# Patient Record
Sex: Male | Born: 1965 | Race: White | Hispanic: No | Marital: Married | Smoking: Former smoker
Health system: Southern US, Community
[De-identification: ages and names within clinical notes are randomized; demographics above are authoritative.]

## PROBLEM LIST (undated history)

## (undated) DIAGNOSIS — G2581 Restless legs syndrome: Secondary | ICD-10-CM

## (undated) DIAGNOSIS — G4730 Sleep apnea, unspecified: Secondary | ICD-10-CM

## (undated) DIAGNOSIS — N400 Enlarged prostate without lower urinary tract symptoms: Secondary | ICD-10-CM

## (undated) DIAGNOSIS — G629 Polyneuropathy, unspecified: Secondary | ICD-10-CM

## (undated) DIAGNOSIS — K219 Gastro-esophageal reflux disease without esophagitis: Secondary | ICD-10-CM

## (undated) DIAGNOSIS — I10 Essential (primary) hypertension: Secondary | ICD-10-CM

## (undated) DIAGNOSIS — R251 Tremor, unspecified: Secondary | ICD-10-CM

## (undated) HISTORY — DX: Sleep apnea, unspecified: G47.30

## (undated) HISTORY — DX: Benign prostatic hyperplasia without lower urinary tract symptoms: N40.0

## (undated) HISTORY — DX: Tremor, unspecified: R25.1

## (undated) HISTORY — DX: Polyneuropathy, unspecified: G62.9

## (undated) HISTORY — DX: Restless legs syndrome: G25.81

## (undated) HISTORY — DX: Essential (primary) hypertension: I10

## (undated) HISTORY — PX: HERNIA REPAIR: SHX51

## (undated) HISTORY — DX: Gastro-esophageal reflux disease without esophagitis: K21.9

---

## 1997-11-12 ENCOUNTER — Emergency Department (HOSPITAL_COMMUNITY): Admission: EM | Admit: 1997-11-12 | Discharge: 1997-11-12 | Payer: Self-pay | Admitting: Emergency Medicine

## 2001-01-26 ENCOUNTER — Ambulatory Visit (HOSPITAL_COMMUNITY): Admission: RE | Admit: 2001-01-26 | Discharge: 2001-01-26 | Payer: Self-pay | Admitting: Internal Medicine

## 2001-01-26 ENCOUNTER — Encounter: Payer: Self-pay | Admitting: Internal Medicine

## 2001-02-07 ENCOUNTER — Ambulatory Visit: Admission: RE | Admit: 2001-02-07 | Discharge: 2001-02-07 | Payer: Self-pay | Admitting: Internal Medicine

## 2001-05-26 ENCOUNTER — Ambulatory Visit (HOSPITAL_COMMUNITY): Admission: RE | Admit: 2001-05-26 | Discharge: 2001-05-26 | Payer: Self-pay | Admitting: Internal Medicine

## 2002-02-12 ENCOUNTER — Encounter: Payer: Self-pay | Admitting: Internal Medicine

## 2002-02-12 ENCOUNTER — Inpatient Hospital Stay (HOSPITAL_COMMUNITY): Admission: AD | Admit: 2002-02-12 | Discharge: 2002-02-13 | Payer: Self-pay | Admitting: Internal Medicine

## 2002-04-24 ENCOUNTER — Encounter: Payer: Self-pay | Admitting: Urology

## 2002-04-24 ENCOUNTER — Ambulatory Visit (HOSPITAL_COMMUNITY): Admission: RE | Admit: 2002-04-24 | Discharge: 2002-04-24 | Payer: Self-pay | Admitting: Urology

## 2003-03-25 ENCOUNTER — Other Ambulatory Visit: Admission: RE | Admit: 2003-03-25 | Discharge: 2003-03-25 | Payer: Self-pay | Admitting: Family Medicine

## 2006-01-04 ENCOUNTER — Emergency Department (HOSPITAL_COMMUNITY): Admission: EM | Admit: 2006-01-04 | Discharge: 2006-01-04 | Payer: Self-pay | Admitting: Emergency Medicine

## 2010-07-23 ENCOUNTER — Encounter: Payer: Self-pay | Admitting: Family Medicine

## 2018-10-03 ENCOUNTER — Ambulatory Visit (INDEPENDENT_AMBULATORY_CARE_PROVIDER_SITE_OTHER): Payer: Worker's Compensation

## 2018-10-03 ENCOUNTER — Ambulatory Visit
Admission: EM | Admit: 2018-10-03 | Discharge: 2018-10-03 | Disposition: A | Discharge status: Home / Self Care | Payer: Worker's Compensation | Attending: Family Medicine | Admitting: Family Medicine

## 2018-10-03 ENCOUNTER — Encounter: Payer: Self-pay | Admitting: Emergency Medicine

## 2018-10-03 ENCOUNTER — Other Ambulatory Visit: Payer: Self-pay

## 2018-10-03 DIAGNOSIS — Z23 Encounter for immunization: Secondary | ICD-10-CM | POA: Diagnosis not present

## 2018-10-03 DIAGNOSIS — M25531 Pain in right wrist: Secondary | ICD-10-CM | POA: Diagnosis not present

## 2018-10-03 DIAGNOSIS — M79641 Pain in right hand: Secondary | ICD-10-CM

## 2018-10-03 DIAGNOSIS — S61411A Laceration without foreign body of right hand, initial encounter: Secondary | ICD-10-CM | POA: Diagnosis not present

## 2018-10-03 DIAGNOSIS — S6991XA Unspecified injury of right wrist, hand and finger(s), initial encounter: Secondary | ICD-10-CM | POA: Diagnosis not present

## 2018-10-03 MED ORDER — MELOXICAM 15 MG PO TABS: 15.0000 mg | ORAL_TABLET | Freq: Every day | ORAL | 0 refills | Status: DC | PRN

## 2018-10-03 MED ORDER — TETANUS-DIPHTH-ACELL PERTUSSIS 5-2.5-18.5 LF-MCG/0.5 IM SUSP
0.5000 mL | Freq: Once | INTRAMUSCULAR | Status: AC
  Administered 2018-10-03: 0.5 mL via INTRAMUSCULAR

## 2018-10-03 NOTE — ED Triage Notes (Signed)
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  Patient has a skin tear to his right wrist.  Patient c/o pain in his right wrist and right hand.

## 2018-10-03 NOTE — ED Provider Notes (Signed)
[facility]    CSN: [id number] Arrival date & time: 10/03/18  0807  History   Chief Complaint Chief Complaint  Patient presents with  . Extremity Laceration  . Worker's Comp Injury   HPI  53 year old male presents with an injury to his right hand.  Patient works on a chicken farm.  Patient states that he much appreciated on today after tripping over a chicken feeder.  Patient fell injuring his right hand.  He has a small skin tear to the hand.  Patient complains of hand pain as well as wrist pain.  Particularly on the radial back.  Worse with range of motion.  No relieving factors.  Patient is unsure of his last tetanus.  Bleeding is well controlled at this time.  No other associated symptoms.  No other complaints.  History reviewed as below. PMH: Obesity  Past Surgical History:  Procedure Laterality Date  . HERNIA REPAIR     Home Medications    Prior to Admission medications   Medication Sig Start Date End Date Taking? Authorizing Provider  meloxicam (MOBIC) 15 MG tablet Take 1 tablet (15 mg total) by mouth daily as needed for pain. 10/03/18   Tommie Sams, DO   Social History Social History   Tobacco Use  . Smoking status: Former Games developer  . Smokeless tobacco: Never Used  Substance Use Topics  . Alcohol use: Never    Frequency: Never  . Drug use: Never    Allergies   Patient has no known allergies.   Review of Systems Review of Systems  Musculoskeletal:       Hand and wrist pain.   Skin:       Skin tear.   Physical Exam Triage Vital Signs ED Triage Vitals  Enc Vitals Group     BP 10/03/18 0825 (!) 127/91     Pulse Rate 10/03/18 0825 62     Resp 10/03/18 0825 16     Temp 10/03/18 0825 98 F (36.7 C)     Temp Source 10/03/18 0825 Oral     SpO2 10/03/18 0825 99 %     Weight 10/03/18 0822 270 lb (122.5 kg)     Height 10/03/18 0822 5' 11.5" (1.816 m)     Head Circumference --      Peak Flow --      Pain Score 10/03/18 0822 8     Pain Loc --       Pain Edu? --      Excl. in GC? --    Updated Vital Signs BP (!) 127/91 (BP Location: Left Arm)   Pulse 62   Temp 98 F (36.7 C) (Oral)   Resp 16   Ht 5' 11.5" (1.816 m)   Wt 122.5 kg   SpO2 99%   BMI 37.13 kg/m   Visual Acuity Right Eye Distance:   Left Eye Distance:   Bilateral Distance:    Right Eye Near:   Left Eye Near:    Bilateral Near:     Physical Exam Vitals signs and nursing note reviewed.  Constitutional:      General: He is not in acute distress.    Appearance: Normal appearance. He is obese.  HENT:     Head: Normocephalic and atraumatic.  Eyes:     General:        Right eye: No discharge.        Left eye: No discharge.     Conjunctiva/sclera: Conjunctivae normal.  Cardiovascular:  Rate and Rhythm: Normal rate and regular rhythm.  Pulmonary:     Effort: Pulmonary effort is normal.     Breath sounds: Normal breath sounds.  Musculoskeletal:     Comments: Right hand -mild swelling and tenderness over the second and third MCP.  Right wrist -tenderness and mild swelling on the radial aspect.  Skin:    Comments: Small skin tear noted on the dorsum of the right hand.  Neurological:     Mental Status: He is alert.  Psychiatric:        Mood and Affect: Mood normal.        Behavior: Behavior normal.    UC Treatments / Results  Labs (all labs ordered are listed, but only abnormal results are displayed) Labs Reviewed - No data to display  EKG None  Radiology Dg Wrist Complete Right  Result Date: 10/03/2018 CLINICAL DATA:  53 year old male with a history fall EXAM: RIGHT WRIST - COMPLETE 3+ VIEW COMPARISON:  No prior FINDINGS: Vague calcific density adjacent to the distal radius in a region of soft tissue swelling. No ulnar fracture identified. No radiopaque foreign body. Unremarkable scaphoid. IMPRESSION: Questionable avulsion fracture/chip fracture at the distal radius with associated soft tissue swelling. Electronically Signed   By: Gilmer Mor D.O.   On: 10/03/2018 08:57   Dg Hand Complete Right  Result Date: 10/03/2018 CLINICAL DATA:  Fall today with right hand injury EXAM: RIGHT HAND - COMPLETE 3+ VIEW COMPARISON:  None. FINDINGS: Soft tissue swelling in the radial side right wrist. No fracture or dislocation in the right hand. No suspicious focal osseous lesions. No significant arthropathy. No radiopaque foreign body. IMPRESSION: Right wrist radial side soft tissue swelling. No fracture or dislocation in the right hand. Electronically Signed   By: Delbert Phenix M.D.   On: 10/03/2018 09:00    Procedures Procedures (including critical care time)  Medications Ordered in UC Medications  Tdap (BOOSTRIX) injection 0.5 mL (0.5 mLs Intramuscular Given 10/03/18 9244)    Initial Impression / Assessment and Plan / UC Course  I have reviewed the triage vital signs and the nursing notes.  Pertinent labs & imaging results that were available during my care of the patient were reviewed by me and considered in my medical decision making (see chart for details).    53 year old male presents following an injury at work today.  Regarding his skin tear, the wound was dressed.  No need for further intervention.  X-rays of the hand and wrist revealed a possible avulsion fracture of the distal radius.  Placed in a brace.  Treating with meloxicam.  Advised rest, ice, elevation.  Discussed seeing orthopedist.  Workmen's Comp. form filled out.  Out of work today.  Final Clinical Impressions(s) / UC Diagnoses   Final diagnoses:  Injury of right wrist, initial encounter  Right wrist pain  Right hand pain  Skin tear of right hand without complication, initial encounter     Discharge Instructions     Rest, ice, elevation.  Wrist brace.  Medication as prescribed.  See Orthopedics on Monday ([facility] or [place]).  Take care  Dr. Adriana Simas     ED Prescriptions    Medication Sig Dispense Auth. Provider   meloxicam (MOBIC) 15 MG  tablet Take 1 tablet (15 mg total) by mouth daily as needed for pain. 30 tablet Tommie Sams, DO     Controlled Substance Prescriptions [place] Controlled Substance Registry consulted? Not Applicable   Tommie Sams, DO 10/03/18  0928  

## 2018-10-03 NOTE — Discharge Instructions (Signed)
Rest, ice, elevation.  Wrist brace.  Medication as prescribed.  See Orthopedics on Monday ([facility] or [place]).  Take care  Dr. Adriana Simas

## 2019-07-29 ENCOUNTER — Other Ambulatory Visit: Payer: Self-pay

## 2019-07-30 ENCOUNTER — Ambulatory Visit (INDEPENDENT_AMBULATORY_CARE_PROVIDER_SITE_OTHER): Payer: 59 | Admitting: Family Medicine

## 2019-07-30 ENCOUNTER — Encounter: Payer: Self-pay | Admitting: Family Medicine

## 2019-07-30 VITALS — BP 140/92 | HR 71 | Temp 98.6°F | Wt 315.4 lb

## 2019-07-30 DIAGNOSIS — R6889 Other general symptoms and signs: Secondary | ICD-10-CM

## 2019-07-30 DIAGNOSIS — Z23 Encounter for immunization: Secondary | ICD-10-CM

## 2019-07-30 DIAGNOSIS — G629 Polyneuropathy, unspecified: Secondary | ICD-10-CM

## 2019-07-30 DIAGNOSIS — Z1211 Encounter for screening for malignant neoplasm of colon: Secondary | ICD-10-CM

## 2019-07-30 DIAGNOSIS — G2581 Restless legs syndrome: Secondary | ICD-10-CM

## 2019-07-30 DIAGNOSIS — I10 Essential (primary) hypertension: Secondary | ICD-10-CM | POA: Diagnosis not present

## 2019-07-30 DIAGNOSIS — Z9989 Dependence on other enabling machines and devices: Secondary | ICD-10-CM | POA: Insufficient documentation

## 2019-07-30 DIAGNOSIS — R519 Headache, unspecified: Secondary | ICD-10-CM

## 2019-07-30 DIAGNOSIS — Z130 Encounter for screening for diseases of the blood and blood-forming organs and certain disorders involving the immune mechanism: Secondary | ICD-10-CM

## 2019-07-30 DIAGNOSIS — G4733 Obstructive sleep apnea (adult) (pediatric): Secondary | ICD-10-CM

## 2019-07-30 DIAGNOSIS — N400 Enlarged prostate without lower urinary tract symptoms: Secondary | ICD-10-CM | POA: Diagnosis not present

## 2019-07-30 DIAGNOSIS — G4730 Sleep apnea, unspecified: Secondary | ICD-10-CM | POA: Insufficient documentation

## 2019-07-30 DIAGNOSIS — Z13228 Encounter for screening for other metabolic disorders: Secondary | ICD-10-CM

## 2019-07-30 DIAGNOSIS — R32 Unspecified urinary incontinence: Secondary | ICD-10-CM

## 2019-07-30 DIAGNOSIS — K5900 Constipation, unspecified: Secondary | ICD-10-CM

## 2019-07-30 DIAGNOSIS — K219 Gastro-esophageal reflux disease without esophagitis: Secondary | ICD-10-CM

## 2019-07-30 MED ORDER — PRAMIPEXOLE DIHYDROCHLORIDE 0.25 MG PO TABS: 0.2500 mg | ORAL_TABLET | Freq: Every evening | ORAL | 2 refills | Status: DC

## 2019-07-30 MED ORDER — HYDROCHLOROTHIAZIDE 25 MG PO TABS: 25.0000 mg | ORAL_TABLET | Freq: Every day | ORAL | 2 refills | Status: DC

## 2019-07-30 MED ORDER — SHINGRIX 50 MCG/0.5ML IM SUSR: 0.5000 mL | Freq: Once | INTRAMUSCULAR | 0 refills | Status: AC

## 2019-07-30 NOTE — Patient Instructions (Addendum)
Mirapex 0.125 mg (half tablet) x1 week, then if needed increase to 0.25 mg (1 tablet) x1 week, then if needed, increase to 0.5 mg (2 tablets)   GoodRx for prescription assistance.    DASH Eating Plan DASH stands for "Dietary Approaches to Stop Hypertension." The DASH eating plan is a healthy eating plan that has been shown to reduce high blood pressure (hypertension). It may also reduce your risk for type 2 diabetes, heart disease, and stroke. The DASH eating plan may also help with weight loss. What are tips for following this plan?  General guidelines  Avoid eating more than 2,300 mg (milligrams) of salt (sodium) a day. If you have hypertension, you may need to reduce your sodium intake to 1,500 mg a day.  Limit alcohol intake to no more than 1 drink a day for nonpregnant women and 2 drinks a day for men. One drink equals 12 oz of beer, 5 oz of wine, or 1 oz of hard liquor.  Work with your health care provider to maintain a healthy body weight or to lose weight. Ask what an ideal weight is for you.  Get at least 30 minutes of exercise that causes your heart to beat faster (aerobic exercise) most days of the week. Activities may include walking, swimming, or biking.  Work with your health care provider or diet and nutrition specialist (dietitian) to adjust your eating plan to your individual calorie needs. Reading food labels   Check food labels for the amount of sodium per serving. Choose foods with less than 5 percent of the Daily Value of sodium. Generally, foods with less than 300 mg of sodium per serving fit into this eating plan.  To find whole grains, look for the word "whole" as the first word in the ingredient list. Shopping  Buy products labeled as "low-sodium" or "no salt added."  Buy fresh foods. Avoid canned foods and premade or frozen meals. Cooking  Avoid adding salt when cooking. Use salt-free seasonings or herbs instead of table salt or sea salt. Check with your  health care provider or pharmacist before using salt substitutes.  Do not fry foods. Cook foods using healthy methods such as baking, boiling, grilling, and broiling instead.  Cook with heart-healthy oils, such as olive, canola, soybean, or sunflower oil. Meal planning  Eat a balanced diet that includes: ? 5 or more servings of fruits and vegetables each day. At each meal, try to fill half of your plate with fruits and vegetables. ? Up to 6-8 servings of whole grains each day. ? Less than 6 oz of lean meat, poultry, or fish each day. A 3-oz serving of meat is about the same size as a deck of cards. One egg equals 1 oz. ? 2 servings of low-fat dairy each day. ? A serving of nuts, seeds, or beans 5 times each week. ? Heart-healthy fats. Healthy fats called Omega-3 fatty acids are found in foods such as flaxseeds and coldwater fish, like sardines, salmon, and mackerel.  Limit how much you eat of the following: ? Canned or prepackaged foods. ? Food that is high in trans fat, such as fried foods. ? Food that is high in saturated fat, such as fatty meat. ? Sweets, desserts, sugary drinks, and other foods with added sugar. ? Full-fat dairy products.  Do not salt foods before eating.  Try to eat at least 2 vegetarian meals each week.  Eat more home-cooked food and less restaurant, buffet, and fast food.  When  eating at a restaurant, ask that your food be prepared with less salt or no salt, if possible. What foods are recommended? The items listed may not be a complete list. Talk with your dietitian about what dietary choices are best for you. Grains Whole-grain or whole-wheat bread. Whole-grain or whole-wheat pasta. Brown rice. [place]. Bulgur. Whole-grain and low-sodium cereals. Pita bread. Low-fat, low-sodium crackers. Whole-wheat flour tortillas. Vegetables Fresh or frozen vegetables (raw, steamed, roasted, or grilled). Low-sodium or reduced-sodium tomato and vegetable juice.  Low-sodium or reduced-sodium tomato sauce and tomato paste. Low-sodium or reduced-sodium canned vegetables. Fruits All fresh, dried, or frozen fruit. Canned fruit in natural juice (without added sugar). Meat and other protein foods Skinless chicken or [place]. Ground chicken or [place]. Pork with fat trimmed off. Fish and seafood. Egg whites. Dried beans, peas, or lentils. Unsalted nuts, nut butters, and seeds. Unsalted canned beans. Lean cuts of beef with fat trimmed off. Low-sodium, lean deli meat. Dairy Low-fat (1%) or fat-free (skim) milk. Fat-free, low-fat, or reduced-fat cheeses. Nonfat, low-sodium ricotta or cottage cheese. Low-fat or nonfat yogurt. Low-fat, low-sodium cheese. Fats and oils Soft margarine without trans fats. Vegetable oil. Low-fat, reduced-fat, or light mayonnaise and salad dressings (reduced-sodium). Canola, safflower, olive, soybean, and sunflower oils. Avocado. Seasoning and other foods Herbs. Spices. Seasoning mixes without salt. Unsalted popcorn and pretzels. Fat-free sweets. What foods are not recommended? The items listed may not be a complete list. Talk with your dietitian about what dietary choices are best for you. Grains Baked goods made with fat, such as croissants, muffins, or some breads. Dry pasta or rice meal packs. Vegetables Creamed or fried vegetables. Vegetables in a cheese sauce. Regular canned vegetables (not low-sodium or reduced-sodium). Regular canned tomato sauce and paste (not low-sodium or reduced-sodium). Regular tomato and vegetable juice (not low-sodium or reduced-sodium). Angie Fava. Olives. Fruits Canned fruit in a light or heavy syrup. Fried fruit. Fruit in cream or butter sauce. Meat and other protein foods Fatty cuts of meat. Ribs. Fried meat. Berniece Salines. Sausage. Bologna and other processed lunch meats. Salami. Fatback. Hotdogs. Bratwurst. Salted nuts and seeds. Canned beans with added salt. Canned or smoked fish. Whole eggs or egg yolks. Chicken  or [place] with skin. Dairy Whole or 2% milk, cream, and half-and-half. Whole or full-fat cream cheese. Whole-fat or sweetened yogurt. Full-fat cheese. Nondairy creamers. Whipped toppings. Processed cheese and cheese spreads. Fats and oils Butter. Stick margarine. Lard. Shortening. Ghee. Bacon fat. Tropical oils, such as coconut, palm kernel, or palm oil. Seasoning and other foods Salted popcorn and pretzels. Onion salt, garlic salt, seasoned salt, table salt, and sea salt. Worcestershire sauce. Tartar sauce. Barbecue sauce. Teriyaki sauce. Soy sauce, including reduced-sodium. Steak sauce. Canned and packaged gravies. Fish sauce. Oyster sauce. Cocktail sauce. Horseradish that you find on the shelf. Ketchup. Mustard. Meat flavorings and tenderizers. Bouillon cubes. Hot sauce and Tabasco sauce. Premade or packaged marinades. Premade or packaged taco seasonings. Relishes. Regular salad dressings. Where to find more information:  National Heart, Lung, and [place]: https://wilson-eaton.com/  American Heart Association: www.heart.org Summary  The DASH eating plan is a healthy eating plan that has been shown to reduce high blood pressure (hypertension). It may also reduce your risk for type 2 diabetes, heart disease, and stroke.  With the DASH eating plan, you should limit salt (sodium) intake to 2,300 mg a day. If you have hypertension, you may need to reduce your sodium intake to 1,500 mg a day.  When on the DASH eating plan, aim  to eat more fresh fruits and vegetables, whole grains, lean proteins, low-fat dairy, and heart-healthy fats.  Work with your health care provider or diet and nutrition specialist (dietitian) to adjust your eating plan to your individual calorie needs. This information is not intended to replace advice given to you by your health care provider. Make sure you discuss any questions you have with your health care provider. Document Revised: 05/31/2017 Document Reviewed:  06/11/2016 Elsevier Patient Education  2020 Elsevier Inc.   Restless Legs Syndrome Restless legs syndrome is a condition that causes uncomfortable feelings or sensations in the legs, especially while sitting or lying down. The sensations usually cause an overwhelming urge to move the legs. The arms can also sometimes be affected. The condition can range from mild to severe. The symptoms often interfere with a person's ability to sleep. What are the causes? The cause of this condition is not known. What increases the risk? The following factors may make you more likely to develop this condition:  Being older than 50.  Pregnancy.  Being a woman. In general, the condition is more common in women than in men.  A family history of the condition.  Having iron deficiency.  Overuse of caffeine, nicotine, or alcohol.  Certain medical conditions, such as kidney disease, Parkinson's disease, or nerve damage.  Certain medicines, such as those for high blood pressure, nausea, colds, allergies, depression, and some heart conditions. What are the signs or symptoms? The main symptom of this condition is uncomfortable sensations in the legs, such as:  Pulling.  Tingling.  Prickling.  Throbbing.  Crawling.  Burning. Usually, the sensations:  Affect both sides of the body.  Are worse when you sit or lie down.  Are worse at night. These may wake you up or make it difficult to fall asleep.  Make you have a strong urge to move your legs.  Are temporarily relieved by moving your legs. The arms can also be affected, but this is rare. People who have this condition often have tiredness during the day because of their lack of sleep at night. How is this diagnosed? This condition may be diagnosed based on:  Your symptoms.  Blood tests. In some cases, you may be monitored in a sleep lab by a specialist (a sleep study). This can detect any disruptions in your sleep. How is this  treated? This condition is treated by managing the symptoms. This may include:  Lifestyle changes, such as exercising, using relaxation techniques, and avoiding caffeine, alcohol, or tobacco.  Medicines. Anti-seizure medicines may be tried first. Follow these instructions at home:     General instructions  Take over-the-counter and prescription medicines only as told by your health care provider.  Use methods to help relieve the uncomfortable sensations, such as: ? Massaging your legs. ? Walking or stretching. ? Taking a cold or hot bath.  Keep all follow-up visits as told by your health care provider. This is important. Lifestyle  Practice good sleep habits. For example, go to bed and get up at the same time every day. Most adults should get 7-9 hours of sleep each night.  Exercise regularly. Try to get at least 30 minutes of exercise most days of the week.  Practice ways of relaxing, such as yoga or meditation.  Avoid caffeine and alcohol.  Do not use any products that contain nicotine or tobacco, such as cigarettes and e-cigarettes. If you need help quitting, ask your health care provider. Contact a health care provider  if:  Your symptoms get worse or they do not improve with treatment. Summary  Restless legs syndrome is a condition that causes uncomfortable feelings or sensations in the legs, especially while sitting or lying down.  The symptoms often interfere with a person's ability to sleep.  This condition is treated by managing the symptoms. You may need to make lifestyle changes or take medicines. This information is not intended to replace advice given to you by your health care provider. Make sure you discuss any questions you have with your health care provider. Document Revised: 07/08/2017 Document Reviewed: 07/08/2017 Elsevier Patient Education  [place].

## 2019-07-30 NOTE — Progress Notes (Signed)
New Patient Office Visit  Assessment & Plan:  1. Restless leg - Uncontrolled. Starting patient on Mirapex 0.125 mg at bedtime. He may increase to 0.25 mg after one week if needed, and then up to 0.5 mg after another week if needed. This was printed on his AVS for him.  Education provided on restless leg syndrome. - pramipexole (MIRAPEX) 0.25 MG tablet; Take 1 tablet (0.25 mg total) by mouth every evening.  Dispense: 30 tablet; Refill: 2  2. Essential hypertension - Mildly elevated but patient has a lot of swelling. Starting him on HCTZ to decrease fluid and BP. Encouraged low salt. Education provided on the DASH diet. - hydrochlorothiazide (HYDRODIURIL) 25 MG tablet; Take 1 tablet (25 mg total) by mouth daily.  Dispense: 30 tablet; Refill: 2  3. Neuropathy - Will address at another visit as patient has multiple complaints today.  - CMP14+EGFR  4. Enlarged prostate - PSA, total and free  5. Obstructive sleep apnea syndrome - Controlled with use of CPAP.   6. Morbid obesity ([place]) - Encouraged diet and exercise.  - CBC with Differential/Platelet - CMP14+EGFR - Lipid panel  7. Colon cancer screening - Cologuard  8. Screening for deficiency anemia - CBC with Differential/Platelet  9. Screening for metabolic disorder - WOE32+ZYYQ  10. Immunization due - SHINGRIX injection; Inject 0.5 mLs into the muscle once for 1 dose.  Dispense: 0.5 mL; Refill: 0 - sent to pharmacy for administration.   11. Need for immunization against influenza - Flu Vaccine QUAD 36+ mos IM - given in office.   12. Multiple complaints - Advised we cannot address all complaints on the first visit.   13. Morning headache - Will address at another visit as patient has multiple complaints today.   14. Urinary incontinence, unspecified type - Will address at another visit as patient has multiple complaints today.   15. Constipation, unspecified constipation type - Patient to continue current probiotic  since it is working for him. Encouraged him to increase water intake.   16. Gastroesophageal reflux disease, unspecified whether esophagitis present - Continue famotidine 40 mg BID. Encouraged dietary adjustments and weight loss.  - famotidine (PEPCID) 20 MG tablet; Take 40 mg by mouth 2 (two) times daily.   Follow-up: Return in about 6 weeks (around 09/10/2019) for RLS.   Hendricks Limes, MSN, APRN, FNP-C [facility]  Subjective:  Patient ID: Jesse Castillo, Jesse Castillo    DOB: 08-19-1965  Age: 54 y.o. MRN: [id number]  Patient Care Team: Loman Brooklyn, FNP as PCP - General (Family Medicine)  CC:  Chief Complaint  Patient presents with  . New Patient (Initial Visit)    last 2 years was in prison. Restless legs he use to be on meds. right foot goes numb was diagnosed with neuropothy in prison, strength in hands  . Gastroesophageal Reflux  . Insomnia    HPI Jesse Castillo presents to establish care. Patient reports he used to see Dr. Laverta Baltimore here many years ago but he has been in prison for the past 12-1/2 years.    Patient's biggest concern is his restless leg syndrome.  He reports he used to be on medication but does not recall what it was called.  Reports his legs just will not allow him to sit still when he gets home from work in the evening.  Due to his legs he also does not sleep well.  He does have sleep apnea and wears his CPAP nightly.  Patient  reports he was diagnosed with neuropathy by a neurologist while he was in prison.  Reports numbness to the outside of his right foot, right great toe, and the bottom of his right foot.  Also reports a stabbing pain in the arch of his right foot at times.  Reports waking up with a dull headache every morning.  Concerned about swelling in his hands and feet.  Reports swelling get so bad in his hands that he cannot even ball a fist.  Hands are also very stiff in the morning.  Patient reports he eats very little salt but then  endorses using half salt substitute, and eating things with high salt content such as chips and peanuts.  He does not buy low-sodium foods.  Concerned that he leaks urine during the day.  He feels he is unable to completely empty his bladder.  Also struggles with constipation.  In the past he has failed therapy with MiraLAX, fiber tablets, and Metamucil.  He is currently taking a "colon cleanser" which he reports is working well for him.  Patient also has GERD which he feels is somewhat controlled with famotidine 40 mg twice daily.  Patient indulges in food on a regular basis as he reports it is a huge change from being only able to eat small portions of nasty food in prison.  Patient does check his blood pressure at home with a wrist cuff and reports he gets around 138/80s.   Review of Systems  Constitutional: Negative for chills, fever, malaise/fatigue and weight loss.  HENT: Negative for congestion, ear discharge, ear pain, nosebleeds, sinus pain, sore throat and tinnitus.   Eyes: Negative for blurred vision, double vision, pain, discharge and redness.  Respiratory: Positive for shortness of breath (with climbing stairs). Negative for cough and wheezing.   Cardiovascular: Positive for leg swelling. Negative for chest pain and palpitations.  Gastrointestinal: Positive for constipation and heartburn. Negative for abdominal pain, diarrhea, nausea and vomiting.  Genitourinary: Negative for dysuria, frequency and urgency.  Musculoskeletal: Negative for myalgias.  Skin: Negative for rash.  Neurological: Positive for headaches. Negative for dizziness, seizures and weakness.  Psychiatric/Behavioral: Negative for depression, substance abuse and suicidal ideas. The patient is not nervous/anxious.     Current Outpatient Medications:  .  aspirin EC 81 MG tablet, Take 81 mg by mouth daily., Disp: , Rfl:  .  famotidine (PEPCID) 20 MG tablet, Take 40 mg by mouth 2 (two) times daily., Disp: , Rfl:  .   Misc Natural Products (URINOZINC PLUS PO), Take by mouth., Disp: , Rfl:  .  Multiple Vitamins-Minerals (MULTIVITAMIN MEN PO), Take by mouth., Disp: , Rfl:  .  Probiotic Product (HEALTHY COLON PO), Take by mouth., Disp: , Rfl:  .  Specialty Vitamins Products (ECHINACEA C COMPLETE PO), Take by mouth., Disp: , Rfl:  .  hydrochlorothiazide (HYDRODIURIL) 25 MG tablet, Take 1 tablet (25 mg total) by mouth daily., Disp: 30 tablet, Rfl: 2 .  pramipexole (MIRAPEX) 0.25 MG tablet, Take 1 tablet (0.25 mg total) by mouth every evening., Disp: 30 tablet, Rfl: 2  No Known Allergies  Past Medical History:  Diagnosis Date  . Enlarged prostate   . Neuropathy    RLE  . Restless leg   . Sleep apnea     Past Surgical History:  Procedure Laterality Date  . HERNIA REPAIR      Family History  Problem Relation Age of Onset  . Heart disease Mother   . Tremor Father   .  Hypertension Daughter   . Heart disease Maternal Grandmother   . Parkinson's disease Paternal Grandmother     Social History   Socioeconomic History  . Marital status: Married    Spouse name: Not on file  . Number of children: Not on file  . Years of education: Not on file  . Highest education level: Not on file  Occupational History  . Not on file  Tobacco Use  . Smoking status: Former Smoker    Quit date: 07/03/2007    Years since quitting: 12.0  . Smokeless tobacco: Never Used  Substance and Sexual Activity  . Alcohol use: Never  . Drug use: Never  . Sexual activity: Not on file  Other Topics Concern  . Not on file  Social History Narrative  . Not on file   Social Determinants of Health   Financial Resource Strain:   . Difficulty of Paying Living Expenses: Not on file  Food Insecurity:   . Worried About Charity fundraiser in the Last Year: Not on file  . Ran Out of Food in the Last Year: Not on file  Transportation Needs:   . Lack of Transportation (Medical): Not on file  . Lack of Transportation  (Non-Medical): Not on file  Physical Activity:   . Days of Exercise per Week: Not on file  . Minutes of Exercise per Session: Not on file  Stress:   . Feeling of Stress : Not on file  Social Connections:   . Frequency of Communication with Friends and Family: Not on file  . Frequency of Social Gatherings with Friends and Family: Not on file  . Attends Religious Services: Not on file  . Active Member of Clubs or Organizations: Not on file  . Attends Archivist Meetings: Not on file  . Marital Status: Not on file  Intimate Partner Violence:   . Fear of Current or Ex-Partner: Not on file  . Emotionally Abused: Not on file  . Physically Abused: Not on file  . Sexually Abused: Not on file    Objective:   Today's Vitals: BP (!) 140/92   Pulse 71   Temp 98.6 F (37 C)   Wt (!) 315 lb 6.4 oz (143.1 kg)   SpO2 96%   BMI 43.38 kg/m   Physical Exam Vitals reviewed.  Constitutional:      General: He is not in acute distress.    Appearance: Normal appearance. He is morbidly obese. He is not ill-appearing, toxic-appearing or diaphoretic.  HENT:     Head: Normocephalic and atraumatic.  Eyes:     General: No scleral icterus.       Right eye: No discharge.        Left eye: No discharge.     Conjunctiva/sclera: Conjunctivae normal.  Cardiovascular:     Rate and Rhythm: Normal rate and regular rhythm.     Heart sounds: Normal heart sounds. No murmur. No friction rub. No gallop.   Pulmonary:     Effort: Pulmonary effort is normal. No respiratory distress.     Breath sounds: Normal breath sounds. No stridor. No wheezing, rhonchi or rales.  Musculoskeletal:        General: Normal range of motion.     Cervical back: Normal range of motion.  Skin:    General: Skin is warm and dry.  Neurological:     Mental Status: He is alert and oriented to person, place, and time. Mental status is at baseline.  Psychiatric:  Mood and Affect: Mood normal.        Behavior: Behavior  normal.        Thought Content: Thought content normal.        Judgment: Judgment normal.

## 2019-07-31 ENCOUNTER — Encounter: Payer: Self-pay | Admitting: Family Medicine

## 2019-07-31 LAB — CMP14+EGFR
ALT: 20 IU/L (ref 0–44)
AST: 27 IU/L (ref 0–40)
Albumin/Globulin Ratio: 1.7 (ref 1.2–2.2)
Albumin: 4.5 g/dL (ref 3.8–4.9)
Alkaline Phosphatase: 91 IU/L (ref 39–117)
BUN/Creatinine Ratio: 18 (ref 9–20)
BUN: 16 mg/dL (ref 6–24)
Bilirubin Total: 0.6 mg/dL (ref 0.0–1.2)
CO2: 25 mmol/L (ref 20–29)
Calcium: 9.7 mg/dL (ref 8.7–10.2)
Chloride: 104 mmol/L (ref 96–106)
Creatinine, Ser: 0.87 mg/dL (ref 0.76–1.27)
GFR calc Af Amer: 116 mL/min/{1.73_m2} (ref >59)
GFR calc non Af Amer: 100 mL/min/{1.73_m2} (ref >59)
Globulin, Total: 2.7 g/dL (ref 1.5–4.5)
Glucose: 97 mg/dL (ref 65–99)
Potassium: 4.5 mmol/L (ref 3.5–5.2)
Sodium: 140 mmol/L (ref 134–144)
Total Protein: 7.2 g/dL (ref 6.0–8.5)

## 2019-07-31 LAB — CBC WITH DIFFERENTIAL/PLATELET
Basophils Absolute: 0 x10E3/uL (ref 0.0–0.2)
Basos: 1 %
EOS (ABSOLUTE): 0.1 x10E3/uL (ref 0.0–0.4)
Eos: 1 %
Hematocrit: 44 % (ref 37.5–51.0)
Hemoglobin: 15.4 g/dL (ref 13.0–17.7)
Immature Grans (Abs): 0 x10E3/uL (ref 0.0–0.1)
Immature Granulocytes: 0 %
Lymphocytes Absolute: 1.6 x10E3/uL (ref 0.7–3.1)
Lymphs: 25 %
MCH: 31.7 pg (ref 26.6–33.0)
MCHC: 35 g/dL (ref 31.5–35.7)
MCV: 91 fL (ref 79–97)
Monocytes Absolute: 1 x10E3/uL — ABNORMAL HIGH (ref 0.1–0.9)
Monocytes: 16 %
Neutrophils Absolute: 3.6 x10E3/uL (ref 1.4–7.0)
Neutrophils: 57 %
Platelets: 254 x10E3/uL (ref 150–450)
RBC: 4.86 x10E6/uL (ref 4.14–5.80)
RDW: 12.6 % (ref 11.6–15.4)
WBC: 6.3 x10E3/uL (ref 3.4–10.8)

## 2019-07-31 LAB — LIPID PANEL
Chol/HDL Ratio: 3.3 ratio (ref 0.0–5.0)
Cholesterol, Total: 160 mg/dL (ref 100–199)
HDL: 49 mg/dL
LDL Chol Calc (NIH): 96 mg/dL (ref 0–99)
Triglycerides: 79 mg/dL (ref 0–149)
VLDL Cholesterol Cal: 15 mg/dL (ref 5–40)

## 2019-07-31 LAB — PSA, TOTAL AND FREE
PSA, Free Pct: 22.5 %
PSA, Free: 0.18 ng/mL
Prostate Specific Ag, Serum: 0.8 ng/mL (ref 0.0–4.0)

## 2019-08-11 ENCOUNTER — Encounter: Payer: Self-pay | Admitting: Family Medicine

## 2019-08-11 LAB — COLOGUARD: COLOGUARD: NEGATIVE

## 2019-08-17 ENCOUNTER — Encounter: Payer: Self-pay | Admitting: Family Medicine

## 2019-08-20 ENCOUNTER — Telehealth: Payer: Self-pay | Admitting: Family Medicine

## 2019-08-20 NOTE — Telephone Encounter (Signed)
Cologuard ordered on 07/30/2019.  I do not see where this has been entered into [employer]olog[facility]'s website.  Can we please follow-up on this?

## 2019-08-20 NOTE — Telephone Encounter (Signed)
Nevermind found it

## 2019-08-24 ENCOUNTER — Encounter: Payer: Self-pay | Admitting: Family Medicine

## 2019-08-24 DIAGNOSIS — G2581 Restless legs syndrome: Secondary | ICD-10-CM

## 2019-08-24 LAB — COLOGUARD: Cologuard: NEGATIVE

## 2019-08-25 ENCOUNTER — Encounter: Payer: Self-pay | Admitting: Family Medicine

## 2019-08-25 MED ORDER — ROPINIROLE HCL 0.5 MG PO TABS: ORAL_TABLET | ORAL | 0 refills | Status: DC

## 2019-08-26 ENCOUNTER — Encounter: Payer: Self-pay | Admitting: Family Medicine

## 2019-08-26 DIAGNOSIS — I10 Essential (primary) hypertension: Secondary | ICD-10-CM

## 2019-08-26 MED ORDER — HYDROCHLOROTHIAZIDE 25 MG PO TABS: 25.0000 mg | ORAL_TABLET | Freq: Every day | ORAL | 2 refills | Status: DC

## 2019-09-14 NOTE — Progress Notes (Signed)
Assessment & Plan:  1. Restless leg - Improving.  Increasing Requip from 0.5 mg to 1 mg at at bedtime. - rOPINIRole (REQUIP) 1 MG tablet; Take 1 tablet (1 mg total) by mouth at bedtime.  Dispense: 30 tablet; Refill: 2  2. Essential hypertension - Well controlled on current regimen.   3. Morbid obesity ([place]) - Discussed weight gain.  But we also discussed healthier food and snack options for him to try.  4. Erectile dysfunction, unspecified erectile dysfunction type - Testosterone,Free and Total; Future - TSH; Future  5. Arthritis - diclofenac (VOLTAREN) 75 MG EC tablet; Take 1 tablet (75 mg total) by mouth 2 (two) times daily.  Dispense: 60 tablet; Refill: 2  6. Shortness of breath - albuterol (VENTOLIN HFA) 108 (90 Base) MCG/ACT inhaler; Inhale 2 puffs into the lungs every 6 (six) hours as needed for wheezing or shortness of breath.  Dispense: 18 g; Refill: 2   Return in about 3 months (around 12/16/2019) for follow-up of chronic medication conditions.  Hendricks Limes, MSN, APRN, FNP-C [facility]  Subjective:    Patient ID: Jesse Castillo, male    DOB: [birth year], 54 y.o.   MRN: [id number]  Patient Care Team: Loman Brooklyn, FNP as PCP - General (Family Medicine)   Chief Complaint:  Chief Complaint  Patient presents with  . RLS    x 6 weeks. Patient states that he is sleeping a little better at night now.    HPI: Jesse Castillo is a 54 y.o. male presenting on 09/15/2019 for RLS (x 6 weeks. Patient states that he is sleeping a little better at night now.)  Patient did try Mirapex and felt it worked for some but was maxed out on it.  We switched him over to Requip a few weeks ago. He is currently on 0.5 mg and has seen an improvement but feels it could be just a little better.  Patient did bring a list of his blood pressure readings with him today.  He checks it daily.  His systolic ranges 875 -643 with 4 out of 30 > 140.  His diastolic ranges 79 -  93 with 6 out of 30 > 90.  His heart rate ranges 69 - 87.  New complaints: Patient reports he has a hard time keeping an erection.  He would like to know if there is anything that can be done about this.  Patient reports he is in need of a sleep study as he has not had one in 10+ years but does not wish to proceed with this at this time as his insurance does not cover specialist.  He also states he feels the pain in his feet are due to calluses.  He has been soaking his feet in trying to file them down.  Patient would like to know if there is something he can take for arthritis.  Also states he has exertional shortness of breath.  He has never had an inhaler before.  He is a former smoker.   Social history:  Relevant past medical, surgical, family and social history reviewed and updated as indicated. Interim medical history since our last visit reviewed.  Allergies and medications reviewed and updated.  DATA REVIEWED: CHART IN EPIC  ROS: Negative unless specifically indicated above in HPI.    Current Outpatient Medications:  .  aspirin EC 81 MG tablet, Take 81 mg by mouth daily., Disp: , Rfl:  .  famotidine (PEPCID) 20 MG tablet,  Take 40 mg by mouth 2 (two) times daily., Disp: , Rfl:  .  hydrochlorothiazide (HYDRODIURIL) 25 MG tablet, Take 1 tablet (25 mg total) by mouth daily., Disp: 30 tablet, Rfl: 2 .  Misc Natural Products (URINOZINC PLUS PO), Take by mouth., Disp: , Rfl:  .  Multiple Vitamins-Minerals (MULTIVITAMIN MEN PO), Take by mouth., Disp: , Rfl:  .  Probiotic Product (HEALTHY COLON PO), Take by mouth., Disp: , Rfl:  .  rOPINIRole (REQUIP) 1 MG tablet, Take 1 tablet (1 mg total) by mouth at bedtime., Disp: 30 tablet, Rfl: 2 .  Specialty Vitamins Products (ECHINACEA C COMPLETE PO), Take by mouth., Disp: , Rfl:  .  albuterol (VENTOLIN HFA) 108 (90 Base) MCG/ACT inhaler, Inhale 2 puffs into the lungs every 6 (six) hours as needed for wheezing or shortness of breath., Disp:  18 g, Rfl: 2 .  diclofenac (VOLTAREN) 75 MG EC tablet, Take 1 tablet (75 mg total) by mouth 2 (two) times daily., Disp: 60 tablet, Rfl: 2 .  Testosterone (ANDROGEL) 25 MG/2.5GM (1%) GEL, Place 4 Pump onto the skin every morning. Apply to clean, dry, intact skin of the shoulders and upper arms or abdomen., Disp: 5 g, Rfl: 2   Allergies  Allergen Reactions  . Mobic [Meloxicam] Other (See Comments)    Mouth sores   Past Medical History:  Diagnosis Date  . Enlarged prostate   . Neuropathy    RLE  . Restless leg   . Sleep apnea     Past Surgical History:  Procedure Laterality Date  . HERNIA REPAIR      Social History   Socioeconomic History  . Marital status: Married    Spouse name: Not on file  . Number of children: Not on file  . Years of education: Not on file  . Highest education level: Not on file  Occupational History  . Not on file  Tobacco Use  . Smoking status: Former Smoker    Quit date: 07/03/2007    Years since quitting: 12.2  . Smokeless tobacco: Never Used  Substance and Sexual Activity  . Alcohol use: Never  . Drug use: Never  . Sexual activity: Not on file  Other Topics Concern  . Not on file  Social History Narrative  . Not on file   Social Determinants of Health   Financial Resource Strain:   . Difficulty of Paying Living Expenses:   Food Insecurity:   . Worried About Programme researcher, broadcasting/film/video in the Last Year:   . Barista in the Last Year:   Transportation Needs:   . Freight forwarder (Medical):   Marland Kitchen Lack of Transportation (Non-Medical):   Physical Activity:   . Days of Exercise per Week:   . Minutes of Exercise per Session:   Stress:   . Feeling of Stress :   Social Connections:   . Frequency of Communication with Friends and Family:   . Frequency of Social Gatherings with Friends and Family:   . Attends Religious Services:   . Active Member of Clubs or Organizations:   . Attends Banker Meetings:   Marland Kitchen Marital Status:     Intimate Partner Violence:   . Fear of Current or Ex-Partner:   . Emotionally Abused:   Marland Kitchen Physically Abused:   . Sexually Abused:         Objective:    BP (!) 137/91   Pulse 64   Temp 98.2 F (36.8 C) (Temporal)  Ht 5' 11.5" (1.816 m)   Wt (!) 326 lb 12.8 oz (148.2 kg)   SpO2 97%   BMI 44.94 kg/m   Wt Readings from Last 3 Encounters:  09/15/19 (!) 326 lb 12.8 oz (148.2 kg)  07/30/19 (!) 315 lb 6.4 oz (143.1 kg)  10/03/18 270 lb (122.5 kg)    Physical Exam Vitals reviewed.  Constitutional:      General: He is not in acute distress.    Appearance: Normal appearance. He is morbidly obese. He is not ill-appearing, toxic-appearing or diaphoretic.  HENT:     Head: Normocephalic and atraumatic.  Eyes:     General: No scleral icterus.       Right eye: No discharge.        Left eye: No discharge.     Conjunctiva/sclera: Conjunctivae normal.  Cardiovascular:     Rate and Rhythm: Normal rate and regular rhythm.     Heart sounds: Normal heart sounds. No murmur. No friction rub. No gallop.   Pulmonary:     Effort: Pulmonary effort is normal. No respiratory distress.     Breath sounds: Normal breath sounds. No stridor. No wheezing, rhonchi or rales.  Musculoskeletal:        General: Normal range of motion.     Cervical back: Normal range of motion.  Skin:    General: Skin is warm and dry.  Neurological:     Mental Status: He is alert and oriented to person, place, and time. Mental status is at baseline.  Psychiatric:        Mood and Affect: Mood normal.        Behavior: Behavior normal.        Thought Content: Thought content normal.        Judgment: Judgment normal.     Lab Results  Component Value Date   TSH 3.370 09/16/2019   Lab Results  Component Value Date   WBC 6.3 07/30/2019   HGB 15.4 07/30/2019   HCT 44.0 07/30/2019   MCV 91 07/30/2019   PLT 254 07/30/2019   Lab Results  Component Value Date   NA 140 07/30/2019   K 4.5 07/30/2019   CO2 25  07/30/2019   GLUCOSE 97 07/30/2019   BUN 16 07/30/2019   CREATININE 0.87 07/30/2019   BILITOT 0.6 07/30/2019   ALKPHOS 91 07/30/2019   AST 27 07/30/2019   ALT 20 07/30/2019   PROT 7.2 07/30/2019   ALBUMIN 4.5 07/30/2019   CALCIUM 9.7 07/30/2019   Lab Results  Component Value Date   CHOL 160 07/30/2019   Lab Results  Component Value Date   HDL 49 07/30/2019   Lab Results  Component Value Date   LDLCALC 96 07/30/2019   Lab Results  Component Value Date   TRIG 79 07/30/2019   Lab Results  Component Value Date   CHOLHDL 3.3 07/30/2019   No results found for: HGBA1C

## 2019-09-15 ENCOUNTER — Other Ambulatory Visit: Payer: Self-pay

## 2019-09-15 ENCOUNTER — Encounter: Payer: Self-pay | Admitting: Family Medicine

## 2019-09-15 ENCOUNTER — Ambulatory Visit (INDEPENDENT_AMBULATORY_CARE_PROVIDER_SITE_OTHER): Payer: 59 | Admitting: Family Medicine

## 2019-09-15 VITALS — BP 137/91 | HR 64 | Temp 98.2°F | Ht 71.5 in | Wt 326.8 lb

## 2019-09-15 DIAGNOSIS — I10 Essential (primary) hypertension: Secondary | ICD-10-CM

## 2019-09-15 DIAGNOSIS — M1990 Unspecified osteoarthritis, unspecified site: Secondary | ICD-10-CM

## 2019-09-15 DIAGNOSIS — N529 Male erectile dysfunction, unspecified: Secondary | ICD-10-CM | POA: Diagnosis not present

## 2019-09-15 DIAGNOSIS — G2581 Restless legs syndrome: Secondary | ICD-10-CM

## 2019-09-15 DIAGNOSIS — R0602 Shortness of breath: Secondary | ICD-10-CM

## 2019-09-15 MED ORDER — ROPINIROLE HCL 1 MG PO TABS: 1.0000 mg | ORAL_TABLET | Freq: Every day | ORAL | 2 refills | Status: DC

## 2019-09-15 MED ORDER — ALBUTEROL SULFATE HFA 108 (90 BASE) MCG/ACT IN AERS: 2.0000 | INHALATION_SPRAY | Freq: Four times a day (QID) | RESPIRATORY_TRACT | 2 refills | Status: DC | PRN

## 2019-09-15 MED ORDER — DICLOFENAC SODIUM 75 MG PO TBEC: 75.0000 mg | DELAYED_RELEASE_TABLET | Freq: Two times a day (BID) | ORAL | 2 refills | Status: DC

## 2019-09-15 NOTE — Patient Instructions (Signed)
Miracle foot

## 2019-09-16 ENCOUNTER — Other Ambulatory Visit: Payer: Self-pay

## 2019-09-16 ENCOUNTER — Other Ambulatory Visit: Payer: 59

## 2019-09-16 DIAGNOSIS — N529 Male erectile dysfunction, unspecified: Secondary | ICD-10-CM

## 2019-09-18 ENCOUNTER — Other Ambulatory Visit: Payer: Self-pay | Admitting: Family Medicine

## 2019-09-18 DIAGNOSIS — R7989 Other specified abnormal findings of blood chemistry: Secondary | ICD-10-CM

## 2019-09-18 MED ORDER — TESTOSTERONE 25 MG/2.5GM (1%) TD GEL: 4.0000 | TRANSDERMAL | 2 refills | Status: DC

## 2019-09-19 ENCOUNTER — Encounter: Payer: Self-pay | Admitting: Family Medicine

## 2019-09-19 LAB — TSH: TSH: 3.37 u[IU]/mL (ref 0.450–4.500)

## 2019-09-19 LAB — TESTOSTERONE,FREE AND TOTAL
Testosterone, Free: 5.5 pg/mL — ABNORMAL LOW (ref 7.2–24.0)
Testosterone: 259 ng/dL — ABNORMAL LOW (ref 264–916)

## 2019-09-20 ENCOUNTER — Encounter: Payer: Self-pay | Admitting: Family Medicine

## 2019-09-20 DIAGNOSIS — M1990 Unspecified osteoarthritis, unspecified site: Secondary | ICD-10-CM | POA: Insufficient documentation

## 2019-09-20 DIAGNOSIS — I10 Essential (primary) hypertension: Secondary | ICD-10-CM | POA: Insufficient documentation

## 2019-09-21 ENCOUNTER — Encounter: Payer: Self-pay | Admitting: Family Medicine

## 2019-09-21 ENCOUNTER — Telehealth: Payer: Self-pay | Admitting: *Deleted

## 2019-09-21 DIAGNOSIS — R7989 Other specified abnormal findings of blood chemistry: Secondary | ICD-10-CM

## 2019-09-21 NOTE — Telephone Encounter (Signed)
Prior Auth for Testosterone 25 MG/2.5GM(1%) gel-In Process   Key: [id number]   PA Case ID: [id number]   This request is still being processed. You may close this dialog, return to your dashboard, and perform other tasks. To check for an update later, open this request again from your dashboard. If you have any questions, please contact [employer] at [phone].

## 2019-09-24 MED ORDER — TESTOSTERONE CYPIONATE 100 MG/ML IJ SOLN: 100.0000 mg | INTRAMUSCULAR | 2 refills | Status: DC

## 2019-09-24 NOTE — Telephone Encounter (Signed)
Prior Auth for Testosterone 25mg /2.5gm-DENIED  No reason was given

## 2019-09-24 NOTE — Telephone Encounter (Signed)
lmtcb

## 2019-09-24 NOTE — Telephone Encounter (Signed)
I changed it to an injection as it does not look like based on insurance formulary list they are going to cover a gel. He will need to schedule a nurse visit to have someone teach him how to administer.

## 2019-09-28 ENCOUNTER — Encounter: Payer: Self-pay | Admitting: Family Medicine

## 2019-09-30 ENCOUNTER — Encounter: Payer: Self-pay | Admitting: Family Medicine

## 2019-10-01 ENCOUNTER — Ambulatory Visit (INDEPENDENT_AMBULATORY_CARE_PROVIDER_SITE_OTHER): Payer: 59

## 2019-10-01 ENCOUNTER — Other Ambulatory Visit: Payer: Self-pay

## 2019-10-01 DIAGNOSIS — R7989 Other specified abnormal findings of blood chemistry: Secondary | ICD-10-CM

## 2019-10-01 MED ORDER — TESTOSTERONE CYPIONATE 100 MG/ML IM SOLN
100.0000 mg | INTRAMUSCULAR | Status: DC
  Administered 2019-10-01 – 2020-01-19 (×8): 100 mg via INTRAMUSCULAR

## 2019-10-01 NOTE — Telephone Encounter (Signed)
Refer to [employer] on 3/29- this encounter will be closed.

## 2019-10-01 NOTE — Progress Notes (Signed)
Testosterone injection given to left upper outer quadrant.  Patient tolerated well. 

## 2019-10-15 ENCOUNTER — Ambulatory Visit (INDEPENDENT_AMBULATORY_CARE_PROVIDER_SITE_OTHER): Payer: 59 | Admitting: Family Medicine

## 2019-10-15 ENCOUNTER — Other Ambulatory Visit: Payer: Self-pay

## 2019-10-15 DIAGNOSIS — R7989 Other specified abnormal findings of blood chemistry: Secondary | ICD-10-CM | POA: Diagnosis not present

## 2019-10-15 NOTE — Progress Notes (Signed)
Testosterone injection given. 

## 2019-10-29 ENCOUNTER — Other Ambulatory Visit: Payer: Self-pay

## 2019-10-29 ENCOUNTER — Ambulatory Visit (INDEPENDENT_AMBULATORY_CARE_PROVIDER_SITE_OTHER): Payer: 59 | Admitting: *Deleted

## 2019-10-29 DIAGNOSIS — R7989 Other specified abnormal findings of blood chemistry: Secondary | ICD-10-CM | POA: Diagnosis not present

## 2019-11-12 ENCOUNTER — Ambulatory Visit (INDEPENDENT_AMBULATORY_CARE_PROVIDER_SITE_OTHER): Payer: 59 | Admitting: *Deleted

## 2019-11-12 ENCOUNTER — Other Ambulatory Visit: Payer: Self-pay

## 2019-11-12 DIAGNOSIS — R7989 Other specified abnormal findings of blood chemistry: Secondary | ICD-10-CM | POA: Diagnosis not present

## 2019-11-23 ENCOUNTER — Other Ambulatory Visit: Payer: Self-pay | Admitting: Family Medicine

## 2019-11-23 DIAGNOSIS — I10 Essential (primary) hypertension: Secondary | ICD-10-CM

## 2019-11-26 ENCOUNTER — Other Ambulatory Visit: Payer: Self-pay

## 2019-11-26 ENCOUNTER — Ambulatory Visit (INDEPENDENT_AMBULATORY_CARE_PROVIDER_SITE_OTHER): Payer: 59 | Admitting: *Deleted

## 2019-11-26 DIAGNOSIS — R7989 Other specified abnormal findings of blood chemistry: Secondary | ICD-10-CM | POA: Diagnosis not present

## 2019-11-27 ENCOUNTER — Encounter: Payer: Self-pay | Admitting: Family Medicine

## 2019-11-27 DIAGNOSIS — G2581 Restless legs syndrome: Secondary | ICD-10-CM

## 2019-11-27 MED ORDER — ROPINIROLE HCL 1 MG PO TABS: 1.5000 mg | ORAL_TABLET | Freq: Every day | ORAL | 1 refills | Status: DC

## 2019-12-01 ENCOUNTER — Encounter: Payer: Self-pay | Admitting: Family Medicine

## 2019-12-02 ENCOUNTER — Encounter: Payer: Self-pay | Admitting: Family Medicine

## 2019-12-02 ENCOUNTER — Ambulatory Visit (INDEPENDENT_AMBULATORY_CARE_PROVIDER_SITE_OTHER): Payer: 59 | Admitting: Family Medicine

## 2019-12-02 ENCOUNTER — Other Ambulatory Visit: Payer: Self-pay

## 2019-12-02 VITALS — BP 132/79 | HR 70 | Temp 98.4°F | Ht 71.5 in | Wt 315.0 lb

## 2019-12-02 DIAGNOSIS — R079 Chest pain, unspecified: Secondary | ICD-10-CM | POA: Diagnosis not present

## 2019-12-02 DIAGNOSIS — K219 Gastro-esophageal reflux disease without esophagitis: Secondary | ICD-10-CM

## 2019-12-02 MED ORDER — ESOMEPRAZOLE MAGNESIUM 40 MG PO CPDR: 40.0000 mg | DELAYED_RELEASE_CAPSULE | Freq: Two times a day (BID) | ORAL | 3 refills | Status: DC

## 2019-12-02 NOTE — Progress Notes (Signed)
BP 132/79   Pulse 70   Temp 98.4 F (36.9 C)   Ht 5' 11.5" (1.816 m)   Wt (!) 315 lb (142.9 kg)   SpO2 98%   BMI 43.32 kg/m    Subjective:   Patient ID: Jesse Castillo, male    DOB: 1966/04/08, 54 y.o.   MRN: [id number]  HPI: Jesse Castillo is a 54 y.o. male presenting on 12/02/2019 for Chest Pain   HPI Patient comes in complaining of chest pain that he describes as a burning sharp chest pain on the left side of his chest in the center of the left side that sometimes goes up to his left shoulder.  He says he has been experiencing this especially over the past 3 weeks and it feels a lot similar to when he had GERD previously.  He says he tried to start taking some over-the-counter omeprazole and has been eating Tums and the Tums do help with a note last in the omeprazole he has been taking for 3 days twice a day at 20 mg and it has not been helping yet.  Patient denies any blood in his stool.  He denies any shortness of breath or chest pain on exertion.  He says is mostly at night and he will get a lot of belching and burping and is especially worse when he lays flat.  Relevant past medical, surgical, family and social history reviewed and updated as indicated. Interim medical history since our last visit reviewed. Allergies and medications reviewed and updated.  Review of Systems  Constitutional: Negative for chills and fever.  Respiratory: Negative for chest tightness, shortness of breath and wheezing.   Cardiovascular: Positive for chest pain. Negative for leg swelling.  Gastrointestinal: Positive for nausea. Negative for abdominal pain, blood in stool, constipation and vomiting.  Musculoskeletal: Negative for back pain and gait problem.  Skin: Negative for rash.  All other systems reviewed and are negative.   Per HPI unless specifically indicated above   Allergies as of 12/02/2019      Reactions   Mobic [meloxicam] Other (See Comments)   Mouth sores      Medication List        Accurate as of December 02, 2019  4:40 PM. If you have any questions, ask your nurse or doctor.        albuterol 108 (90 Base) MCG/ACT inhaler Commonly known as: VENTOLIN HFA Inhale 2 puffs into the lungs every 6 (six) hours as needed for wheezing or shortness of breath.   aspirin EC 81 MG tablet Take 81 mg by mouth daily.   diclofenac 75 MG EC tablet Commonly known as: VOLTAREN Take 1 tablet (75 mg total) by mouth 2 (two) times daily.   ECHINACEA C COMPLETE PO Take by mouth.   esomeprazole 40 MG capsule Commonly known as: NexIUM Take 1 capsule (40 mg total) by mouth 2 (two) times daily before a meal. Started by: Fransisca Kaufmann Jonell Brumbaugh, MD   famotidine 20 MG tablet Commonly known as: PEPCID Take 40 mg by mouth 2 (two) times daily.   HEALTHY COLON PO Take by mouth.   hydrochlorothiazide 25 MG tablet Commonly known as: HYDRODIURIL TAKE ONE TABLET (25MG  TOTAL) BY MOUTH DAILY   MULTIVITAMIN MEN PO Take by mouth.   omeprazole 20 MG capsule Commonly known as: PRILOSEC Take 20 mg by mouth in the morning and at bedtime.   rOPINIRole 1 MG tablet Commonly known as: Requip Take 1.5 tablets (1.5 mg  total) by mouth at bedtime.   Testosterone Cypionate 100 MG/ML Soln Inject 100 mg as directed every 14 (fourteen) days.   URINOZINC PLUS PO Take by mouth.        Objective:   BP 132/79   Pulse 70   Temp 98.4 F (36.9 C)   Ht 5' 11.5" (1.816 m)   Wt (!) 315 lb (142.9 kg)   SpO2 98%   BMI 43.32 kg/m   Wt Readings from Last 3 Encounters:  12/02/19 (!) 315 lb (142.9 kg)  09/15/19 (!) 326 lb 12.8 oz (148.2 kg)  07/30/19 (!) 315 lb 6.4 oz (143.1 kg)    Physical Exam Vitals and nursing note reviewed.  Constitutional:      General: He is not in acute distress.    Appearance: He is well-developed. He is not diaphoretic.  Eyes:     General: No scleral icterus.    Conjunctiva/sclera: Conjunctivae normal.  Neck:     Thyroid: No thyromegaly.  Cardiovascular:     Rate  and Rhythm: Normal rate and regular rhythm.     Heart sounds: Normal heart sounds. No murmur.  Pulmonary:     Effort: Pulmonary effort is normal. No respiratory distress.     Breath sounds: Normal breath sounds. No wheezing.  Abdominal:     General: Abdomen is flat. Bowel sounds are normal. There is no distension.     Tenderness: There is no abdominal tenderness. There is no right CVA tenderness, left CVA tenderness, guarding or rebound.  Musculoskeletal:        General: Normal range of motion.     Cervical back: Neck supple.  Lymphadenopathy:     Cervical: No cervical adenopathy.  Skin:    General: Skin is warm and dry.     Findings: No rash.  Neurological:     Mental Status: He is alert and oriented to person, place, and time.     Coordination: Coordination normal.  Psychiatric:        Behavior: Behavior normal.     EKG: Normal sinus rhythm  Assessment & Plan:   Problem List Items Addressed This Visit    None    Visit Diagnoses    Chest pain, unspecified type    -  Primary   Relevant Medications   esomeprazole (NEXIUM) 40 MG capsule   Other Relevant Orders   EKG 12-Lead (Completed)   Gastroesophageal reflux disease without esophagitis       Relevant Medications   omeprazole (PRILOSEC) 20 MG capsule   esomeprazole (NEXIUM) 40 MG capsule      Atypical chest pain, based on history sounds more like GERD, will try Nexium which he says he is tried before it has helped, he has gained weight recently and is trying to lose it which should help with the GERD. Follow up plan: Return if symptoms worsen or fail to improve, for Has follow-up with PCP in 2 weeks, keep that.  Counseling provided for all of the vaccine components Orders Placed This Encounter  Procedures  . EKG 12-Lead    Arville Care, MD [facility] 12/02/2019, 4:40 PM

## 2019-12-10 ENCOUNTER — Ambulatory Visit (INDEPENDENT_AMBULATORY_CARE_PROVIDER_SITE_OTHER): Payer: 59

## 2019-12-10 ENCOUNTER — Other Ambulatory Visit: Payer: Self-pay

## 2019-12-10 DIAGNOSIS — R7989 Other specified abnormal findings of blood chemistry: Secondary | ICD-10-CM | POA: Diagnosis not present

## 2019-12-10 NOTE — Progress Notes (Signed)
Patient supplied injection today. 64ml of testosterone given in right upper outer quadrant. Pt tolerated well. Follow appt made to return in 2 weeks.

## 2019-12-14 ENCOUNTER — Other Ambulatory Visit: Payer: Self-pay | Admitting: Family Medicine

## 2019-12-14 DIAGNOSIS — M1990 Unspecified osteoarthritis, unspecified site: Secondary | ICD-10-CM

## 2019-12-16 ENCOUNTER — Ambulatory Visit (INDEPENDENT_AMBULATORY_CARE_PROVIDER_SITE_OTHER): Payer: 59 | Admitting: Family Medicine

## 2019-12-16 ENCOUNTER — Other Ambulatory Visit: Payer: Self-pay

## 2019-12-16 ENCOUNTER — Encounter: Payer: Self-pay | Admitting: Family Medicine

## 2019-12-16 VITALS — BP 123/73 | HR 72 | Temp 97.7°F | Ht 71.5 in | Wt 318.6 lb

## 2019-12-16 DIAGNOSIS — G2581 Restless legs syndrome: Secondary | ICD-10-CM

## 2019-12-16 DIAGNOSIS — G250 Essential tremor: Secondary | ICD-10-CM

## 2019-12-16 DIAGNOSIS — K219 Gastro-esophageal reflux disease without esophagitis: Secondary | ICD-10-CM

## 2019-12-16 DIAGNOSIS — M1990 Unspecified osteoarthritis, unspecified site: Secondary | ICD-10-CM

## 2019-12-16 DIAGNOSIS — N529 Male erectile dysfunction, unspecified: Secondary | ICD-10-CM

## 2019-12-16 DIAGNOSIS — I10 Essential (primary) hypertension: Secondary | ICD-10-CM

## 2019-12-16 MED ORDER — FAMOTIDINE 20 MG PO TABS: 40.0000 mg | ORAL_TABLET | Freq: Two times a day (BID) | ORAL | 2 refills | Status: DC

## 2019-12-16 MED ORDER — DICLOFENAC SODIUM 75 MG PO TBEC: 75.0000 mg | DELAYED_RELEASE_TABLET | Freq: Two times a day (BID) | ORAL | 5 refills | Status: DC

## 2019-12-16 MED ORDER — ROPINIROLE HCL 2 MG PO TABS: 2.0000 mg | ORAL_TABLET | Freq: Every day | ORAL | 2 refills | Status: DC

## 2019-12-16 MED ORDER — PROPRANOLOL HCL 40 MG PO TABS: 40.0000 mg | ORAL_TABLET | Freq: Every day | ORAL | 2 refills | Status: DC

## 2019-12-16 MED ORDER — PANTOPRAZOLE SODIUM 40 MG PO TBEC: 40.0000 mg | DELAYED_RELEASE_TABLET | Freq: Every day | ORAL | 2 refills | Status: DC

## 2019-12-16 MED ORDER — SILDENAFIL CITRATE 20 MG PO TABS: 20.0000 mg | ORAL_TABLET | Freq: Every day | ORAL | 2 refills | Status: DC | PRN

## 2019-12-16 NOTE — Progress Notes (Signed)
Assessment & Plan:  1. Essential hypertension - Well controlled on current regimen.   2. Gastroesophageal reflux disease, unspecified whether esophagitis present - Uncontrolled. Nexium D/C'd. Rx'd Protonix. Encouraged patient to take Pepcid in addition to Protonix if needed.  - pantoprazole (PROTONIX) 40 MG tablet; Take 1 tablet (40 mg total) by mouth daily.  Dispense: 30 tablet; Refill: 2 - famotidine (PEPCID) 20 MG tablet; Take 2 tablets (40 mg total) by mouth 2 (two) times daily.  Dispense: 120 tablet; Refill: 2  3. Restless leg - Well controlled on current regimen.  - rOPINIRole (REQUIP) 2 MG tablet; Take 1 tablet (2 mg total) by mouth at bedtime.  Dispense: 30 tablet; Refill: 2  4. Arthritis - Well controlled on current regimen.  - diclofenac (VOLTAREN) 75 MG EC tablet; Take 1 tablet (75 mg total) by mouth 2 (two) times daily.  Dispense: 60 tablet; Refill: 5  5. Morbid obesity (HCC) - Diet and exercise encouraged. Patient is working on this.   6. Essential tremor - Started propranolol 40 mg once daily since he was previously on 20 mg and it wasn't quite enough.  - propranolol (INDERAL) 40 MG tablet; Take 1 tablet (40 mg total) by mouth daily.  Dispense: 30 tablet; Refill: 2  7. Erectile dysfunction, unspecified erectile dysfunction type - Well controlled on current regimen.  - sildenafil (REVATIO) 20 MG tablet; Take 1-5 tablets (20-100 mg total) by mouth daily as needed.  Dispense: 30 tablet; Refill: 2   Return in about 4 weeks (around 01/13/2020) for follow-up of chronic medication conditions.  Deliah Boston, MSN, APRN, FNP-C [facility]  Subjective:    Patient ID: Jesse Castillo, Jesse Castillo    DOB: 09/10/1965, 54 y.o.   MRN: [id number]  Patient Care Team: Gwenlyn Fudge, FNP as PCP - General (Family Medicine)   Chief Complaint:  Chief Complaint  Patient presents with  . Medical Management of Chronic Issues    check up of chronic medical  conditions  . Heartburn    Patient was seen and put on nexium and patient states it is not helping.    HPI: Jesse Castillo is a 54 y.o. Jesse Castillo presenting presenting on 12/16/2019 for Medical Management of Chronic Issues (check up of chronic medical conditions) and Heartburn (Patient was seen and put on nexium and patient states it is not helping.)  GERD: patient was started on Nexium a couple of weeks ago and reports it has helped some, but not enough. He is no longer taking famotidine. He has previously failed treatment with omeprazole.   RLS: patient has increased Requip to 2 mg at bedtime which has been helpful.   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 He reports he use to take Propranolol 20 mg daily which was somewhat effective. He is hoping to start this back as he has been without it since he was locked up.    Social history:  Relevant past medical, surgical, family and social history reviewed and updated as indicated. Interim medical history since our last visit reviewed.  Allergies and medications reviewed and updated.  DATA REVIEWED: CHART IN EPIC  ROS: Negative unless specifically indicated above in HPI.    Current Outpatient Medications:  .  albuterol (VENTOLIN HFA) 108 (90 Base) MCG/ACT inhaler, Inhale 2 puffs into the lungs every 6 (six) hours as needed for wheezing or shortness of breath., Disp: 18 g, Rfl: 2 .  aspirin EC  81 MG tablet, Take 81 mg by mouth daily., Disp: , Rfl:  .  diclofenac (VOLTAREN) 75 MG EC tablet, TAKE ONE TABLET BY MOUTH TWICE A DAY, Disp: 60 tablet, Rfl: 0 .  esomeprazole (NEXIUM) 40 MG capsule, Take 1 capsule (40 mg total) by mouth 2 (two) times daily before a meal., Disp: 60 capsule, Rfl: 3 .  hydrochlorothiazide (HYDRODIURIL) 25 MG tablet, TAKE ONE TABLET (25MG  TOTAL) BY MOUTH DAILY, Disp: 30 tablet, Rfl: 4 .  Misc Natural Products (URINOZINC PLUS PO), Take by mouth., Disp: , Rfl:  .  Multiple  Vitamins-Minerals (MULTIVITAMIN MEN PO), Take by mouth., Disp: , Rfl:  .  omeprazole (PRILOSEC) 20 MG capsule, Take 20 mg by mouth in the morning and at bedtime., Disp: , Rfl:  .  Probiotic Product (HEALTHY COLON PO), Take by mouth., Disp: , Rfl:  .  rOPINIRole (REQUIP) 1 MG tablet, Take 1.5 tablets (1.5 mg total) by mouth at bedtime., Disp: 45 tablet, Rfl: 1 .  Specialty Vitamins Products (ECHINACEA C COMPLETE PO), Take by mouth., Disp: , Rfl:  .  Testosterone Cypionate 100 MG/ML SOLN, Inject 100 mg as directed every 14 (fourteen) days., Disp: 5 mL, Rfl: 2  Current Facility-Administered Medications:  .  testosterone cypionate (DEPOTESTOTERONE CYPIONATE) injection 100 mg, 100 mg, Intramuscular, Q14 Days, Hendricks Limes F, FNP, 100 mg at 12/10/19 0840   Allergies  Allergen Reactions  . Mobic [Meloxicam] Other (See Comments)    Mouth sores   Past Medical History:  Diagnosis Date  . Enlarged prostate   . Neuropathy    RLE  . Restless leg   . Sleep apnea     Past Surgical History:  Procedure Laterality Date  . HERNIA REPAIR      Social History   Socioeconomic History  . Marital status: Married    Spouse name: Not on file  . Number of children: Not on file  . Years of education: Not on file  . Highest education level: Not on file  Occupational History  . Not on file  Tobacco Use  . Smoking status: Former Smoker    Quit date: 07/03/2007    Years since quitting: 12.4  . Smokeless tobacco: Never Used  Vaping Use  . Vaping Use: Never used  Substance and Sexual Activity  . Alcohol use: Never  . Drug use: Never  . Sexual activity: Not on file  Other Topics Concern  . Not on file  Social History Narrative  . Not on file   Social Determinants of Health   Financial Resource Strain:   . Difficulty of Paying Living Expenses:   Food Insecurity:   . Worried About Charity fundraiser in the Last Year:   . Arboriculturist in the Last Year:   Transportation Needs:   . Lexicographer (Medical):   Marland Kitchen Lack of Transportation (Non-Medical):   Physical Activity:   . Days of Exercise per Week:   . Minutes of Exercise per Session:   Stress:   . Feeling of Stress :   Social Connections:   . Frequency of Communication with Friends and Family:   . Frequency of Social Gatherings with Friends and Family:   . Attends Religious Services:   . Active Member of Clubs or Organizations:   . Attends Archivist Meetings:   Marland Kitchen Marital Status:   Intimate Partner Violence:   . Fear of Current or Ex-Partner:   . Emotionally Abused:   Marland Kitchen Physically  Abused:   . Sexually Abused:         Objective:    BP 123/73   Pulse 72   Temp 97.7 F (36.5 C) (Temporal)   Ht 5' 11.5" (1.816 m)   Wt (!) 318 lb 9.6 oz (144.5 kg)   SpO2 97%   BMI 43.82 kg/m   Wt Readings from Last 3 Encounters:  12/16/19 (!) 318 lb 9.6 oz (144.5 kg)  12/02/19 (!) 315 lb (142.9 kg)  09/15/19 (!) 326 lb 12.8 oz (148.2 kg)    Physical Exam Vitals reviewed.  Constitutional:      General: He is not in acute distress.    Appearance: Normal appearance. He is morbidly obese. He is not ill-appearing, toxic-appearing or diaphoretic.  HENT:     Head: Normocephalic and atraumatic.  Eyes:     General: No scleral icterus.       Right eye: No discharge.        Left eye: No discharge.     Conjunctiva/sclera: Conjunctivae normal.  Cardiovascular:     Rate and Rhythm: Normal rate and regular rhythm.     Heart sounds: Normal heart sounds. No murmur heard.  No friction rub. No gallop.   Pulmonary:     Effort: Pulmonary effort is normal. No respiratory distress.     Breath sounds: Normal breath sounds. No stridor. No wheezing, rhonchi or rales.  Musculoskeletal:        General: Normal range of motion.     Cervical back: Normal range of motion.  Skin:    General: Skin is warm and dry.  Neurological:     Mental Status: He is alert and oriented to person, place, and time. Mental status is at  baseline.  Psychiatric:        Mood and Affect: Mood normal.        Behavior: Behavior normal.        Thought Content: Thought content normal.        Judgment: Judgment normal.     Lab Results  Component Value Date   TSH 3.370 09/16/2019   Lab Results  Component Value Date   WBC 6.3 07/30/2019   HGB 15.4 07/30/2019   HCT 44.0 07/30/2019   MCV 91 07/30/2019   PLT 254 07/30/2019   Lab Results  Component Value Date   NA 140 07/30/2019   K 4.5 07/30/2019   CO2 25 07/30/2019   GLUCOSE 97 07/30/2019   BUN 16 07/30/2019   CREATININE 0.87 07/30/2019   BILITOT 0.6 07/30/2019   ALKPHOS 91 07/30/2019   AST 27 07/30/2019   ALT 20 07/30/2019   PROT 7.2 07/30/2019   ALBUMIN 4.5 07/30/2019   CALCIUM 9.7 07/30/2019   Lab Results  Component Value Date   CHOL 160 07/30/2019   Lab Results  Component Value Date   HDL 49 07/30/2019   Lab Results  Component Value Date   LDLCALC 96 07/30/2019   Lab Results  Component Value Date   TRIG 79 07/30/2019   Lab Results  Component Value Date   CHOLHDL 3.3 07/30/2019   No results found for: HGBA1C

## 2019-12-21 ENCOUNTER — Encounter: Payer: Self-pay | Admitting: Family Medicine

## 2019-12-21 DIAGNOSIS — K219 Gastro-esophageal reflux disease without esophagitis: Secondary | ICD-10-CM | POA: Insufficient documentation

## 2019-12-24 ENCOUNTER — Other Ambulatory Visit: Payer: Self-pay

## 2019-12-24 ENCOUNTER — Ambulatory Visit (INDEPENDENT_AMBULATORY_CARE_PROVIDER_SITE_OTHER): Payer: 59 | Admitting: *Deleted

## 2019-12-24 DIAGNOSIS — R7989 Other specified abnormal findings of blood chemistry: Secondary | ICD-10-CM | POA: Diagnosis not present

## 2020-01-05 ENCOUNTER — Encounter: Payer: Self-pay | Admitting: Family Medicine

## 2020-01-05 DIAGNOSIS — N529 Male erectile dysfunction, unspecified: Secondary | ICD-10-CM

## 2020-01-06 MED ORDER — SILDENAFIL CITRATE 100 MG PO TABS: 50.0000 mg | ORAL_TABLET | Freq: Every day | ORAL | 2 refills | Status: DC | PRN

## 2020-01-07 ENCOUNTER — Ambulatory Visit: Payer: 59

## 2020-01-07 ENCOUNTER — Other Ambulatory Visit: Payer: Self-pay

## 2020-01-15 ENCOUNTER — Other Ambulatory Visit: Payer: Self-pay

## 2020-01-15 ENCOUNTER — Encounter: Payer: Self-pay | Admitting: Family Medicine

## 2020-01-15 ENCOUNTER — Ambulatory Visit (INDEPENDENT_AMBULATORY_CARE_PROVIDER_SITE_OTHER): Payer: 59 | Admitting: Family Medicine

## 2020-01-15 VITALS — BP 120/76 | HR 62 | Temp 97.9°F | Ht 71.5 in | Wt 309.0 lb

## 2020-01-15 DIAGNOSIS — G250 Essential tremor: Secondary | ICD-10-CM

## 2020-01-15 DIAGNOSIS — G4733 Obstructive sleep apnea (adult) (pediatric): Secondary | ICD-10-CM

## 2020-01-15 DIAGNOSIS — N529 Male erectile dysfunction, unspecified: Secondary | ICD-10-CM | POA: Diagnosis not present

## 2020-01-15 DIAGNOSIS — R7989 Other specified abnormal findings of blood chemistry: Secondary | ICD-10-CM

## 2020-01-15 DIAGNOSIS — M1990 Unspecified osteoarthritis, unspecified site: Secondary | ICD-10-CM

## 2020-01-15 DIAGNOSIS — I10 Essential (primary) hypertension: Secondary | ICD-10-CM

## 2020-01-15 DIAGNOSIS — G2581 Restless legs syndrome: Secondary | ICD-10-CM

## 2020-01-15 DIAGNOSIS — K219 Gastro-esophageal reflux disease without esophagitis: Secondary | ICD-10-CM | POA: Diagnosis not present

## 2020-01-15 MED ORDER — PROPRANOLOL HCL 40 MG PO TABS: 40.0000 mg | ORAL_TABLET | Freq: Two times a day (BID) | ORAL | 2 refills | Status: DC

## 2020-01-15 NOTE — Progress Notes (Signed)
Assessment & Plan:  1. Essential tremor - Improving. Inderal increased from 40 mg once daily to twice daily. - propranolol (INDERAL) 40 MG tablet; Take 1 tablet (40 mg total) by mouth 2 (two) times daily.  Dispense: 60 tablet; Refill: 2  2. Gastroesophageal reflux disease, unspecified whether esophagitis present - Well controlled on current regimen.   3. Erectile dysfunction, unspecified erectile dysfunction type - Well controlled on current regimen.   4. Low testosterone in male - Discussed with patient the option of teaching his wife how to give him the injections so that he can do it on his own time and he does not have to pay for visits.  He will discuss this with her and see if she is agreeable.  If so he will schedule a nurse visit for this.  5. Morbid obesity (HCC) - Patient has lost 9 pounds in the past month.  Encourage diet and exercise.  6. Essential hypertension - Well controlled on current regimen.   7. Restless leg - Well controlled on current regimen.   8. Arthritis - Well controlled on current regimen.   9. Obstructive sleep apnea syndrome - Well controlled on current CPAP, which he wears nightly.    Return in about 3 months (around 04/16/2020) for follow-up of chronic medication conditions.  Deliah Boston, MSN, APRN, FNP-C [facility]  Subjective:    Patient ID: Jesse Castillo, male    DOB: [birth year], 54 y.o.   MRN: [id number]  Patient Care Team: Gwenlyn Fudge, FNP as PCP - General (Family Medicine)   Chief Complaint:  Chief Complaint  Patient presents with  . Hypertension    4 week follow up of chronic medical conditions    HPI: Jesse Castillo is a 54 y.o. male presenting on 01/15/2020 for Hypertension (4 week follow up of chronic medical conditions)  Patient reports GERD is well controlled with Protonix 40 mg in the a.m. and famotidine 40 mg in the p.m.  He was started on Inderal for an essential tremor at 40 mg once  daily which he reports has helped some.  He would like to increase to twice daily.  He has transitioned over to the sildenafil 100 mg tablets.  He only takes half tablet at a time which works well for him.  He reports he had to stop the testosterone injections as it was causing him to miss too much time from work to come down here every 2 weeks for an injection.  In addition he has pain for a visit every 2 weeks.  New complaints: None  Social history:  Relevant past medical, surgical, family and social history reviewed and updated as indicated. Interim medical history since our last visit reviewed.  Allergies and medications reviewed and updated.  DATA REVIEWED: CHART IN EPIC  ROS: Negative unless specifically indicated above in HPI.    Current Outpatient Medications:  .  albuterol (VENTOLIN HFA) 108 (90 Base) MCG/ACT inhaler, Inhale 2 puffs into the lungs every 6 (six) hours as needed for wheezing or shortness of breath., Disp: 18 g, Rfl: 2 .  aspirin EC 81 MG tablet, Take 81 mg by mouth daily., Disp: , Rfl:  .  diclofenac (VOLTAREN) 75 MG EC tablet, Take 1 tablet (75 mg total) by mouth 2 (two) times daily., Disp: 60 tablet, Rfl: 5 .  famotidine (PEPCID) 20 MG tablet, Take 2 tablets (40 mg total) by mouth 2 (two) times daily., Disp: 120 tablet, Rfl: 2 .  hydrochlorothiazide (HYDRODIURIL) 25 MG tablet, TAKE ONE TABLET (25MG  TOTAL) BY MOUTH DAILY, Disp: 30 tablet, Rfl: 4 .  Misc Natural Products (URINOZINC PLUS PO), Take by mouth., Disp: , Rfl:  .  Multiple Vitamins-Minerals (MULTIVITAMIN MEN PO), Take by mouth., Disp: , Rfl:  .  pantoprazole (PROTONIX) 40 MG tablet, Take 1 tablet (40 mg total) by mouth daily., Disp: 30 tablet, Rfl: 2 .  Probiotic Product (HEALTHY COLON PO), Take by mouth., Disp: , Rfl:  .  propranolol (INDERAL) 40 MG tablet, Take 1 tablet (40 mg total) by mouth daily., Disp: 30 tablet, Rfl: 2 .  rOPINIRole (REQUIP) 2 MG tablet, Take 1 tablet (2 mg total) by mouth at  bedtime., Disp: 30 tablet, Rfl: 2 .  sildenafil (REVATIO) 20 MG tablet, Take 1-5 tablets (20-100 mg total) by mouth daily as needed., Disp: 30 tablet, Rfl: 2 .  sildenafil (VIAGRA) 100 MG tablet, Take 0.5-1 tablets (50-100 mg total) by mouth daily as needed for erectile dysfunction., Disp: 30 tablet, Rfl: 2 .  Specialty Vitamins Products (ECHINACEA C COMPLETE PO), Take by mouth., Disp: , Rfl:  .  Testosterone Cypionate 100 MG/ML SOLN, Inject 100 mg as directed every 14 (fourteen) days., Disp: 5 mL, Rfl: 2  Current Facility-Administered Medications:  .  testosterone cypionate (DEPOTESTOTERONE CYPIONATE) injection 100 mg, 100 mg, Intramuscular, Q14 Days, F, FNP, 100 mg at 12/24/19 12/26/19   Allergies  Allergen Reactions  . Mobic [Meloxicam] Other (See Comments)    Mouth sores   Past Medical History:  Diagnosis Date  . Enlarged prostate   . Neuropathy    RLE  . Restless leg   . Sleep apnea     Past Surgical History:  Procedure Laterality Date  . HERNIA REPAIR      Social History   Socioeconomic History  . Marital status: Married    Spouse name: Not on file  . Number of children: Not on file  . Years of education: Not on file  . Highest education level: Not on file  Occupational History  . Not on file  Tobacco Use  . Smoking status: Former Smoker    Quit date: 07/03/2007    Years since quitting: 12.5  . Smokeless tobacco: Never Used  Vaping Use  . Vaping Use: Never used  Substance and Sexual Activity  . Alcohol use: Never  . Drug use: Never  . Sexual activity: Not on file  Other Topics Concern  . Not on file  Social History Narrative  . Not on file   Social Determinants of Health   Financial Resource Strain:   . Difficulty of Paying Living Expenses:   Food Insecurity:   . Worried About 08/31/2007 in the Last Year:   . Programme researcher, broadcasting/film/video in the Last Year:   Transportation Needs:   . Barista (Medical):   Freight forwarder Lack of Transportation  (Non-Medical):   Physical Activity:   . Days of Exercise per Week:   . Minutes of Exercise per Session:   Stress:   . Feeling of Stress :   Social Connections:   . Frequency of Communication with Friends and Family:   . Frequency of Social Gatherings with Friends and Family:   . Attends Religious Services:   . Active Member of Clubs or Organizations:   . Attends Marland Kitchen Meetings:   Banker Marital Status:   Intimate Partner Violence:   . Fear of Current or Ex-Partner:   .  Emotionally Abused:   Marland Kitchen Physically Abused:   . Sexually Abused:         Objective:    BP 120/76   Pulse 62   Temp 97.9 F (36.6 C) (Temporal)   Ht 5' 11.5" (1.816 m)   Wt (!) 309 lb (140.2 kg)   SpO2 96%   BMI 42.50 kg/m   Wt Readings from Last 3 Encounters:  01/15/20 (!) 309 lb (140.2 kg)  12/16/19 (!) 318 lb 9.6 oz (144.5 kg)  12/02/19 (!) 315 lb (142.9 kg)    Physical Exam Vitals reviewed.  Constitutional:      General: He is not in acute distress.    Appearance: Normal appearance. He is morbidly obese. He is not ill-appearing, toxic-appearing or diaphoretic.  HENT:     Head: Normocephalic and atraumatic.  Eyes:     General: No scleral icterus.       Right eye: No discharge.        Left eye: No discharge.     Conjunctiva/sclera: Conjunctivae normal.  Cardiovascular:     Rate and Rhythm: Normal rate and regular rhythm.     Heart sounds: Normal heart sounds. No murmur heard.  No friction rub. No gallop.   Pulmonary:     Effort: Pulmonary effort is normal. No respiratory distress.     Breath sounds: Normal breath sounds. No stridor. No wheezing, rhonchi or rales.  Musculoskeletal:        General: Normal range of motion.     Cervical back: Normal range of motion.  Skin:    General: Skin is warm and dry.  Neurological:     Mental Status: He is alert and oriented to person, place, and time. Mental status is at baseline.  Psychiatric:        Mood and Affect: Mood normal.         Behavior: Behavior normal.        Thought Content: Thought content normal.        Judgment: Judgment normal.     Lab Results  Component Value Date   TSH 3.370 09/16/2019   Lab Results  Component Value Date   WBC 6.3 07/30/2019   HGB 15.4 07/30/2019   HCT 44.0 07/30/2019   MCV 91 07/30/2019   PLT 254 07/30/2019   Lab Results  Component Value Date   NA 140 07/30/2019   K 4.5 07/30/2019   CO2 25 07/30/2019   GLUCOSE 97 07/30/2019   BUN 16 07/30/2019   CREATININE 0.87 07/30/2019   BILITOT 0.6 07/30/2019   ALKPHOS 91 07/30/2019   AST 27 07/30/2019   ALT 20 07/30/2019   PROT 7.2 07/30/2019   ALBUMIN 4.5 07/30/2019   CALCIUM 9.7 07/30/2019   Lab Results  Component Value Date   CHOL 160 07/30/2019   Lab Results  Component Value Date   HDL 49 07/30/2019   Lab Results  Component Value Date   LDLCALC 96 07/30/2019   Lab Results  Component Value Date   TRIG 79 07/30/2019   Lab Results  Component Value Date   CHOLHDL 3.3 07/30/2019   No results found for: HGBA1C

## 2020-01-19 ENCOUNTER — Ambulatory Visit (INDEPENDENT_AMBULATORY_CARE_PROVIDER_SITE_OTHER): Payer: 59 | Admitting: *Deleted

## 2020-01-19 ENCOUNTER — Other Ambulatory Visit: Payer: Self-pay

## 2020-01-19 DIAGNOSIS — R7989 Other specified abnormal findings of blood chemistry: Secondary | ICD-10-CM | POA: Diagnosis not present

## 2020-01-19 MED ORDER — "BD DISP NEEDLE 23G X 1"" MISC": 1.0000 | 1 refills | Status: DC

## 2020-01-19 MED ORDER — "SYRINGE/NEEDLE (DISP) 18G X 1"" 3 ML MISC": 1.0000 | 1 refills | Status: DC

## 2020-01-19 NOTE — Addendum Note (Signed)
Addended by: Gwenlyn Fudge on: 01/19/2020 04:41 PM   Modules accepted: Orders

## 2020-01-21 ENCOUNTER — Ambulatory Visit: Payer: 59

## 2020-01-23 ENCOUNTER — Encounter: Payer: Self-pay | Admitting: Family Medicine

## 2020-01-23 DIAGNOSIS — R7989 Other specified abnormal findings of blood chemistry: Secondary | ICD-10-CM

## 2020-01-25 MED ORDER — TESTOSTERONE CYPIONATE 200 MG/ML IM SOLN
100.0000 mg | INTRAMUSCULAR | 2 refills | Status: DC
Start: 2020-01-25 — End: 2020-05-13

## 2020-02-06 ENCOUNTER — Encounter: Payer: Self-pay | Admitting: Family Medicine

## 2020-02-10 ENCOUNTER — Encounter: Payer: Self-pay | Admitting: Family Medicine

## 2020-03-11 ENCOUNTER — Encounter: Payer: Self-pay | Admitting: Family Medicine

## 2020-03-11 DIAGNOSIS — K219 Gastro-esophageal reflux disease without esophagitis: Secondary | ICD-10-CM

## 2020-03-11 MED ORDER — PANTOPRAZOLE SODIUM 40 MG PO TBEC: 40.0000 mg | DELAYED_RELEASE_TABLET | Freq: Every day | ORAL | 2 refills | Status: DC

## 2020-03-21 ENCOUNTER — Other Ambulatory Visit: Payer: Self-pay | Admitting: Family Medicine

## 2020-03-21 DIAGNOSIS — G2581 Restless legs syndrome: Secondary | ICD-10-CM

## 2020-04-14 ENCOUNTER — Encounter: Payer: 59 | Admitting: Family Medicine

## 2020-04-19 ENCOUNTER — Ambulatory Visit: Payer: 59 | Admitting: Family Medicine

## 2020-04-28 ENCOUNTER — Ambulatory Visit: Payer: 59 | Admitting: Family Medicine

## 2020-04-29 ENCOUNTER — Ambulatory Visit (INDEPENDENT_AMBULATORY_CARE_PROVIDER_SITE_OTHER): Payer: 59 | Admitting: Family Medicine

## 2020-04-29 ENCOUNTER — Encounter: Payer: Self-pay | Admitting: Family Medicine

## 2020-04-29 ENCOUNTER — Other Ambulatory Visit: Payer: Self-pay

## 2020-04-29 VITALS — BP 118/78 | HR 64 | Temp 98.6°F | Ht 71.5 in | Wt 310.4 lb

## 2020-04-29 DIAGNOSIS — K219 Gastro-esophageal reflux disease without esophagitis: Secondary | ICD-10-CM | POA: Diagnosis not present

## 2020-04-29 DIAGNOSIS — I10 Essential (primary) hypertension: Secondary | ICD-10-CM | POA: Diagnosis not present

## 2020-04-29 DIAGNOSIS — G2581 Restless legs syndrome: Secondary | ICD-10-CM

## 2020-04-29 DIAGNOSIS — R7989 Other specified abnormal findings of blood chemistry: Secondary | ICD-10-CM

## 2020-04-29 DIAGNOSIS — N529 Male erectile dysfunction, unspecified: Secondary | ICD-10-CM

## 2020-04-29 DIAGNOSIS — B379 Candidiasis, unspecified: Secondary | ICD-10-CM

## 2020-04-29 MED ORDER — CALAZIME SKIN PROTECTANT EX PSTE: 1.0000 "application " | PASTE | CUTANEOUS | 1 refills | Status: DC | PRN

## 2020-04-29 MED ORDER — SILDENAFIL CITRATE 100 MG PO TABS: 50.0000 mg | ORAL_TABLET | Freq: Every day | ORAL | 2 refills | Status: DC | PRN

## 2020-04-29 MED ORDER — NYSTATIN 100000 UNIT/GM EX CREA: 1.0000 "application " | TOPICAL_CREAM | Freq: Two times a day (BID) | CUTANEOUS | 1 refills | Status: DC

## 2020-04-29 MED ORDER — HYDROCHLOROTHIAZIDE 25 MG PO TABS: 25.0000 mg | ORAL_TABLET | Freq: Every day | ORAL | 3 refills | Status: DC

## 2020-04-29 NOTE — Patient Instructions (Signed)
DASH Eating Plan DASH stands for "Dietary Approaches to Stop Hypertension." The DASH eating plan is a healthy eating plan that has been shown to reduce high blood pressure (hypertension). It may also reduce your risk for type 2 diabetes, heart disease, and stroke. The DASH eating plan may also help with weight loss. What are tips for following this plan?  General guidelines  Avoid eating more than 2,300 mg (milligrams) of salt (sodium) a day. If you have hypertension, you may need to reduce your sodium intake to 1,500 mg a day.  Limit alcohol intake to no more than 1 drink a day for nonpregnant women and 2 drinks a day for men. One drink equals 12 oz of beer, 5 oz of wine, or 1 oz of hard liquor.  Work with your health care provider to maintain a healthy body weight or to lose weight. Ask what an ideal weight is for you.  Get at least 30 minutes of exercise that causes your heart to beat faster (aerobic exercise) most days of the week. Activities may include walking, swimming, or biking.  Work with your health care provider or diet and nutrition specialist (dietitian) to adjust your eating plan to your individual calorie needs. Reading food labels   Check food labels for the amount of sodium per serving. Choose foods with less than 5 percent of the Daily Value of sodium. Generally, foods with less than 300 mg of sodium per serving fit into this eating plan.  To find whole grains, look for the word "whole" as the first word in the ingredient list. Shopping  Buy products labeled as "low-sodium" or "no salt added."  Buy fresh foods. Avoid canned foods and premade or frozen meals. Cooking  Avoid adding salt when cooking. Use salt-free seasonings or herbs instead of table salt or sea salt. Check with your health care provider or pharmacist before using salt substitutes.  Do not fry foods. Cook foods using healthy methods such as baking, boiling, grilling, and broiling instead.  Cook with  heart-healthy oils, such as olive, canola, soybean, or sunflower oil. Meal planning  Eat a balanced diet that includes: ? 5 or more servings of fruits and vegetables each day. At each meal, try to fill half of your plate with fruits and vegetables. ? Up to 6-8 servings of whole grains each day. ? Less than 6 oz of lean meat, poultry, or fish each day. A 3-oz serving of meat is about the same size as a deck of cards. One egg equals 1 oz. ? 2 servings of low-fat dairy each day. ? A serving of nuts, seeds, or beans 5 times each week. ? Heart-healthy fats. Healthy fats called Omega-3 fatty acids are found in foods such as flaxseeds and coldwater fish, like sardines, salmon, and mackerel.  Limit how much you eat of the following: ? Canned or prepackaged foods. ? Food that is high in trans fat, such as fried foods. ? Food that is high in saturated fat, such as fatty meat. ? Sweets, desserts, sugary drinks, and other foods with added sugar. ? Full-fat dairy products.  Do not salt foods before eating.  Try to eat at least 2 vegetarian meals each week.  Eat more home-cooked food and less restaurant, buffet, and fast food.  When eating at a restaurant, ask that your food be prepared with less salt or no salt, if possible. What foods are recommended? The items listed may not be a complete list. Talk with your dietitian about   what dietary choices are best for you. Grains Whole-grain or whole-wheat bread. Whole-grain or whole-wheat pasta. Brown rice. Oatmeal. Quinoa. Bulgur. Whole-grain and low-sodium cereals. Pita bread. Low-fat, low-sodium crackers. Whole-wheat flour tortillas. Vegetables Fresh or frozen vegetables (raw, steamed, roasted, or grilled). Low-sodium or reduced-sodium tomato and vegetable juice. Low-sodium or reduced-sodium tomato sauce and tomato paste. Low-sodium or reduced-sodium canned vegetables. Fruits All fresh, dried, or frozen fruit. Canned fruit in natural juice (without  added sugar). Meat and other protein foods Skinless chicken or turkey. Ground chicken or turkey. Pork with fat trimmed off. Fish and seafood. Egg whites. Dried beans, peas, or lentils. Unsalted nuts, nut butters, and seeds. Unsalted canned beans. Lean cuts of beef with fat trimmed off. Low-sodium, lean deli meat. Dairy Low-fat (1%) or fat-free (skim) milk. Fat-free, low-fat, or reduced-fat cheeses. Nonfat, low-sodium ricotta or cottage cheese. Low-fat or nonfat yogurt. Low-fat, low-sodium cheese. Fats and oils Soft margarine without trans fats. Vegetable oil. Low-fat, reduced-fat, or light mayonnaise and salad dressings (reduced-sodium). Canola, safflower, olive, soybean, and sunflower oils. Avocado. Seasoning and other foods Herbs. Spices. Seasoning mixes without salt. Unsalted popcorn and pretzels. Fat-free sweets. What foods are not recommended? The items listed may not be a complete list. Talk with your dietitian about what dietary choices are best for you. Grains Baked goods made with fat, such as croissants, muffins, or some breads. Dry pasta or rice meal packs. Vegetables Creamed or fried vegetables. Vegetables in a cheese sauce. Regular canned vegetables (not low-sodium or reduced-sodium). Regular canned tomato sauce and paste (not low-sodium or reduced-sodium). Regular tomato and vegetable juice (not low-sodium or reduced-sodium). Pickles. Olives. Fruits Canned fruit in a light or heavy syrup. Fried fruit. Fruit in cream or butter sauce. Meat and other protein foods Fatty cuts of meat. Ribs. Fried meat. Bacon. Sausage. Bologna and other processed lunch meats. Salami. Fatback. Hotdogs. Bratwurst. Salted nuts and seeds. Canned beans with added salt. Canned or smoked fish. Whole eggs or egg yolks. Chicken or turkey with skin. Dairy Whole or 2% milk, cream, and half-and-half. Whole or full-fat cream cheese. Whole-fat or sweetened yogurt. Full-fat cheese. Nondairy creamers. Whipped toppings.  Processed cheese and cheese spreads. Fats and oils Butter. Stick margarine. Lard. Shortening. Ghee. Bacon fat. Tropical oils, such as coconut, palm kernel, or palm oil. Seasoning and other foods Salted popcorn and pretzels. Onion salt, garlic salt, seasoned salt, table salt, and sea salt. Worcestershire sauce. Tartar sauce. Barbecue sauce. Teriyaki sauce. Soy sauce, including reduced-sodium. Steak sauce. Canned and packaged gravies. Fish sauce. Oyster sauce. Cocktail sauce. Horseradish that you find on the shelf. Ketchup. Mustard. Meat flavorings and tenderizers. Bouillon cubes. Hot sauce and Tabasco sauce. Premade or packaged marinades. Premade or packaged taco seasonings. Relishes. Regular salad dressings. Where to find more information:  National Heart, Lung, and Blood Institute: www.nhlbi.nih.gov  American Heart Association: www.heart.org Summary  The DASH eating plan is a healthy eating plan that has been shown to reduce high blood pressure (hypertension). It may also reduce your risk for type 2 diabetes, heart disease, and stroke.  With the DASH eating plan, you should limit salt (sodium) intake to 2,300 mg a day. If you have hypertension, you may need to reduce your sodium intake to 1,500 mg a day.  When on the DASH eating plan, aim to eat more fresh fruits and vegetables, whole grains, lean proteins, low-fat dairy, and heart-healthy fats.  Work with your health care provider or diet and nutrition specialist (dietitian) to adjust your eating plan to your   individual calorie needs. This information is not intended to replace advice given to you by your health care provider. Make sure you discuss any questions you have with your health care provider. Document Revised: 05/31/2017 Document Reviewed: 06/11/2016 Elsevier Patient Education  2020 [employer] Managing Your Hypertension Hypertension is commonly called high blood pressure. This is when the force of your blood pressing against  the walls of your arteries is too strong. Arteries are blood vessels that carry blood from your heart throughout your body. Hypertension forces the heart to work harder to pump blood, and may cause the arteries to become narrow or stiff. Having untreated or uncontrolled hypertension can cause heart attack, stroke, kidney disease, and other problems. What are blood pressure readings? A blood pressure reading consists of a higher number over a lower number. Ideally, your blood pressure should be below 120/80. The first ("top") number is called the systolic pressure. It is a measure of the pressure in your arteries as your heart beats. The second ("bottom") number is called the diastolic pressure. It is a measure of the pressure in your arteries as the heart relaxes. What does my blood pressure reading mean? Blood pressure is classified into four stages. Based on your blood pressure reading, your health care provider may use the following stages to determine what type of treatment you need, if any. Systolic pressure and diastolic pressure are measured in a unit called mm Hg. Normal  Systolic pressure: below 120.  Diastolic pressure: below 80. Elevated  Systolic pressure: 120-129.  Diastolic pressure: below 80. Hypertension stage 1  Systolic pressure: 130-139.  Diastolic pressure: 80-89. Hypertension stage 2  Systolic pressure: 140 or above.  Diastolic pressure: 90 or above. What health risks are associated with hypertension? Managing your hypertension is an important responsibility. Uncontrolled hypertension can lead to:  A heart attack.  A stroke.  A weakened blood vessel (aneurysm).  Heart failure.  Kidney damage.  Eye damage.  Metabolic syndrome.  Memory and concentration problems. What changes can I make to manage my hypertension? Hypertension can be managed by making lifestyle changes and possibly by taking medicines. Your health care provider will help you make a plan  to bring your blood pressure within a normal range. Eating and drinking   Eat a diet that is high in fiber and potassium, and low in salt (sodium), added sugar, and fat. An example eating plan is called the DASH (Dietary Approaches to Stop Hypertension) diet. To eat this way: ? Eat plenty of fresh fruits and vegetables. Try to fill half of your plate at each meal with fruits and vegetables. ? Eat whole grains, such as whole wheat pasta, brown rice, or whole grain bread. Fill about one quarter of your plate with whole grains. ? Eat low-fat diary products. ? Avoid fatty cuts of meat, processed or cured meats, and poultry with skin. Fill about one quarter of your plate with lean proteins such as fish, chicken without skin, beans, eggs, and tofu. ? Avoid premade and processed foods. These tend to be higher in sodium, added sugar, and fat.  Reduce your daily sodium intake. Most people with hypertension should eat less than 1,500 mg of sodium a day.  Limit alcohol intake to no more than 1 drink a day for nonpregnant women and 2 drinks a day for men. One drink equals 12 oz of beer, 5 oz of wine, or 1 oz of hard liquor. Lifestyle  Work with your health care provider to maintain a healthy   body weight, or to lose weight. Ask what an ideal weight is for you.  Get at least 30 minutes of exercise that causes your heart to beat faster (aerobic exercise) most days of the week. Activities may include walking, swimming, or biking.  Include exercise to strengthen your muscles (resistance exercise), such as weight lifting, as part of your weekly exercise routine. Try to do these types of exercises for 30 minutes at least 3 days a week.  Do not use any products that contain nicotine or tobacco, such as cigarettes and e-cigarettes. If you need help quitting, ask your health care provider.  Control any long-term (chronic) conditions you have, such as high cholesterol or diabetes. Monitoring  Monitor your blood  pressure at home as told by your health care provider. Your personal target blood pressure may vary depending on your medical conditions, your age, and other factors.  Have your blood pressure checked regularly, as often as told by your health care provider. Working with your health care provider  Review all the medicines you take with your health care provider because there may be side effects or interactions.  Talk with your health care provider about your diet, exercise habits, and other lifestyle factors that may be contributing to hypertension.  Visit your health care provider regularly. Your health care provider can help you create and adjust your plan for managing hypertension. Will I need medicine to control my blood pressure? Your health care provider may prescribe medicine if lifestyle changes are not enough to get your blood pressure under control, and if:  Your systolic blood pressure is 130 or higher.  Your diastolic blood pressure is 80 or higher. Take medicines only as told by your health care provider. Follow the directions carefully. Blood pressure medicines must be taken as prescribed. The medicine does not work as well when you skip doses. Skipping doses also puts you at risk for problems. Contact a health care provider if:  You think you are having a reaction to medicines you have taken.  You have repeated (recurrent) headaches.  You feel dizzy.  You have swelling in your ankles.  You have trouble with your vision. Get help right away if:  You develop a severe headache or confusion.  You have unusual weakness or numbness, or you feel faint.  You have severe pain in your chest or abdomen.  You vomit repeatedly.  You have trouble breathing. Summary  Hypertension is when the force of blood pumping through your arteries is too strong. If this condition is not controlled, it may put you at risk for serious complications.  Your personal target blood pressure  may vary depending on your medical conditions, your age, and other factors. For most people, a normal blood pressure is less than 120/80.  Hypertension is managed by lifestyle changes, medicines, or both. Lifestyle changes include weight loss, eating a healthy, low-sodium diet, exercising more, and limiting alcohol. This information is not intended to replace advice given to you by your health care provider. Make sure you discuss any questions you have with your health care provider. Document Revised: 10/10/2018 Document Reviewed: 05/16/2016 Elsevier Patient Education  2020 Elsevier Inc.  

## 2020-04-29 NOTE — Progress Notes (Signed)
Patient ID: COLT MARTELLE, male    DOB: 10-01-1965, 54 y.o.   MRN: [id number]  Chief Complaint:  Medical Management of Chronic Issues   HPI: WITT PLITT is a 54 y.o. male presenting on 04/29/2020 for Medical Management of Chronic Issues   1. Essential hypertension   2. Restless leg   3. Gastroesophageal reflux disease without esophagitis    1. HTN Complaint with meds - HCTZ 25 mg Exercising Regularly - No Watching Salt intake - Yes Pertinent ROS:  Headache - No Chest pain - No Dyspnea - No Palpitations - No LE edema - sometimes, improves overnight  They report good compliance with medications. No medication side effects  2. Restless leg Taking Requip 69m. This was working initially but not any more. He feels like his whole body is restless at night when he tries to lay down. He would like to increase his medication if possible. If an increase does not work, he would like to try another medication.  3. Yeast infection GJoffreyoften gets a rash in his groin area from sweating. His mother in law is in a nursing home and uses Calazime cream. He has used it and report good relief of symptoms with this. He does not currently have a rash.   4. GERD Compliant with medications - Yes Current medications - pepcid and protonix Adverse side effects - No Cough - occasionally Sore throat - No Voice change - No Hemoptysis - No Dysphagia or dyspepsia - No Water brash - No Red Flags (weight loss, hematochezia, melena, weight loss, early satiety, fevers, odynophagia, or persistent vomiting) - No  5. Low testosterone He has been taking testosterone injection for 6 months now. He has difficulty getting of work in the mornings to have his testosterone checked and would like this done today. His wife has been giving him his injections at home and he reports that this has been going well.   6. ED GKhamneeds a refill on his medication today. He is doing well on Viagra and denies side  effects.   PMH: Smoking status noted  Review of Systems Per HPI.      BP 118/78    Pulse 64    Temp 98.6 F (37 C) (Temporal)    Ht 5' 11.5" (1.816 m)    Wt (!) 310 lb 6 oz (140.8 kg)    BMI 42.69 kg/m   Gen: NAD, alert, cooperative with exam HEENT: NCAT, EOMI, PERRL CV: RRR, good S1/S2, no murmur Resp: CTABL, no wheezes, non-labored Abd: SNTND, BS present, no guarding or organomegaly Ext: No edema, warm Neuro: Alert and oriented, No gross deficits    Assessment and Plan: GJuaquinwas seen today for medical management of chronic issues.  Diagnoses and all orders for this visit:  Essential hypertension Well controlled on current regimen.  -     hydrochlorothiazide (HYDRODIURIL) 25 MG tablet; Take 1 tablet (25 mg total) by mouth daily. -     CMP14+EGFR -     CBC with Differential/Platelet -     Lipid panel  Gastroesophageal reflux disease without esophagitis Well controlled on current regimen.   Restless leg GItalowill double up on his Requip at home for a total of 4 mg. He will let me know if this increase does not help. Discussed trying mirapex if needed.  Low testosterone in male -     Testosterone,Free and Total  Erectile dysfunction, unspecified erectile dysfunction type -  sildenafil (VIAGRA) 100 MG tablet; Take 0.5-1 tablets (50-100 mg total) by mouth daily as needed for erectile dysfunction.  Yeast infection Calazime added as requested. Rx for Nystatin to cover for fungal. No rash today, but description is consistent with fungal.  -     nystatin cream (MYCOSTATIN); Apply 1 application topically 2 (two) times daily. -     Skin Protectants, Misc. (CALAZIME SKIN PROTECTANT) PSTE; Apply 1 application topically as needed.  Educational handout given for Hypertension, DASH diet.  Follow up in 3 months for chronic illnesses.   The above assessment and management plan was discussed with the patient. The patient verbalized understanding of and has agreed to the  management plan. Patient is aware to call the clinic if symptoms persist or worsen. Patient is aware when to return to the clinic for a follow-up visit. Patient educated on when it is appropriate to go to the emergency department.   Marjorie Smolder, FNP-C [facility] [phone]

## 2020-05-02 ENCOUNTER — Encounter: Payer: Self-pay | Admitting: Family Medicine

## 2020-05-10 ENCOUNTER — Encounter: Payer: Self-pay | Admitting: Family Medicine

## 2020-05-11 MED ORDER — ROPINIROLE HCL 4 MG PO TABS: 4.0000 mg | ORAL_TABLET | Freq: Every day | ORAL | 1 refills | Status: DC

## 2020-05-12 ENCOUNTER — Other Ambulatory Visit: Payer: Self-pay

## 2020-05-12 ENCOUNTER — Encounter: Payer: Self-pay | Admitting: Family Medicine

## 2020-05-12 ENCOUNTER — Other Ambulatory Visit: Payer: 59

## 2020-05-12 ENCOUNTER — Telehealth: Payer: Self-pay

## 2020-05-13 ENCOUNTER — Other Ambulatory Visit: Payer: Self-pay | Admitting: Family Medicine

## 2020-05-13 ENCOUNTER — Encounter: Payer: Self-pay | Admitting: Family Medicine

## 2020-05-13 DIAGNOSIS — R7989 Other specified abnormal findings of blood chemistry: Secondary | ICD-10-CM

## 2020-05-13 LAB — CBC WITH DIFFERENTIAL/PLATELET
Basophils Absolute: 0 10*3/uL (ref 0.0–0.2)
Basos: 1 %
EOS (ABSOLUTE): 0.1 10*3/uL (ref 0.0–0.4)
Eos: 2 %
Hematocrit: 44.5 % (ref 37.5–51.0)
Hemoglobin: 15.4 g/dL (ref 13.0–17.7)
Immature Grans (Abs): 0 10*3/uL (ref 0.0–0.1)
Immature Granulocytes: 0 %
Lymphocytes Absolute: 1.8 10*3/uL (ref 0.7–3.1)
Lymphs: 27 %
MCH: 32 pg (ref 26.6–33.0)
MCHC: 34.6 g/dL (ref 31.5–35.7)
MCV: 93 fL (ref 79–97)
Monocytes Absolute: 1.1 10*3/uL — ABNORMAL HIGH (ref 0.1–0.9)
Monocytes: 17 %
Neutrophils Absolute: 3.6 10*3/uL (ref 1.4–7.0)
Neutrophils: 53 %
Platelets: 222 10*3/uL (ref 150–450)
RBC: 4.81 x10E6/uL (ref 4.14–5.80)
RDW: 12.2 % (ref 11.6–15.4)
WBC: 6.6 10*3/uL (ref 3.4–10.8)

## 2020-05-13 LAB — LIPID PANEL
Chol/HDL Ratio: 4.4 ratio (ref 0.0–5.0)
Cholesterol, Total: 164 mg/dL (ref 100–199)
HDL: 37 mg/dL — ABNORMAL LOW (ref >39)
LDL Chol Calc (NIH): 107 mg/dL — ABNORMAL HIGH (ref 0–99)
Triglycerides: 108 mg/dL (ref 0–149)
VLDL Cholesterol Cal: 20 mg/dL (ref 5–40)

## 2020-05-13 LAB — CMP14+EGFR
ALT: 25 IU/L (ref 0–44)
AST: 24 IU/L (ref 0–40)
Albumin/Globulin Ratio: 1.7 (ref 1.2–2.2)
Albumin: 4.3 g/dL (ref 3.8–4.9)
Alkaline Phosphatase: 86 IU/L (ref 44–121)
BUN/Creatinine Ratio: 22 — ABNORMAL HIGH (ref 9–20)
BUN: 22 mg/dL (ref 6–24)
Bilirubin Total: 0.5 mg/dL (ref 0.0–1.2)
CO2: 25 mmol/L (ref 20–29)
Calcium: 9.6 mg/dL (ref 8.7–10.2)
Chloride: 107 mmol/L — ABNORMAL HIGH (ref 96–106)
Creatinine, Ser: 1.01 mg/dL (ref 0.76–1.27)
GFR calc Af Amer: 99 mL/min/{1.73_m2} (ref >59)
GFR calc non Af Amer: 86 mL/min/{1.73_m2} (ref >59)
Globulin, Total: 2.6 g/dL (ref 1.5–4.5)
Glucose: 81 mg/dL (ref 65–99)
Potassium: 4.6 mmol/L (ref 3.5–5.2)
Sodium: 145 mmol/L — ABNORMAL HIGH (ref 134–144)
Total Protein: 6.9 g/dL (ref 6.0–8.5)

## 2020-05-13 LAB — TESTOSTERONE,FREE AND TOTAL
Testosterone, Free: 7.9 pg/mL (ref 7.2–24.0)
Testosterone: 263 ng/dL — ABNORMAL LOW (ref 264–916)

## 2020-05-13 MED ORDER — TESTOSTERONE CYPIONATE 200 MG/ML IM SOLN: 200.0000 mg | INTRAMUSCULAR | 2 refills | Status: DC

## 2020-05-13 NOTE — Telephone Encounter (Signed)
Patient states he no longer needs [place].

## 2020-06-06 ENCOUNTER — Other Ambulatory Visit: Payer: Self-pay | Admitting: Family Medicine

## 2020-06-06 DIAGNOSIS — K219 Gastro-esophageal reflux disease without esophagitis: Secondary | ICD-10-CM

## 2020-06-16 ENCOUNTER — Other Ambulatory Visit: Payer: Self-pay | Admitting: Family Medicine

## 2020-06-16 DIAGNOSIS — K219 Gastro-esophageal reflux disease without esophagitis: Secondary | ICD-10-CM

## 2020-07-02 DIAGNOSIS — J1282 Pneumonia due to coronavirus disease 2019: Secondary | ICD-10-CM

## 2020-07-02 DIAGNOSIS — U071 COVID-19: Secondary | ICD-10-CM

## 2020-07-02 HISTORY — DX: Pneumonia due to coronavirus disease 2019: J12.82

## 2020-07-02 HISTORY — DX: COVID-19: U07.1

## 2020-07-07 ENCOUNTER — Ambulatory Visit (INDEPENDENT_AMBULATORY_CARE_PROVIDER_SITE_OTHER): Payer: 59 | Admitting: Pulmonary Disease

## 2020-07-07 ENCOUNTER — Other Ambulatory Visit: Payer: Self-pay

## 2020-07-07 ENCOUNTER — Encounter: Payer: Self-pay | Admitting: Pulmonary Disease

## 2020-07-07 VITALS — BP 152/98 | HR 71 | Temp 97.1°F | Ht 71.0 in | Wt 315.0 lb

## 2020-07-07 DIAGNOSIS — E669 Obesity, unspecified: Secondary | ICD-10-CM

## 2020-07-07 DIAGNOSIS — G4730 Sleep apnea, unspecified: Secondary | ICD-10-CM

## 2020-07-07 DIAGNOSIS — G4733 Obstructive sleep apnea (adult) (pediatric): Secondary | ICD-10-CM

## 2020-07-07 DIAGNOSIS — G2581 Restless legs syndrome: Secondary | ICD-10-CM | POA: Diagnosis not present

## 2020-07-07 NOTE — Patient Instructions (Signed)
Will arrange for home sleep study and call with results  Follow up in 4 months 

## 2020-07-07 NOTE — Progress Notes (Signed)
[facility]  Chief Complaint  Patient presents with  . Consult    Sleep Consult- Has CPAP machine (55 yrs old), still uses    Constitutional:  BP (!) 152/98 (BP Location: Left Arm, Cuff Size: Normal)   Pulse 71   Temp (!) 97.1 F (36.2 C) (Other (Comment)) Comment (Src): wrist  Ht 5\' 11"  (1.803 m)   Wt (!) 315 lb (142.9 kg)   SpO2 97% Comment: Room air  BMI 43.93 kg/m   Past Medical History:  BPH, GERD, HTN, ED, Low T  Past Surgical History:  He  has a past surgical history that includes Hernia repair.  Brief Summary:  Jesse Castillo is a 55 y.o. male former smoker with obstructive sleep apnea and restless leg syndrome.  He has DOT license.      Subjective:   He was diagnosed with sleep apnea about 15 years ago.  He has an 46.  He reports having a home sleep study again a few years ago while in prison, but results aren't available.  He has restless sleep and can't sleep at all w/o CPAP.  He goes to sleep between 7 and 8 pm.  He falls asleep in an hour.  He wakes up several times times but not sure what wakes him up.  He gets out of bed at 4 am.  He feels tired in the morning.  He denies morning headache.  He does not use anything to help him stay awake.  He takes requip around 5 pm.  He can fall asleep easily if he is sitting quiet.  He denies sleep walking, sleep talking, bruxism, or nightmares.  He denies sleep hallucinations, sleep paralysis, or cataplexy.  The Epworth score is 16 out of 24.    Physical Exam:   Appearance - well kempt   ENMT - no sinus tenderness, no oral exudate, no LAN, Mallampati 3 airway, no stridor  Respiratory - equal breath sounds bilaterally, no wheezing or rales  CV - s1s2 regular rate and rhythm, no murmurs  Ext - no clubbing, no edema  Skin - no rashes  Psych - normal mood and affect   Sleep Tests:    Social History:  He  reports that he quit smoking about 13 years  ago. His smoking use included cigarettes. He has a 54.00 pack-year smoking history. He has never used smokeless tobacco. He reports that he does not drink alcohol and does not use drugs.  Family History:  His family history includes Heart disease in his maternal grandmother and mother; Hypertension in his daughter; Parkinson's disease in his paternal grandmother; Tremor in his father.    Discussion:  He has history of obstructive sleep apnea and hypertension.  His BMI is > 35.  He reports snoring, sleep disruption, apnea and daytime sleepiness w/o use of CPAP.  His current device is ancient.  He also has restless leg syndrome, but some of this could be related to sleep disruption from under-treated sleep apnea.  Assessment/Plan:   Snoring with excessive daytime sleepiness from obstructive sleep apnea. - will arrange for home sleep study pending insurance approval - assuming this shows he still has sleep apnea will then arrange for auto CPAP set up and then follow up several months after he gets new machine - his old device is a VPAP, but uncertain whether he will eventually need a Bipap device - advised that he could look online to purchase a CPAP  machine on his own since his out of pocket expense is quite high with his insurance plan; could still set up supplies through a DME  Restless leg syndrome. - continue requip for now - reassess after he is established on therapy for sleep apnea - if his leg symptoms persist, could then check ferritin level; if ferritin less than 50, then he might benefit from trial of iron supplementation with vitamin C to get ferritin level above 100 - if he needs additional medication for RLS, then gabapentin would be reasonable option to try next  Obesity. - discussed how weight can impact sleep and risk for sleep disordered breathing - discussed options to assist with weight loss: combination of diet modification, cardiovascular and strength training  exercises  Cardiovascular risk. - had an extensive discussion regarding the adverse health consequences related to untreated sleep disordered breathing - specifically discussed the risks for hypertension, coronary artery disease, cardiac dysrhythmias, cerebrovascular disease, and diabetes - lifestyle modification discussed  Safe driving practices. - discussed how sleep disruption can increase risk of accidents, particularly when driving - safe driving practices were discussed  Therapies for obstructive sleep apnea. - if the sleep study shows significant sleep apnea, then various therapies for treatment were reviewed: CPAP, oral appliance, and surgical interventions  Time Spent Involved in Patient Care on Day of Examination:  51 minutes  Follow up:  Patient Instructions  Will arrange for home sleep study and call with results  Follow up in 4 months    Medication List:   Allergies as of 07/07/2020      Reactions   Mobic [meloxicam] Other (See Comments)   Mouth sores      Medication List       Accurate as of July 07, 2020  9:42 AM. If you have any questions, ask your nurse or doctor.        albuterol 108 (90 Base) MCG/ACT inhaler Commonly known as: VENTOLIN HFA Inhale 2 puffs into the lungs every 6 (six) hours as needed for wheezing or shortness of breath.   aspirin EC 81 MG tablet Take 81 mg by mouth daily.   BD Disp Needle 23G X 1" Misc Generic drug: NEEDLE (DISP) 23 G 1 Device by Does not apply route every 14 (fourteen) days.   Calazime Skin Protectant Pste Apply 1 application topically as needed.   diclofenac 75 MG EC tablet Commonly known as: VOLTAREN Take 1 tablet (75 mg total) by mouth 2 (two) times daily.   ECHINACEA C COMPLETE PO Take by mouth.   famotidine 20 MG tablet Commonly known as: PEPCID TAKE 2 TABLETS (40 MG TOTAL) BY MOUTH TWO TIMES DAILY.   fluticasone 50 MCG/ACT nasal spray Commonly known as: FLONASE Place into both nostrils in the  morning and at bedtime.   HEALTHY COLON PO Take by mouth.   hydrochlorothiazide 25 MG tablet Commonly known as: HYDRODIURIL Take 1 tablet (25 mg total) by mouth daily.   loratadine 10 MG tablet Commonly known as: CLARITIN Take 10 mg by mouth in the morning and at bedtime.   MULTIVITAMIN MEN PO Take by mouth.   nystatin cream Commonly known as: MYCOSTATIN Apply 1 application topically 2 (two) times daily. What changed:   when to take this  reasons to take this   pantoprazole 40 MG tablet Commonly known as: PROTONIX TAKE ONE TABLET (40MG  TOTAL) BY MOUTH DAILY   rOPINIRole 4 MG tablet Commonly known as: REQUIP Take 1 tablet (4 mg total) by mouth at  bedtime.   sildenafil 100 MG tablet Commonly known as: VIAGRA Take 0.5-1 tablets (50-100 mg total) by mouth daily as needed for erectile dysfunction.   Syringe/Needle (Disp) 18G X 1" 3 ML Misc 1 Device by Does not apply route every 14 (fourteen) days.   testosterone cypionate 200 MG/ML injection Commonly known as: DEPOTESTOSTERONE CYPIONATE Inject 1 mL (200 mg total) into the muscle every 14 (fourteen) days.   URINOZINC PLUS PO Take by mouth.       Signature:  Coralyn Helling, MD [facility] Pager - [phone] 07/07/2020, 9:42 AM

## 2020-07-11 ENCOUNTER — Other Ambulatory Visit: Payer: Self-pay | Admitting: Family Medicine

## 2020-07-11 DIAGNOSIS — M1990 Unspecified osteoarthritis, unspecified site: Secondary | ICD-10-CM

## 2020-07-14 ENCOUNTER — Encounter: Payer: Self-pay | Admitting: Family Medicine

## 2020-07-21 ENCOUNTER — Other Ambulatory Visit: Payer: Self-pay

## 2020-07-21 ENCOUNTER — Emergency Department (HOSPITAL_COMMUNITY)
Admission: EM | Admit: 2020-07-21 | Discharge: 2020-07-21 | Disposition: A | Discharge status: Home / Self Care | Payer: 59 | Attending: Emergency Medicine | Admitting: Emergency Medicine

## 2020-07-21 ENCOUNTER — Emergency Department (HOSPITAL_COMMUNITY): Payer: 59

## 2020-07-21 DIAGNOSIS — Z79899 Other long term (current) drug therapy: Secondary | ICD-10-CM | POA: Insufficient documentation

## 2020-07-21 DIAGNOSIS — J1282 Pneumonia due to coronavirus disease 2019: Secondary | ICD-10-CM | POA: Diagnosis not present

## 2020-07-21 DIAGNOSIS — Z87891 Personal history of nicotine dependence: Secondary | ICD-10-CM | POA: Insufficient documentation

## 2020-07-21 DIAGNOSIS — Z7982 Long term (current) use of aspirin: Secondary | ICD-10-CM | POA: Diagnosis not present

## 2020-07-21 DIAGNOSIS — I10 Essential (primary) hypertension: Secondary | ICD-10-CM | POA: Insufficient documentation

## 2020-07-21 DIAGNOSIS — R059 Cough, unspecified: Secondary | ICD-10-CM | POA: Diagnosis present

## 2020-07-21 DIAGNOSIS — U071 COVID-19: Secondary | ICD-10-CM | POA: Insufficient documentation

## 2020-07-21 MED ORDER — ALBUTEROL SULFATE HFA 108 (90 BASE) MCG/ACT IN AERS: 2.0000 | INHALATION_SPRAY | RESPIRATORY_TRACT | Status: DC | PRN

## 2020-07-21 NOTE — Discharge Instructions (Signed)
Tylenol every 4 hours.  Use albuterol 2 puffs every 4 hours

## 2020-07-21 NOTE — ED Triage Notes (Signed)
States he did a home covid test and was positive, c/o shortness of breath and body aches

## 2020-07-21 NOTE — ED Provider Notes (Signed)
[facility] EMERGENCY DEPARTMENT Provider Note   CSN: [id number] Arrival date & time: 07/21/20  1133     History Chief Complaint  Patient presents with  . Covid Positive    Jesse Castillo is a 55 y.o. male.  The history is provided by the patient. No language interpreter was used.  Cough Cough characteristics:  Non-productive Sputum characteristics:  Nondescript Severity:  Moderate Onset quality:  Gradual Progression:  Worsening Chronicity:  New Relieved by:  Nothing Worsened by:  Nothing Ineffective treatments:  None tried Associated symptoms: chest pain and headaches    Pt reports he had a positive home covid test. Pt reports he has pain in his chest with cough     Past Medical History:  Diagnosis Date  . Enlarged prostate   . Neuropathy    RLE  . Restless leg   . Sleep apnea     Patient Active Problem List   Diagnosis Date Noted  . Gastroesophageal reflux disease 12/21/2019  . Essential hypertension 09/20/2019  . Arthritis 09/20/2019  . Morbid obesity (HCC) 09/20/2019  . Low testosterone in male 09/18/2019  . Enlarged prostate   . Neuropathy   . Restless leg   . Sleep apnea     Past Surgical History:  Procedure Laterality Date  . HERNIA REPAIR         Family History  Problem Relation Age of Onset  . Heart disease Mother   . Tremor Father   . Hypertension Daughter   . Heart disease Maternal Grandmother   . Parkinson's disease Paternal Grandmother     Social History   Tobacco Use  . Smoking status: Former Smoker    Packs/day: 3.00    Years: 18.00    Pack years: 54.00    Types: Cigarettes    Quit date: 07/03/2007    Years since quitting: 13.0  . Smokeless tobacco: Never Used  Vaping Use  . Vaping Use: Never used  Substance Use Topics  . Alcohol use: Never  . Drug use: Never    Home Medications Prior to Admission medications   Medication Sig Start Date End Date Taking? Authorizing Provider  albuterol (VENTOLIN HFA) 108 (90 Base)  MCG/ACT inhaler Inhale 2 puffs into the lungs every 6 (six) hours as needed for wheezing or shortness of breath. 09/15/19   Gwenlyn Fudge, FNP  aspirin EC 81 MG tablet Take 81 mg by mouth daily.    [provider]  diclofenac (VOLTAREN) 75 MG EC tablet TAKE ONE TABLET (75 MG TOTAL) BY MOUTH TWO TIMES DAILY. 07/11/20   Raliegh Ip, DO  famotidine (PEPCID) 20 MG tablet TAKE 2 TABLETS (40 MG TOTAL) BY MOUTH TWO TIMES DAILY. 06/17/20   Gabriel Earing, FNP  fluticasone (FLONASE) 50 MCG/ACT nasal spray Place into both nostrils in the morning and at bedtime.    [provider]  hydrochlorothiazide (HYDRODIURIL) 25 MG tablet Take 1 tablet (25 mg total) by mouth daily. 04/29/20   Gabriel Earing, FNP  loratadine (CLARITIN) 10 MG tablet Take 10 mg by mouth in the morning and at bedtime.    [provider]  Misc Natural Products (URINOZINC PLUS PO) Take by mouth.    [provider]  Multiple Vitamins-Minerals (MULTIVITAMIN MEN PO) Take by mouth.    [provider]  NEEDLE, DISP, 23 G (BD DISP NEEDLE) 23G X 1" MISC 1 Device by Does not apply route every 14 (fourteen) days. 01/19/20   Gwenlyn Fudge, FNP  nystatin cream (MYCOSTATIN) Apply 1 application topically 2 (two) times daily. Patient taking differently: Apply 1 application topically 2 (two) times daily as needed. 04/29/20   Gabriel Earing, FNP  pantoprazole (PROTONIX) 40 MG tablet TAKE ONE TABLET (40MG  TOTAL) BY MOUTH DAILY 06/06/20   14/6/21 M, DO  Probiotic Product (HEALTHY COLON PO) Take by mouth.    [provider]  rOPINIRole (REQUIP) 4 MG tablet Take 1 tablet (4 mg total) by mouth at bedtime. 05/11/20   13/10/21, FNP  sildenafil (VIAGRA) 100 MG tablet Take 0.5-1 tablets (50-100 mg total) by mouth daily as needed for erectile dysfunction. 04/29/20   05/01/20, FNP  Skin Protectants, Misc. (CALAZIME SKIN PROTECTANT) PSTE Apply 1 application topically as  needed. 04/29/20   05/01/20, FNP  Specialty Vitamins Products (ECHINACEA C COMPLETE PO) Take by mouth.    [provider]  Syringe/Needle, Disp, 18G X 1" 3 ML MISC 1 Device by Does not apply route every 14 (fourteen) days. 01/19/20   01/21/20, FNP  testosterone cypionate (DEPOTESTOSTERONE CYPIONATE) 200 MG/ML injection Inject 1 mL (200 mg total) into the muscle every 14 (fourteen) days. 05/13/20   13/12/21, FNP    Allergies    Mobic [meloxicam]  Review of Systems   Review of Systems  Respiratory: Positive for cough.   Cardiovascular: Positive for chest pain.  Neurological: Positive for headaches.  All other systems reviewed and are negative.   Physical Exam Updated Vital Signs BP (!) 137/94 (BP Location: Left Arm)   Pulse 71   Temp 97.7 F (36.5 C) (Oral)   Resp 16   Ht 5\' 11"  (1.803 m)   Wt (!) 142.9 kg   SpO2 99%   BMI 43.93 kg/m   Physical Exam Vitals and nursing note reviewed.  Constitutional:      Appearance: He is well-developed and well-nourished.  HENT:     Head: Normocephalic and atraumatic.  Eyes:     Conjunctiva/sclera: Conjunctivae normal.  Cardiovascular:     Rate and Rhythm: Normal rate and regular rhythm.     Heart sounds: No murmur heard.   Pulmonary:     Effort: Pulmonary effort is normal. No respiratory distress.     Breath sounds: Normal breath sounds.  Abdominal:     Palpations: Abdomen is soft.     Tenderness: There is no abdominal tenderness.  Musculoskeletal:        General: No edema.     Cervical back: Neck supple.  Skin:    General: Skin is warm and dry.  Neurological:     General: No focal deficit present.     Mental Status: He is alert.  Psychiatric:        Mood and Affect: Mood and affect and mood normal.     ED Results / Procedures / Treatments   Labs (all labs ordered are listed, but only abnormal results are displayed) Labs Reviewed  SARS CORONAVIRUS 2 (TAT 6-24 HRS)     EKG None  Radiology DG Chest 2 View  Result Date: 07/21/2020 CLINICAL DATA:  Shortness of breath.  Positive home COVID test. EXAM: CHEST - 2 VIEW COMPARISON:  None. FINDINGS: Patchy ground-glass opacities are seen in both lower lungs. The cardiopericardial silhouette is within normal limits for size. No pleural effusion. The visualized bony structures of the thorax show no acute abnormality. IMPRESSION: Patchy bibasilar ground-glass opacity. Imaging features compatible with multifocal pneumonia. Electronically Signed   By:  Kennith Center M.D.   On: 07/21/2020 13:02    Procedures Procedures (including critical care time)  Medications Ordered in ED Medications - No data to display  ED Course  I have reviewed the triage vital signs and the nursing notes.  Pertinent labs & imaging results that were available during my care of the patient were reviewed by me and considered in my medical decision making (see chart for details).    MDM Rules/Calculators/A&P                          MDM:  Covid PCr for conformation.  Pt referred to covid clinic  Pt advised tylenol for fever, use albuterol every 4 hours.   Final Clinical Impression(s) / ED Diagnoses Final diagnoses:  Pneumonia due to COVID-19 virus    Rx / DC Orders ED Discharge Orders    None    An After Visit Summary was printed and given to the patient.    Elson Areas, [place] 07/21/20 1737    Vanetta Mulders, MD 07/25/20 [phone]

## 2020-07-22 LAB — SARS CORONAVIRUS 2 (TAT 6-24 HRS): SARS Coronavirus 2: POSITIVE — AB

## 2020-07-26 DIAGNOSIS — G4733 Obstructive sleep apnea (adult) (pediatric): Secondary | ICD-10-CM

## 2020-07-27 ENCOUNTER — Other Ambulatory Visit: Payer: Self-pay

## 2020-07-27 ENCOUNTER — Ambulatory Visit: Payer: 59

## 2020-07-27 DIAGNOSIS — G4733 Obstructive sleep apnea (adult) (pediatric): Secondary | ICD-10-CM

## 2020-07-28 ENCOUNTER — Telehealth: Payer: Self-pay | Admitting: Pulmonary Disease

## 2020-07-28 DIAGNOSIS — G4733 Obstructive sleep apnea (adult) (pediatric): Secondary | ICD-10-CM

## 2020-07-28 NOTE — Telephone Encounter (Signed)
HST 07/27/20 >> AHI AHI 20, SpO2 low 86%.   Please inform him that his sleep study shows moderate obstructive sleep apnea.  Please arrange for auto CPAP with pressure range 5 to 20 cm H2O with heated humidity and mask of choice.  Please schedule ROV in 4 to 6 months.

## 2020-07-29 NOTE — Telephone Encounter (Signed)
Called and went over HST result per Dr Craige Cotta with patient. All questions answered and patient expressed full understanding and ok with CPAP order. Patient aware to make sure to schedule office visit sometime in 4 to 6 months per Dr Craige Cotta. Nothing further needed at this time.

## 2020-07-29 NOTE — Telephone Encounter (Signed)
Called and spoke with patient's wife, Alvis Lemmings (Per [place]) and went over HST results per Dr Craige Cotta with Alvis Lemmings. All questions answered and wife agreeable to Dr Evlyn Courier recommendation for order to be placed for auto CPAP. Order placed per Dr Craige Cotta.  Will call again to speak with patient today at 4:30pm per wife request to go over HST. Recall placed for reminder to schedule ROV with Dr Craige Cotta.

## 2020-08-01 ENCOUNTER — Other Ambulatory Visit: Payer: Self-pay | Admitting: Family Medicine

## 2020-08-01 DIAGNOSIS — K219 Gastro-esophageal reflux disease without esophagitis: Secondary | ICD-10-CM

## 2020-08-04 ENCOUNTER — Ambulatory Visit (INDEPENDENT_AMBULATORY_CARE_PROVIDER_SITE_OTHER): Payer: 59 | Admitting: Family Medicine

## 2020-08-04 ENCOUNTER — Encounter: Payer: Self-pay | Admitting: Family Medicine

## 2020-08-04 ENCOUNTER — Other Ambulatory Visit: Payer: Self-pay

## 2020-08-04 VITALS — BP 137/88 | HR 80 | Temp 98.7°F | Ht 71.0 in | Wt 322.6 lb

## 2020-08-04 DIAGNOSIS — R0789 Other chest pain: Secondary | ICD-10-CM

## 2020-08-04 DIAGNOSIS — N4889 Other specified disorders of penis: Secondary | ICD-10-CM

## 2020-08-04 DIAGNOSIS — K219 Gastro-esophageal reflux disease without esophagitis: Secondary | ICD-10-CM

## 2020-08-04 DIAGNOSIS — G4733 Obstructive sleep apnea (adult) (pediatric): Secondary | ICD-10-CM | POA: Diagnosis not present

## 2020-08-04 DIAGNOSIS — R7989 Other specified abnormal findings of blood chemistry: Secondary | ICD-10-CM

## 2020-08-04 DIAGNOSIS — I10 Essential (primary) hypertension: Secondary | ICD-10-CM

## 2020-08-04 DIAGNOSIS — K429 Umbilical hernia without obstruction or gangrene: Secondary | ICD-10-CM

## 2020-08-04 DIAGNOSIS — N529 Male erectile dysfunction, unspecified: Secondary | ICD-10-CM | POA: Insufficient documentation

## 2020-08-04 MED ORDER — PREDNISONE 10 MG (21) PO TBPK: ORAL_TABLET | ORAL | 0 refills | Status: DC

## 2020-08-04 MED ORDER — TADALAFIL 10 MG PO TABS: 10.0000 mg | ORAL_TABLET | ORAL | 2 refills | Status: DC | PRN

## 2020-08-04 NOTE — Progress Notes (Signed)
Assessment & Plan:  1. Essential hypertension - Well controlled on current regimen.   2. Gastroesophageal reflux disease, unspecified whether esophagitis present - Well controlled on current regimen.   3. Obstructive sleep apnea syndrome - Managed by pulmonology.  4. Low testosterone in male - Continue injections, levels are improving.  5. Musculoskeletal chest pain - Discussed pain and reassured this is not his heart.  - predniSONE (STERAPRED UNI-PAK 21 TAB) 10 MG (21) TBPK tablet; As directed x 6 days  Dispense: 21 tablet; Refill: 0  6. Erectile dysfunction, unspecified erectile dysfunction type - Changed Viagra to Cialis due to headaches with Viagra.  - tadalafil (CIALIS) 10 MG tablet; Take 1 tablet (10 mg total) by mouth every other day as needed for erectile dysfunction.  Dispense: 30 tablet; Refill: 2  7. Pain in penis - Discussed possibility of Peyronie's disease and that if patient would like treatment he needs a referral to urology.  8. Umbilical hernia without obstruction and without gangrene - Reassurance provided. Discussed when to seek care if needed.    Return in about 4 months (around 12/02/2020) for annual physical.  Deliah Boston, MSN, APRN, FNP-C [facility]  Subjective:    Patient ID: Jesse Castillo, male    DOB: 06-Jun-1966, 55 y.o.   MRN: [id number]  Patient Care Team: Gwenlyn Fudge, FNP as PCP - General (Family Medicine)   Chief Complaint:  Chief Complaint  Patient presents with  . Hypertension    Check up of chronic medical conditions   . Er follow up    Patient went to to [facility] ER on 1/20 for chest pain & sob x 4 weeks. Patient had COVID x 3 weeks ago.    HPI: Jesse Castillo is a 55 y.o. male presenting on 08/04/2020 for Hypertension (Check up of chronic medical conditions/) and Er follow up (Patient went to to [facility] ER on 1/20 for chest pain & sob x 4 weeks. Patient had COVID x 3 weeks ago.)  Patient is accompanied by his  wife who he is okay with being present.  Sleep apnea: auto CPAP ordered by pulmonology after at home sleep study.  GERD: Patient has been trying different medications to treat acid reflux as he was curious if that is what was causing his chest pain, but nothing changed the pain he is experiencing.  New complaints: Patient complains of chest pain x2 months, he was diagnosed with COVID pneumonia 3 weeks ago.  He was treated with Tylenol and his albuterol inhaler.  The pain occurs on the left side when he moves his arm outward.  He reports it is worse at night.  He does not feel it worsens with coughing or deep breathing.  Patient also reports of pain in the side of his penis while he is getting an erection.  He states he has no pain after the erection is fully there, while he is performing, or during urination.  His wife does feel his penis is deformed now as it was not in years past.  Wife reports a "bump" in the patient's bellybutton that she would like assessed.   Social history:  Relevant past medical, surgical, family and social history reviewed and updated as indicated. Interim medical history since our last visit reviewed.  Allergies and medications reviewed and updated.  DATA REVIEWED: CHART IN EPIC  ROS: Negative unless specifically indicated above in HPI.    Current Outpatient Medications:  .  albuterol (VENTOLIN HFA) 108 (90  Base) MCG/ACT inhaler, Inhale 2 puffs into the lungs every 6 (six) hours as needed for wheezing or shortness of breath., Disp: 18 g, Rfl: 2 .  aspirin EC 81 MG tablet, Take 81 mg by mouth daily., Disp: , Rfl:  .  diclofenac (VOLTAREN) 75 MG EC tablet, TAKE ONE TABLET (75 MG TOTAL) BY MOUTH TWO TIMES DAILY., Disp: 60 tablet, Rfl: 0 .  famotidine (PEPCID) 20 MG tablet, TAKE 2 TABLETS (40 MG TOTAL) BY MOUTH TWO TIMES DAILY. (Patient taking differently: Take 20 mg by mouth daily.), Disp: 120 tablet, Rfl: 2 .  hydrochlorothiazide (HYDRODIURIL) 25 MG tablet, Take  1 tablet (25 mg total) by mouth daily., Disp: 90 tablet, Rfl: 3 .  Misc Natural Products (URINOZINC PLUS PO), Take by mouth., Disp: , Rfl:  .  Multiple Vitamins-Minerals (MULTIVITAMIN MEN PO), Take by mouth., Disp: , Rfl:  .  NEEDLE, DISP, 23 G (BD DISP NEEDLE) 23G X 1" MISC, 1 Device by Does not apply route every 14 (fourteen) days., Disp: 12 each, Rfl: 1 .  nystatin cream (MYCOSTATIN), Apply 1 application topically 2 (two) times daily. (Patient taking differently: Apply 1 application topically 2 (two) times daily as needed.), Disp: 30 g, Rfl: 1 .  pantoprazole (PROTONIX) 40 MG tablet, TAKE ONE TABLET (40MG  TOTAL) BY MOUTH DAILY, Disp: 30 tablet, Rfl: 0 .  Probiotic Product (HEALTHY COLON PO), Take by mouth., Disp: , Rfl:  .  rOPINIRole (REQUIP) 4 MG tablet, Take 1 tablet (4 mg total) by mouth at bedtime., Disp: 90 tablet, Rfl: 1 .  sildenafil (VIAGRA) 100 MG tablet, Take 0.5-1 tablets (50-100 mg total) by mouth daily as needed for erectile dysfunction., Disp: 30 tablet, Rfl: 2 .  Skin Protectants, Misc. (CALAZIME SKIN PROTECTANT) PSTE, Apply 1 application topically as needed., Disp: 113 g, Rfl: 1 .  Specialty Vitamins Products (ECHINACEA C COMPLETE PO), Take by mouth., Disp: , Rfl:  .  Syringe/Needle, Disp, 18G X 1" 3 ML MISC, 1 Device by Does not apply route every 14 (fourteen) days., Disp: 12 each, Rfl: 1 .  testosterone cypionate (DEPOTESTOSTERONE CYPIONATE) 200 MG/ML injection, Inject 1 mL (200 mg total) into the muscle every 14 (fourteen) days., Disp: 10 mL, Rfl: 2   Allergies  Allergen Reactions  . Mobic [Meloxicam] Other (See Comments)    Mouth sores   Past Medical History:  Diagnosis Date  . Enlarged prostate   . Neuropathy    RLE  . Restless leg   . Sleep apnea     Past Surgical History:  Procedure Laterality Date  . HERNIA REPAIR      Social History   Socioeconomic History  . Marital status: Married    Spouse name: Not on file  . Number of children: Not on file  .  Years of education: Not on file  . Highest education level: Not on file  Occupational History  . Not on file  Tobacco Use  . Smoking status: Former Smoker    Packs/day: 3.00    Years: 18.00    Pack years: 54.00    Types: Cigarettes    Quit date: 07/03/2007    Years since quitting: 13.0  . Smokeless tobacco: Never Used  Vaping Use  . Vaping Use: Never used  Substance and Sexual Activity  . Alcohol use: Never  . Drug use: Never  . Sexual activity: Not on file  Other Topics Concern  . Not on file  Social History Narrative  . Not on file  Social Determinants of Health   Financial Resource Strain: Not on file  Food Insecurity: Not on file  Transportation Needs: Not on file  Physical Activity: Not on file  Stress: Not on file  Social Connections: Not on file  Intimate Partner Violence: Not on file        Objective:    BP 137/88   Pulse 80   Temp 98.7 F (37.1 C) (Temporal)   Ht 5\' 11"  (1.803 m)   Wt (!) 322 lb 9.6 oz (146.3 kg)   SpO2 97%   BMI 44.99 kg/m   Wt Readings from Last 3 Encounters:  08/04/20 (!) 322 lb 9.6 oz (146.3 kg)  07/21/20 (!) 315 lb (142.9 kg)  07/07/20 (!) 315 lb (142.9 kg)    Physical Exam Vitals reviewed. Exam conducted with a chaperone present.  Constitutional:      General: He is not in acute distress.    Appearance: Normal appearance. He is morbidly obese. He is not ill-appearing, toxic-appearing or diaphoretic.  HENT:     Head: Normocephalic and atraumatic.  Eyes:     General: No scleral icterus.       Right eye: No discharge.        Left eye: No discharge.     Conjunctiva/sclera: Conjunctivae normal.  Cardiovascular:     Rate and Rhythm: Normal rate and regular rhythm.     Heart sounds: Normal heart sounds. No murmur heard. No friction rub. No gallop.   Pulmonary:     Effort: Pulmonary effort is normal. No respiratory distress.     Breath sounds: Normal breath sounds. No stridor. No wheezing, rhonchi or rales.  Abdominal:      Hernia: A hernia is present. Hernia is present in the umbilical area (small).  Genitourinary:    Pubic Area: No rash or pubic lice.      Penis: Circumcised.      Testes: Normal.     Epididymis:     Right: Normal.     Left: Normal.     Comments: Unable to visualize deformity of penis. Exam somewhat limited by patient's size.  Musculoskeletal:        General: Normal range of motion.     Cervical back: Normal range of motion.     Comments: Able to produce pain on left side of chest with palpation.  Skin:    General: Skin is warm and dry.  Neurological:     Mental Status: He is alert and oriented to person, place, and time. Mental status is at baseline.  Psychiatric:        Mood and Affect: Mood normal.        Behavior: Behavior normal.        Thought Content: Thought content normal.        Judgment: Judgment normal.     Lab Results  Component Value Date   TSH 3.370 09/16/2019   Lab Results  Component Value Date   WBC 6.6 05/12/2020   HGB 15.4 05/12/2020   HCT 44.5 05/12/2020   MCV 93 05/12/2020   PLT 222 05/12/2020   Lab Results  Component Value Date   NA 145 (H) 05/12/2020   K 4.6 05/12/2020   CO2 25 05/12/2020   GLUCOSE 81 05/12/2020   BUN 22 05/12/2020   CREATININE 1.01 05/12/2020   BILITOT 0.5 05/12/2020   ALKPHOS 86 05/12/2020   AST 24 05/12/2020   ALT 25 05/12/2020   PROT 6.9 05/12/2020   ALBUMIN 4.3  05/12/2020   CALCIUM 9.6 05/12/2020   Lab Results  Component Value Date   CHOL 164 05/12/2020   Lab Results  Component Value Date   HDL 37 (L) 05/12/2020   Lab Results  Component Value Date   LDLCALC 107 (H) 05/12/2020   Lab Results  Component Value Date   TRIG 108 05/12/2020   Lab Results  Component Value Date   CHOLHDL 4.4 05/12/2020   No results found for: HGBA1C

## 2020-08-04 NOTE — Patient Instructions (Signed)
Peyronie's disease

## 2020-08-18 ENCOUNTER — Other Ambulatory Visit: Payer: Self-pay | Admitting: Family Medicine

## 2020-08-18 DIAGNOSIS — M1990 Unspecified osteoarthritis, unspecified site: Secondary | ICD-10-CM

## 2020-09-01 ENCOUNTER — Ambulatory Visit: Payer: 59 | Admitting: Family Medicine

## 2020-09-12 ENCOUNTER — Other Ambulatory Visit: Payer: Self-pay | Admitting: Family Medicine

## 2020-09-12 DIAGNOSIS — K219 Gastro-esophageal reflux disease without esophagitis: Secondary | ICD-10-CM

## 2020-10-19 ENCOUNTER — Encounter: Payer: Self-pay | Admitting: *Deleted

## 2020-11-04 ENCOUNTER — Telehealth: Payer: Self-pay | Admitting: Pulmonary Disease

## 2020-11-04 NOTE — Telephone Encounter (Signed)
Order was sent to [facility] on 2/1 and confirmation received from [employer] on 2/3.  Tried to call pt to make him aware of recall and to give him customer service phone # for [facility] - [phone].  Had to leave vm for pt to call me back.

## 2020-11-04 NOTE — Telephone Encounter (Signed)
Spoke to pt & went over info with him.  He has [facility]'s Customer Service #.  Nothing further needed.

## 2020-11-10 ENCOUNTER — Other Ambulatory Visit: Payer: Self-pay | Admitting: Family Medicine

## 2020-11-10 DIAGNOSIS — K219 Gastro-esophageal reflux disease without esophagitis: Secondary | ICD-10-CM

## 2020-11-29 ENCOUNTER — Encounter: Payer: Self-pay | Admitting: Family Medicine

## 2020-11-29 DIAGNOSIS — M1990 Unspecified osteoarthritis, unspecified site: Secondary | ICD-10-CM

## 2020-11-29 MED ORDER — DICLOFENAC SODIUM 75 MG PO TBEC: 75.0000 mg | DELAYED_RELEASE_TABLET | Freq: Two times a day (BID) | ORAL | 2 refills | Status: DC

## 2020-12-08 ENCOUNTER — Encounter: Payer: 59 | Admitting: Family Medicine

## 2020-12-13 ENCOUNTER — Telehealth: Payer: Self-pay | Admitting: Pulmonary Disease

## 2020-12-13 DIAGNOSIS — G4733 Obstructive sleep apnea (adult) (pediatric): Secondary | ICD-10-CM

## 2020-12-13 NOTE — Telephone Encounter (Signed)
Called and spoke with patient who states CPAP machine has been on backorder, but when he spoke with [facility] they do have a different one availble and ready now. They just need authorization to give. Patient states when patient called [facility], they told him they had [employer] and [employer] available and to see if Dr would be ok with one of them.   Dr Craige Cotta please advise.

## 2020-12-13 NOTE — Telephone Encounter (Signed)
Okay to order luna device.

## 2020-12-13 NOTE — Telephone Encounter (Signed)
Called and updated patient on Dr Evlyn Courier response about being ok to order luna device. Order placed per Dr Craige Cotta and patient confirmed send to [facility]. Patient expressed full understanding. Nothing further needed at this time.

## 2020-12-14 ENCOUNTER — Other Ambulatory Visit: Payer: Self-pay | Admitting: Family Medicine

## 2020-12-14 DIAGNOSIS — R7989 Other specified abnormal findings of blood chemistry: Secondary | ICD-10-CM

## 2020-12-15 ENCOUNTER — Encounter: Payer: 59 | Admitting: Family Medicine

## 2020-12-26 ENCOUNTER — Other Ambulatory Visit: Payer: Self-pay | Admitting: Family Medicine

## 2020-12-26 DIAGNOSIS — K219 Gastro-esophageal reflux disease without esophagitis: Secondary | ICD-10-CM

## 2021-01-03 ENCOUNTER — Telehealth: Payer: Self-pay | Admitting: Pulmonary Disease

## 2021-01-03 DIAGNOSIS — G4733 Obstructive sleep apnea (adult) (pediatric): Secondary | ICD-10-CM

## 2021-01-03 NOTE — Telephone Encounter (Signed)
Okay to send order to change auto CPAP to 10 - 20 cm H2O.

## 2021-01-03 NOTE — Telephone Encounter (Signed)
Called and spoke with patient. He stated that he received his cpap machine on 12/22/20. He stated that the machine so far has been working well but he would like to have the start pressure increased to 10cm. His current start pressure is set at 5cm and it takes him a while for him to feel the air.   His current DME is [facility].   Dr. Craige Cotta, please advise if you are ok with changing the settings to 10-20cm. Thanks!

## 2021-01-04 NOTE — Telephone Encounter (Signed)
Lm for patient.  

## 2021-01-04 NOTE — Telephone Encounter (Signed)
Notified pt that Dr. Craige Cotta ok'ed C-Pap setting order changes. Order was placed with [facility]. Pt stated understanding and was also scheduled for f/u with Dr. Craige Cotta on 02/20/21 for C-Pap compliance f/u. Nothing further needed at this time.

## 2021-01-23 ENCOUNTER — Other Ambulatory Visit: Payer: Self-pay | Admitting: Family Medicine

## 2021-01-23 DIAGNOSIS — K219 Gastro-esophageal reflux disease without esophagitis: Secondary | ICD-10-CM

## 2021-02-02 ENCOUNTER — Ambulatory Visit: Payer: 59 | Admitting: Family Medicine

## 2021-02-02 ENCOUNTER — Other Ambulatory Visit: Payer: Self-pay

## 2021-02-02 ENCOUNTER — Encounter: Payer: Self-pay | Admitting: Family Medicine

## 2021-02-02 ENCOUNTER — Telehealth: Payer: Self-pay | Admitting: Family Medicine

## 2021-02-02 VITALS — BP 136/80 | HR 75 | Temp 98.1°F | Ht 71.0 in | Wt 318.0 lb

## 2021-02-02 DIAGNOSIS — J449 Chronic obstructive pulmonary disease, unspecified: Secondary | ICD-10-CM

## 2021-02-02 DIAGNOSIS — R454 Irritability and anger: Secondary | ICD-10-CM

## 2021-02-02 DIAGNOSIS — I10 Essential (primary) hypertension: Secondary | ICD-10-CM

## 2021-02-02 DIAGNOSIS — N400 Enlarged prostate without lower urinary tract symptoms: Secondary | ICD-10-CM

## 2021-02-02 DIAGNOSIS — R7989 Other specified abnormal findings of blood chemistry: Secondary | ICD-10-CM

## 2021-02-02 DIAGNOSIS — G4733 Obstructive sleep apnea (adult) (pediatric): Secondary | ICD-10-CM

## 2021-02-02 DIAGNOSIS — Z79899 Other long term (current) drug therapy: Secondary | ICD-10-CM

## 2021-02-02 DIAGNOSIS — N529 Male erectile dysfunction, unspecified: Secondary | ICD-10-CM

## 2021-02-02 DIAGNOSIS — K219 Gastro-esophageal reflux disease without esophagitis: Secondary | ICD-10-CM | POA: Diagnosis not present

## 2021-02-02 DIAGNOSIS — G2581 Restless legs syndrome: Secondary | ICD-10-CM

## 2021-02-02 DIAGNOSIS — Z9989 Dependence on other enabling machines and devices: Secondary | ICD-10-CM

## 2021-02-02 DIAGNOSIS — J302 Other seasonal allergic rhinitis: Secondary | ICD-10-CM

## 2021-02-02 MED ORDER — FLUOXETINE HCL 20 MG PO CAPS: 20.0000 mg | ORAL_CAPSULE | Freq: Every day | ORAL | 2 refills | Status: DC

## 2021-02-02 MED ORDER — ROPINIROLE HCL 4 MG PO TABS: 4.0000 mg | ORAL_TABLET | Freq: Every day | ORAL | 1 refills | Status: DC

## 2021-02-02 MED ORDER — HYDROCHLOROTHIAZIDE 50 MG PO TABS
50.0000 mg | ORAL_TABLET | Freq: Every day | ORAL | 2 refills | Status: DC
Start: 2021-02-02 — End: 2021-04-18

## 2021-02-02 MED ORDER — TESTOSTERONE CYPIONATE 200 MG/ML IM SOLN: INTRAMUSCULAR | 2 refills | Status: DC

## 2021-02-02 MED ORDER — FAMOTIDINE 20 MG PO TABS: 60.0000 mg | ORAL_TABLET | Freq: Every day | ORAL | 1 refills | Status: DC

## 2021-02-02 MED ORDER — SPIRIVA RESPIMAT 2.5 MCG/ACT IN AERS: 2.0000 | INHALATION_SPRAY | Freq: Every day | RESPIRATORY_TRACT | 2 refills | Status: DC

## 2021-02-02 MED ORDER — LEVOCETIRIZINE DIHYDROCHLORIDE 5 MG PO TABS
5.0000 mg | ORAL_TABLET | Freq: Every evening | ORAL | 2 refills | Status: DC
Start: 2021-02-02 — End: 2021-04-18

## 2021-02-02 MED ORDER — PANTOPRAZOLE SODIUM 40 MG PO TBEC: DELAYED_RELEASE_TABLET | ORAL | 1 refills | Status: DC

## 2021-02-02 MED ORDER — FAMOTIDINE 40 MG PO TABS: 60.0000 mg | ORAL_TABLET | Freq: Every day | ORAL | 1 refills | Status: DC

## 2021-02-02 NOTE — Patient Instructions (Addendum)
Reminder: please return after mid-September for your flu shot. If you receive this elsewhere, such as your pharmacy, please let us know so we can get this documented in your chart.   Ask your pharmacist to price check Shingrix. If affordable, they will give you at the pharmacy. Please let us know when you receive, so we can update your record.  You can take two of your 25 mg hydrochlorothiazide tablets to use them up. Your new prescription will be a 50 mg tablet.

## 2021-02-02 NOTE — Telephone Encounter (Signed)
Pharmacy wanted to let provider know that there is a drug interaction between Prozac & diclofenac- increased risk of upper GI bleed  Please advise if medication should be filled or changed

## 2021-02-02 NOTE — Progress Notes (Signed)
Assessment & Plan:  1. Essential hypertension - well managed on current regimen - hydrochlorothiazide (HYDRODIURIL) 50 MG tablet; Take 1 tablet (50 mg total) by mouth daily.  Dispense: 30 tablet; Refill: 2 - CBC with Differential/Platelet; Future - CMP14+EGFR; Future - Lipid panel; Future - printed education provided  2. OSA on CPAP - follows up with pulmonology on 02/20/21 - is wearing CPAP nightly  3. Morbid obesity ([place]) - encouraged lifestyle modifications - insurance will not cover GLP; not a good candidate for phentermine - printed education provided  4. Gastroesophageal reflux disease, unspecified whether esophagitis present - well managed on current regimen - pantoprazole (PROTONIX) 40 MG tablet; TAKE ONE TABLET (40MG TOTAL) BY MOUTH DAILY  Dispense: 90 tablet; Refill: 1 - CMP14+EGFR; Future - famotidine (PEPCID) 20 MG tablet; Take 3 tablets (60 mg total) by mouth at bedtime.  Dispense: 270 tablet; Refill: 1  5. Low testosterone in male - testosterone cypionate (DEPOTESTOSTERONE CYPIONATE) 200 MG/ML injection; INJECT 1ML (200MG TOTAL) INTO THE MUSCLE EVERY 14 DAYS  Dispense: 10 mL; Refill: 2 - Testosterone,Free and Total; Future - Ambulatory referral to Urology - Drug Screen 10 W/Conf, Se - controlled substance agreement signed today. Blood drug screen obtained as pt stated he was unable to urinate. - PDMP reviewed with no concerning findings.  6. Controlled substance agreement signed - testosterone cypionate (DEPOTESTOSTERONE CYPIONATE) 200 MG/ML injection; INJECT 1ML (200MG TOTAL) INTO THE MUSCLE EVERY 14 DAYS  Dispense: 10 mL; Refill: 2 - Drug Screen 10 W/Conf, Se  7. Erectile dysfunction, unspecified erectile dysfunction type - previously on tadalafil, stated it was ineffective and caused headaches - resumed sildenafil as needed  8. Enlarged prostate - PSA, total and free; Future - Ambulatory referral to Urology  9. Restless leg - moderately managed on  current management, he does not want to adjust therapy  - declined referral to neurology - rOPINIRole (REQUIP) 4 MG tablet; Take 1 tablet (4 mg total) by mouth at bedtime.  Dispense: 90 tablet; Refill: 1 - CMP14+EGFR; Future  10. COPD without exacerbation ([address]) - not on previous long acting inhaler, has been using albuterol daily with minimal relief - Tiotropium Bromide Monohydrate (SPIRIVA RESPIMAT) 2.5 MCG/ACT AERS; Inhale 2 puffs into the lungs daily.  Dispense: 4 g; Refill: 2 - printed education provided  11. Seasonal allergies - frequent congestion  - levocetirizine (XYZAL) 5 MG tablet; Take 1 tablet (5 mg total) by mouth every evening.  Dispense: 30 tablet; Refill: 2  12. Feeling angry - increased irritability and quick temper - FLUoxetine (PROZAC) 20 MG capsule; Take 1 capsule (20 mg total) by mouth daily.  Dispense: 30 capsule; Refill: 2   13. Peripheral edema - edema in hands and feet - increase hydrochlorothiazide   14. Umbilical Hernia  - Ambulatory referral to General Surgery  15. Diastatis recti - educated on diet and exercise    Return in about 6 weeks (around 03/16/2021) for COPD, edema.  Lucile Crater, NP Student  Subjective:    Patient ID: Jesse Castillo, male    DOB: 1965-09-17, 55 y.o.   MRN: [id number]  Patient Care Team: Loman Brooklyn, FNP as PCP - General (Family Medicine)   Chief Complaint:  Chief Complaint  Patient presents with   Hypertension    Check up of chronic medical conditions    Hernia    Patient states he has surgery 10 years ago and thinks his hernia has came back     Shortness of Breath  Patient has COPD and states that his SOB and balance has been worse     HPI: YOUSEF HUGE is a 55 y.o. male presenting on 02/02/2021 for Hypertension (Check up of chronic medical conditions ), Hernia (Patient states he has surgery 10 years ago and thinks his hernia has came back /), and Shortness of Breath (Patient has COPD and states that his  SOB and balance has been worse )  Patient is accompanied by his wife who he is okay with being present.  Low testosterone, giving himself testosterone injections as ordered. He has never been evaluated by urology or endocrinology.   RLS: does not feel like his requip helps all the time. Previously failed mirapex.   GERD:  taking 60 mg famotidine at night, 40 mg protonix in the morning  New complaints: Patient complains of intermittent shortness of breath and has been using his albuterol inhaler every morning with no relief. He states he was diagnosed with COPD while in prison many years ago. He states that he has difficulty sleeping while laying down, reports anxiety and a "fear that he may not wake up." He states he does not believe that his CPAP is helping and that he has an appointment with his pulmonologist later this month.  Patient is concerned about the umbilical hernia found during the last visit, and a possible new hernia in his mid epigastric region.   Patient is requesting allergy medication due to frequent congestion and cough.   Patient is requesting an increase in his diuretic dose due to hands and feet swelling.   Patient states he would like to switch back to sildenafil as his tadalafil is ineffective and causes headaches.  Patient is requesting his PSA level to be checked.  Patient is concerned about his temper. He states he is quick to irritability and anger at small things.  Depression screen [facility] [place] 2/9 02/02/2021 08/04/2020 04/29/2020  Decreased Interest 1 0 0  Down, Depressed, Hopeless 0 0 0  PHQ - 2 Score 1 0 0  Altered sleeping 3 - -  Tired, decreased energy 1 - -  Change in appetite 2 - -  Feeling bad or failure about yourself  0 - -  Trouble concentrating 2 - -  Moving slowly or fidgety/restless 1 - -  Suicidal thoughts 0 - -  PHQ-9 Score 10 - -  Difficult doing work/chores Somewhat difficult - -    GAD 7 : Generalized Anxiety Score 02/02/2021  Nervous,  Anxious, on Edge 2  Control/stop worrying 2  Worry too much - different things 2  Trouble relaxing 1  Restless 2  Easily annoyed or irritable 3  Afraid - awful might happen 0  Total GAD 7 Score 12  Anxiety Difficulty Somewhat difficult      Social history:  Relevant past medical, surgical, family and social history reviewed and updated as indicated. Interim medical history since our last visit reviewed.  Allergies and medications reviewed and updated.  DATA REVIEWED: CHART IN EPIC  ROS: Negative unless specifically indicated above in HPI.    Current Outpatient Medications:    albuterol (VENTOLIN HFA) 108 (90 Base) MCG/ACT inhaler, Inhale 2 puffs into the lungs every 6 (six) hours as needed for wheezing or shortness of breath., Disp: 18 g, Rfl: 2   aspirin EC 81 MG tablet, Take 81 mg by mouth daily., Disp: , Rfl:    diclofenac (VOLTAREN) 75 MG EC tablet, Take 1 tablet (75 mg total) by mouth 2 (two) times  daily., Disp: 60 tablet, Rfl: 2   FLUoxetine (PROZAC) 20 MG capsule, Take 1 capsule (20 mg total) by mouth daily., Disp: 30 capsule, Rfl: 2   levocetirizine (XYZAL) 5 MG tablet, Take 1 tablet (5 mg total) by mouth every evening., Disp: 30 tablet, Rfl: 2   Misc Natural Products (URINOZINC PLUS PO), Take by mouth., Disp: , Rfl:    Multiple Vitamins-Minerals (MULTIVITAMIN MEN PO), Take by mouth., Disp: , Rfl:    NEEDLE, DISP, 23 G (BD DISP NEEDLE) 23G X 1" MISC, 1 Device by Does not apply route every 14 (fourteen) days., Disp: 12 each, Rfl: 1   nystatin cream (MYCOSTATIN), Apply 1 application topically 2 (two) times daily. (Patient taking differently: Apply 1 application topically 2 (two) times daily as needed.), Disp: 30 g, Rfl: 1   Probiotic Product (HEALTHY COLON PO), Take by mouth., Disp: , Rfl:    sildenafil (VIAGRA) 100 MG tablet, Take 100 mg by mouth daily as needed., Disp: , Rfl:    Specialty Vitamins Products (ECHINACEA C COMPLETE PO), Take by mouth., Disp: , Rfl:     Syringe/Needle, Disp, 18G X 1" 3 ML MISC, 1 Device by Does not apply route every 14 (fourteen) days., Disp: 12 each, Rfl: 1   Tiotropium Bromide Monohydrate (SPIRIVA RESPIMAT) 2.5 MCG/ACT AERS, Inhale 2 puffs into the lungs daily., Disp: 4 g, Rfl: 2   famotidine (PEPCID) 20 MG tablet, Take 3 tablets (60 mg total) by mouth at bedtime., Disp: 270 tablet, Rfl: 1   hydrochlorothiazide (HYDRODIURIL) 50 MG tablet, Take 1 tablet (50 mg total) by mouth daily., Disp: 30 tablet, Rfl: 2   pantoprazole (PROTONIX) 40 MG tablet, TAKE ONE TABLET (40MG TOTAL) BY MOUTH DAILY, Disp: 90 tablet, Rfl: 1   rOPINIRole (REQUIP) 4 MG tablet, Take 1 tablet (4 mg total) by mouth at bedtime., Disp: 90 tablet, Rfl: 1   testosterone cypionate (DEPOTESTOSTERONE CYPIONATE) 200 MG/ML injection, INJECT 1ML (200MG TOTAL) INTO THE MUSCLE EVERY 14 DAYS, Disp: 10 mL, Rfl: 2   Allergies  Allergen Reactions   Mobic [Meloxicam] Other (See Comments)    Mouth sores   Past Medical History:  Diagnosis Date   Enlarged prostate    Neuropathy    RLE   Restless leg    Sleep apnea     Past Surgical History:  Procedure Laterality Date   HERNIA REPAIR      Social History   Socioeconomic History   Marital status: Married    Spouse name: Not on file   Number of children: Not on file   Years of education: Not on file   Highest education level: Not on file  Occupational History   Not on file  Tobacco Use   Smoking status: Former    Packs/day: 3.00    Years: 18.00    Pack years: 54.00    Types: Cigarettes    Quit date: 07/03/2007    Years since quitting: 13.5   Smokeless tobacco: Never  Vaping Use   Vaping Use: Never used  Substance and Sexual Activity   Alcohol use: Never   Drug use: Never   Sexual activity: Not on file  Other Topics Concern   Not on file  Social History Narrative   Not on file   Social Determinants of Health   Financial Resource Strain: Not on file  Food Insecurity: Not on file  Transportation  Needs: Not on file  Physical Activity: Not on file  Stress: Not on file  Social Connections:  Not on file  Intimate Partner Violence: Not on file        Objective:    BP 136/80   Pulse 75   Temp 98.1 F (36.7 C) (Temporal)   Ht _0  (1.803 m)   Wt (!) 144.2 kg   SpO2 97%   BMI 44.35 kg/m   Wt Readings from Last 3 Encounters:  02/02/21 (!) 144.2 kg  08/04/20 (!) 146.3 kg  07/21/20 (!) 142.9 kg    Physical Exam Vitals reviewed.  Constitutional:      General: He is not in acute distress.    Appearance: Normal appearance. He is morbidly obese. He is not ill-appearing, toxic-appearing or diaphoretic.  HENT:     Head: Normocephalic and atraumatic.  Eyes:     General: No scleral icterus.       Right eye: No discharge.        Left eye: No discharge.     Conjunctiva/sclera: Conjunctivae normal.  Cardiovascular:     Rate and Rhythm: Normal rate and regular rhythm.     Heart sounds: Normal heart sounds. No murmur heard.   No friction rub. No gallop.  Pulmonary:     Effort: Pulmonary effort is normal. No respiratory distress.     Breath sounds: Normal breath sounds. No stridor. No wheezing, rhonchi or rales.  Abdominal:     General: Bowel sounds are normal.     Palpations: Abdomen is soft.     Hernia: A hernia is present. Umbilical: small.     Comments: Diastatis recti and umbilical hernia  Genitourinary:    Pubic Area: No rash or pubic lice.   Musculoskeletal:        General: Normal range of motion.     Cervical back: Normal range of motion.     Comments: Able to produce pain on left side of chest with palpation.  Skin:    General: Skin is warm and dry.  Neurological:     Mental Status: He is alert and oriented to person, place, and time. Mental status is at baseline.  Psychiatric:        Mood and Affect: Mood normal.        Behavior: Behavior normal.        Thought Content: Thought content normal.        Judgment: Judgment normal.    Lab Results  Component  Value Date   TSH 3.370 09/16/2019   Lab Results  Component Value Date   WBC 6.6 05/12/2020   HGB 15.4 05/12/2020   HCT 44.5 05/12/2020   MCV 93 05/12/2020   PLT 222 05/12/2020   Lab Results  Component Value Date   NA 145 (H) 05/12/2020   K 4.6 05/12/2020   CO2 25 05/12/2020   GLUCOSE 81 05/12/2020   BUN 22 05/12/2020   CREATININE 1.01 05/12/2020   BILITOT 0.5 05/12/2020   ALKPHOS 86 05/12/2020   AST 24 05/12/2020   ALT 25 05/12/2020   PROT 6.9 05/12/2020   ALBUMIN 4.3 05/12/2020   CALCIUM 9.6 05/12/2020   Lab Results  Component Value Date   CHOL 164 05/12/2020   Lab Results  Component Value Date   HDL 37 (L) 05/12/2020   Lab Results  Component Value Date   LDLCALC 107 (H) 05/12/2020   Lab Results  Component Value Date   TRIG 108 05/12/2020   Lab Results  Component Value Date   CHOLHDL 4.4 05/12/2020   No results found for: HGBA1C

## 2021-02-03 MED ORDER — FLUTICASONE-SALMETEROL 250-50 MCG/ACT IN AEPB: 1.0000 | INHALATION_SPRAY | Freq: Two times a day (BID) | RESPIRATORY_TRACT | 2 refills | Status: DC

## 2021-02-03 MED ORDER — BUPROPION HCL ER (XL) 150 MG PO TB24: 150.0000 mg | ORAL_TABLET | Freq: Every day | ORAL | 2 refills | Status: DC

## 2021-02-03 NOTE — Telephone Encounter (Signed)
Pharmacy aware

## 2021-02-03 NOTE — Telephone Encounter (Signed)
I changed it to Wellbutrin. Please do not fill.

## 2021-02-08 ENCOUNTER — Other Ambulatory Visit: Payer: 59

## 2021-02-08 ENCOUNTER — Other Ambulatory Visit: Payer: Self-pay

## 2021-02-08 DIAGNOSIS — K219 Gastro-esophageal reflux disease without esophagitis: Secondary | ICD-10-CM

## 2021-02-08 DIAGNOSIS — R7989 Other specified abnormal findings of blood chemistry: Secondary | ICD-10-CM

## 2021-02-08 DIAGNOSIS — G2581 Restless legs syndrome: Secondary | ICD-10-CM

## 2021-02-08 DIAGNOSIS — N400 Enlarged prostate without lower urinary tract symptoms: Secondary | ICD-10-CM

## 2021-02-08 DIAGNOSIS — I10 Essential (primary) hypertension: Secondary | ICD-10-CM

## 2021-02-09 LAB — DRUG SCREEN 10 W/CONF, SERUM
Amphetamines, IA: NEGATIVE ng/mL
Barbiturates, IA: NEGATIVE ug/mL
Benzodiazepines, IA: NEGATIVE ng/mL
Cocaine & Metabolite, IA: NEGATIVE ng/mL
Methadone, IA: NEGATIVE ng/mL
Opiates, IA: NEGATIVE ng/mL
Oxycodones, IA: NEGATIVE ng/mL
Phencyclidine, IA: NEGATIVE ng/mL
Propoxyphene, IA: NEGATIVE ng/mL
THC(Marijuana) Metabolite, IA: NEGATIVE ng/mL

## 2021-02-12 LAB — CBC WITH DIFFERENTIAL/PLATELET
Basophils Absolute: 0 10*3/uL (ref 0.0–0.2)
Basos: 0 %
EOS (ABSOLUTE): 0.1 10*3/uL (ref 0.0–0.4)
Eos: 2 %
Hematocrit: 52.4 % — ABNORMAL HIGH (ref 37.5–51.0)
Hemoglobin: 17.7 g/dL (ref 13.0–17.7)
Immature Grans (Abs): 0 10*3/uL (ref 0.0–0.1)
Immature Granulocytes: 0 %
Lymphocytes Absolute: 1.6 10*3/uL (ref 0.7–3.1)
Lymphs: 22 %
MCH: 31.4 pg (ref 26.6–33.0)
MCHC: 33.8 g/dL (ref 31.5–35.7)
MCV: 93 fL (ref 79–97)
Monocytes Absolute: 1.2 10*3/uL — ABNORMAL HIGH (ref 0.1–0.9)
Monocytes: 17 %
Neutrophils Absolute: 4.3 10*3/uL (ref 1.4–7.0)
Neutrophils: 59 %
Platelets: 227 10*3/uL (ref 150–450)
RBC: 5.63 x10E6/uL (ref 4.14–5.80)
RDW: 13 % (ref 11.6–15.4)
WBC: 7.3 10*3/uL (ref 3.4–10.8)

## 2021-02-12 LAB — CMP14+EGFR
ALT: 26 IU/L (ref 0–44)
AST: 22 IU/L (ref 0–40)
Albumin/Globulin Ratio: 1.4 (ref 1.2–2.2)
Albumin: 4.2 g/dL (ref 3.8–4.9)
Alkaline Phosphatase: 68 IU/L (ref 44–121)
BUN/Creatinine Ratio: 13 (ref 9–20)
BUN: 14 mg/dL (ref 6–24)
Bilirubin Total: 0.8 mg/dL (ref 0.0–1.2)
CO2: 23 mmol/L (ref 20–29)
Calcium: 9.6 mg/dL (ref 8.7–10.2)
Chloride: 101 mmol/L (ref 96–106)
Creatinine, Ser: 1.11 mg/dL (ref 0.76–1.27)
Globulin, Total: 2.9 g/dL (ref 1.5–4.5)
Glucose: 96 mg/dL (ref 65–99)
Potassium: 4.5 mmol/L (ref 3.5–5.2)
Sodium: 140 mmol/L (ref 134–144)
Total Protein: 7.1 g/dL (ref 6.0–8.5)
eGFR: 80 mL/min/{1.73_m2} (ref >59)

## 2021-02-12 LAB — TESTOSTERONE,FREE AND TOTAL
Testosterone, Free: 16.8 pg/mL (ref 7.2–24.0)
Testosterone: 687 ng/dL (ref 264–916)

## 2021-02-12 LAB — LIPID PANEL
Chol/HDL Ratio: 5.1 ratio — ABNORMAL HIGH (ref 0.0–5.0)
Cholesterol, Total: 172 mg/dL (ref 100–199)
HDL: 34 mg/dL — ABNORMAL LOW (ref >39)
LDL Chol Calc (NIH): 113 mg/dL — ABNORMAL HIGH (ref 0–99)
Triglycerides: 138 mg/dL (ref 0–149)
VLDL Cholesterol Cal: 25 mg/dL (ref 5–40)

## 2021-02-12 LAB — PSA, TOTAL AND FREE
PSA, Free Pct: 38.8 %
PSA, Free: 0.31 ng/mL
Prostate Specific Ag, Serum: 0.8 ng/mL (ref 0.0–4.0)

## 2021-02-20 ENCOUNTER — Ambulatory Visit: Payer: 59 | Admitting: Pulmonary Disease

## 2021-02-20 ENCOUNTER — Other Ambulatory Visit: Payer: Self-pay

## 2021-02-20 ENCOUNTER — Encounter: Payer: Self-pay | Admitting: Pulmonary Disease

## 2021-02-20 VITALS — BP 140/98 | HR 75 | Temp 97.1°F | Ht 71.0 in | Wt 315.0 lb

## 2021-02-20 DIAGNOSIS — E669 Obesity, unspecified: Secondary | ICD-10-CM

## 2021-02-20 DIAGNOSIS — G4730 Sleep apnea, unspecified: Secondary | ICD-10-CM

## 2021-02-20 DIAGNOSIS — G4733 Obstructive sleep apnea (adult) (pediatric): Secondary | ICD-10-CM | POA: Diagnosis not present

## 2021-02-20 DIAGNOSIS — F5101 Primary insomnia: Secondary | ICD-10-CM | POA: Diagnosis not present

## 2021-02-20 NOTE — Progress Notes (Signed)
[facility]  Chief Complaint  Patient presents with   Follow-up    Patient states that he can get about 4 hours with Cpap machine on but then sleeps on and off. States that he is using his old mask.     Constitutional:  BP (!) 140/98 (BP Location: Left Arm, Patient Position: Sitting, Cuff Size: Large)   Pulse 75   Temp (!) 97.1 F (36.2 C) (Oral)   Ht 5\' 11"  (1.803 m)   Wt (!) 315 lb (142.9 kg)   SpO2 98%   BMI 43.93 kg/m   Past Medical History:  BPH, GERD, HTN, ED, Low T  Past Surgical History:  He  has a past surgical history that includes Hernia repair.  Brief Summary:  Jesse Castillo is a 55 y.o. male former smoker with obstructive sleep apnea and restless leg syndrome.  He has DOT license.      Subjective:   He had home sleep study in January.  Showed moderate sleep apnea.  Had CPAP setting changed to 10 - 20 cm H2O in July.  He is doing well with CPAP.    He is having trouble staying asleep.  He has lots of stress at home since his mother in law had a stroke and is living with him.  He was started on wellbutrin couple of weeks ago.  Physical Exam:   Appearance - well kempt   ENMT - no sinus tenderness, no oral exudate, no LAN, Mallampati 3 airway, no stridor  Respiratory - equal breath sounds bilaterally, no wheezing or rales  CV - s1s2 regular rate and rhythm, no murmurs  Ext - no clubbing, no edema  Skin - no rashes  Psych - normal mood and affect   Sleep Tests:  HST 07/27/20 >> AHI AHI 20, SpO2 low 86%. Auto CPAP 01/18/21 to 02/16/21 >> used on 30 of 30 nights with average 7 hrs 46 min.  Average AHI 3 with median CPAP 11 and 95 th percentile CPAP 14 cm H2O  Social History:  He  reports that he quit smoking about 13 years ago. His smoking use included cigarettes. He has a 54.00 pack-year smoking history. He has never used smokeless tobacco. He reports that he does not drink alcohol and does not use  drugs.  Family History:  His family history includes Heart disease in his maternal grandmother and mother; Hypertension in his daughter; Parkinson's disease in his paternal grandmother; Tremor in his father.     Assessment/Plan:   Obstructive sleep apnea. - he is compliant with therapy and reports benefit from CPAP - he uses [employer] for his DME - continue auto CPAP 10 to 20 cm H2O  Sleep maintenance insomnia. - explained how stress and anxiety can lead to imbalance in cortisol production with can then impact other hormonal circadian rhythm. - explained he needs more time to determine if wellbutrin is effective in managing his anxiety - advised him to try exercising on regular basis, eat small protein snack before going to bed, and try deep breathing exercises - defer additional sleep aide medications for now  Restless leg syndrome. - f/u with PCP  Obesity. - he is aware of how his weight can impact his health  Time Spent Involved in Patient Care on Day of Examination:  27 minutes  Follow up:   Patient Instructions  Try deep breathing exercises and exercising on regular basis  Try eating a high protein snack about an  hour before going to get for the next few weeks  Follow up in 2 months  Medication List:   Allergies as of 02/20/2021       Reactions   Mobic [meloxicam] Other (See Comments)   Mouth sores        Medication List        Accurate as of February 20, 2021 10:04 AM. If you have any questions, ask your nurse or doctor.          albuterol 108 (90 Base) MCG/ACT inhaler Commonly known as: VENTOLIN HFA Inhale 2 puffs into the lungs every 6 (six) hours as needed for wheezing or shortness of breath.   aspirin EC 81 MG tablet Take 81 mg by mouth daily.   BD Disp Needle 23G X 1" Misc Generic drug: NEEDLE (DISP) 23 G 1 Device by Does not apply route every 14 (fourteen) days.   buPROPion 150 MG 24 hr tablet Commonly known as: Wellbutrin XL Take 1 tablet  (150 mg total) by mouth daily.   diclofenac 75 MG EC tablet Commonly known as: VOLTAREN Take 1 tablet (75 mg total) by mouth 2 (two) times daily.   ECHINACEA C COMPLETE PO Take by mouth.   famotidine 20 MG tablet Commonly known as: PEPCID Take 3 tablets (60 mg total) by mouth at bedtime.   fluticasone-salmeterol 250-50 MCG/ACT Aepb Commonly known as: Advair Diskus Inhale 1 puff into the lungs in the morning and at bedtime.   HEALTHY COLON PO Take by mouth.   hydrochlorothiazide 50 MG tablet Commonly known as: HYDRODIURIL Take 1 tablet (50 mg total) by mouth daily.   levocetirizine 5 MG tablet Commonly known as: XYZAL Take 1 tablet (5 mg total) by mouth every evening.   MULTIVITAMIN MEN PO Take by mouth.   nystatin cream Commonly known as: MYCOSTATIN Apply 1 application topically 2 (two) times daily. What changed:  when to take this reasons to take this   pantoprazole 40 MG tablet Commonly known as: PROTONIX TAKE ONE TABLET (40MG  TOTAL) BY MOUTH DAILY   rOPINIRole 4 MG tablet Commonly known as: REQUIP Take 1 tablet (4 mg total) by mouth at bedtime.   sildenafil 100 MG tablet Commonly known as: VIAGRA Take 100 mg by mouth daily as needed.   Spiriva Respimat 2.5 MCG/ACT Aers Generic drug: Tiotropium Bromide Monohydrate Inhale 2 puffs into the lungs daily.   Syringe/Needle (Disp) 18G X 1" 3 ML Misc 1 Device by Does not apply route every 14 (fourteen) days.   testosterone cypionate 200 MG/ML injection Commonly known as: DEPOTESTOSTERONE CYPIONATE INJECT (200MG  TOTAL) INTO THE MUSCLE EVERY 14 DAYS   URINOZINC PLUS PO Take by mouth.        Signature:  , MD [facility] Pager - [phone] 02/20/2021, 10:04 AM

## 2021-02-20 NOTE — Patient Instructions (Signed)
Try deep breathing exercises and exercising on regular basis  Try eating a high protein snack about an hour before going to get for the next few weeks  Follow up in 2 months

## 2021-02-21 ENCOUNTER — Other Ambulatory Visit: Payer: Self-pay | Admitting: *Deleted

## 2021-02-21 DIAGNOSIS — R7989 Other specified abnormal findings of blood chemistry: Secondary | ICD-10-CM

## 2021-02-21 MED ORDER — "BD DISP NEEDLE 23G X 1"" MISC": 1 refills | Status: DC

## 2021-02-22 ENCOUNTER — Other Ambulatory Visit: Payer: Self-pay | Admitting: Family Medicine

## 2021-02-22 DIAGNOSIS — M1990 Unspecified osteoarthritis, unspecified site: Secondary | ICD-10-CM

## 2021-03-15 ENCOUNTER — Ambulatory Visit: Payer: 59 | Admitting: Family Medicine

## 2021-04-06 ENCOUNTER — Ambulatory Visit (INDEPENDENT_AMBULATORY_CARE_PROVIDER_SITE_OTHER): Payer: 59 | Admitting: Urology

## 2021-04-06 ENCOUNTER — Other Ambulatory Visit: Payer: Self-pay

## 2021-04-06 ENCOUNTER — Encounter: Payer: Self-pay | Admitting: Urology

## 2021-04-06 VITALS — BP 134/87 | HR 86 | Temp 97.9°F | Ht 71.0 in | Wt 320.0 lb

## 2021-04-06 DIAGNOSIS — R3915 Urgency of urination: Secondary | ICD-10-CM | POA: Diagnosis not present

## 2021-04-06 DIAGNOSIS — N401 Enlarged prostate with lower urinary tract symptoms: Secondary | ICD-10-CM

## 2021-04-06 DIAGNOSIS — N138 Other obstructive and reflux uropathy: Secondary | ICD-10-CM

## 2021-04-06 DIAGNOSIS — E291 Testicular hypofunction: Secondary | ICD-10-CM

## 2021-04-06 DIAGNOSIS — D751 Secondary polycythemia: Secondary | ICD-10-CM

## 2021-04-06 DIAGNOSIS — N5201 Erectile dysfunction due to arterial insufficiency: Secondary | ICD-10-CM

## 2021-04-06 MED ORDER — TADALAFIL 5 MG PO TABS: 5.0000 mg | ORAL_TABLET | Freq: Every day | ORAL | 11 refills | Status: DC | PRN

## 2021-04-06 NOTE — Progress Notes (Signed)
Subjective: 1. Hypogonadism in male   2. Urgency of urination   3. BPH with urinary obstruction   4. Secondary polycythemia   5. Erectile dysfunction due to arterial insufficiency      Consult requested by Hendricks Limes NP  Jesse Castillo is a 55 yo male who is sent for a history of hypogonadism that is being managed with Testosterone cypionate 242m q2wks.  His level was 687 on 02/08/21, but he doesn't know when the injection was prior to the blood draw.  His Hgb was 17.7 which was up from 15.4 in 11/21.  He has improved energy with the med.  His PSA was 0.8 on 02/08/21.  He has moderate LUTS with an IPSS of 15.  He can have some urgency and then not need to void.  He has some post void dribbling. He has no prior stones, UTI's or GU surgery.   He has ED and is on sildenafil 1031mdaily with a variable response.  He has tried tadalafil without response.  He doesn't have any problems with the injections.  He has had some increased irritability for the last 1.52m452month  He is seeing a therapist for that.   ROS:  ROS  Allergies  Allergen Reactions   Mobic [Meloxicam] Other (See Comments)    Mouth sores    Past Medical History:  Diagnosis Date   Enlarged prostate    Neuropathy    RLE   Restless leg    Sleep apnea     Past Surgical History:  Procedure Laterality Date   HERNIA REPAIR      Social History   Socioeconomic History   Marital status: Married    Spouse name: Not on file   Number of children: Not on file   Years of education: Not on file   Highest education level: Not on file  Occupational History   Not on file  Tobacco Use   Smoking status: Former    Packs/day: 3.00    Years: 18.00    Pack years: 54.00    Types: Cigarettes    Quit date: 07/03/2007    Years since quitting: 13.7   Smokeless tobacco: Never  Vaping Use   Vaping Use: Never used  Substance and Sexual Activity   Alcohol use: Never   Drug use: Never   Sexual activity: Not on file  Other Topics Concern    Not on file  Social History Narrative   Not on file   Social Determinants of Health   Financial Resource Strain: Not on file  Food Insecurity: Not on file  Transportation Needs: Not on file  Physical Activity: Not on file  Stress: Not on file  Social Connections: Not on file  Intimate Partner Violence: Not on file    Family History  Problem Relation Age of Onset   Heart disease Mother    Tremor Father    Hypertension Daughter    Heart disease Maternal Grandmother    Parkinson's disease Paternal Grandmother     Anti-infectives: Anti-infectives (From admission, onward)    None       Current Outpatient Medications  Medication Sig Dispense Refill   aspirin EC 81 MG tablet Take 81 mg by mouth daily.     buPROPion (WELLBUTRIN XL) 150 MG 24 hr tablet Take 1 tablet (150 mg total) by mouth daily. 30 tablet 2   diclofenac (VOLTAREN) 75 MG EC tablet TAKE ONE TABLET (75MG TOTAL) BY MOUTH TWO TIMES DAILY 180 tablet 1  famotidine (PEPCID) 20 MG tablet Take 3 tablets (60 mg total) by mouth at bedtime. 270 tablet 1   hydrochlorothiazide (HYDRODIURIL) 50 MG tablet Take 1 tablet (50 mg total) by mouth daily. 30 tablet 2   levocetirizine (XYZAL) 5 MG tablet Take 1 tablet (5 mg total) by mouth every evening. 30 tablet 2   Misc Natural Products (URINOZINC PLUS PO) Take by mouth.     Multiple Vitamins-Minerals (MULTIVITAMIN MEN PO) Take by mouth.     NEEDLE, DISP, 23 G (BD DISP NEEDLE) 23G X 1" MISC Use every 14 days with testosterone 50 each 1   pantoprazole (PROTONIX) 40 MG tablet TAKE ONE TABLET (40MG TOTAL) BY MOUTH DAILY 90 tablet 1   Probiotic Product (HEALTHY COLON PO) Take by mouth.     rOPINIRole (REQUIP) 4 MG tablet Take 1 tablet (4 mg total) by mouth at bedtime. 90 tablet 1   sildenafil (VIAGRA) 100 MG tablet Take 100 mg by mouth daily as needed.     Specialty Vitamins Products (ECHINACEA C COMPLETE PO) Take by mouth.     Syringe/Needle, Disp, 18G X 1" 3 ML MISC 1 Device by Does  not apply route every 14 (fourteen) days. 12 each 1   tadalafil (CIALIS) 5 MG tablet Take 1 tablet (5 mg total) by mouth daily as needed for erectile dysfunction. 30 tablet 11   testosterone cypionate (DEPOTESTOSTERONE CYPIONATE) 200 MG/ML injection INJECT 1ML (200MG TOTAL) INTO THE MUSCLE EVERY 14 DAYS 10 mL 2   No current facility-administered medications for this visit.     Objective: Vital signs in last 24 hours: BP 134/87   Pulse 86   Temp 97.9 F (36.6 C)   Ht _0  (1.803 m)   Wt (!) 320 lb (145.2 kg)   BMI 44.63 kg/m   Intake/Output from previous day: No intake/output data recorded. Intake/Output this shift: _1 @   Physical Exam Vitals reviewed.  Constitutional:      Appearance: Normal appearance. He is obese.  Cardiovascular:     Rate and Rhythm: Normal rate and regular rhythm.     Heart sounds: Normal heart sounds.  Pulmonary:     Effort: Pulmonary effort is normal. No respiratory distress.     Breath sounds: Normal breath sounds.  Abdominal:     Palpations: Abdomen is soft.     Comments: Obese  Genitourinary:    Comments: Normal phallus with adequate meatus. Scrotum normal. Testes atrophied without mass. Epididymis normal. AP without lesions. NST with internal hemorrhoid but no mass. Prostate 1.5+ benign. SV non-palpable.  Musculoskeletal:        General: No swelling or tenderness. Normal range of motion.     Cervical back: Normal range of motion and neck supple.  Skin:    General: Skin is warm and dry.  Neurological:     General: No focal deficit present.     Mental Status: He is alert and oriented to person, place, and time.  Psychiatric:        Mood and Affect: Mood normal.        Behavior: Behavior normal.    Lab Results:  Recent Results (from the past 2160 hour(s))  Drug Screen 10 W/Conf, Se     Status: None   Collection Time: 02/02/21 11:37 AM  Result Value Ref Range   Amphetamines, IA Negative Cutoff:50 ng/mL    Barbiturates, IA Negative Cutoff:0.1 ug/mL   Benzodiazepines, IA Negative Cutoff:20 ng/mL   Cocaine & Metabolite, IA Negative Cutoff:25 ng/mL  Phencyclidine, IA Negative Cutoff:8 ng/mL   THC(Marijuana) Metabolite, IA Negative Cutoff:5 ng/mL   Opiates, IA Negative Cutoff:5 ng/mL   Oxycodones, IA Negative Cutoff:5 ng/mL   Methadone, IA Negative Cutoff:25 ng/mL   Propoxyphene, IA Negative Cutoff:50 ng/mL    Comment: This test was developed and its performance characteristics determined by [facility].  It has not been cleared or approved by the Food and Drug Administration.   Testosterone,Free and Total     Status: None   Collection Time: 02/08/21  8:19 AM  Result Value Ref Range   Testosterone 687 264 - 916 ng/dL    Comment: Adult male reference interval is based on a population of healthy nonobese males (BMI <30) between 30 and 18 years old. Cornelius, [place] [phone]. PMID: [id number].    Testosterone, Free 16.8 7.2 - 24.0 pg/mL  PSA, total and free     Status: None   Collection Time: 02/08/21  8:19 AM  Result Value Ref Range   Prostate Specific Ag, Serum 0.8 0.0 - 4.0 ng/mL    Comment: Roche ECLIA methodology. According to the American Urological Association, Serum PSA should decrease and remain at undetectable levels after radical prostatectomy. The AUA defines biochemical recurrence as an initial PSA value 0.2 ng/mL or greater followed by a subsequent confirmatory PSA value 0.2 ng/mL or greater. Values obtained with different assay methods or kits cannot be used interchangeably. Results cannot be interpreted as absolute evidence of the presence or absence of malignant disease.    PSA, Free 0.31 N/A ng/mL    Comment: Roche ECLIA methodology.   PSA, Free Pct 38.8 %    Comment: The table below lists the probability of prostate cancer for men with non-suspicious DRE results and total PSA between 4 and 10 ng/mL, by patient age Ricci Barker, [place], [id number]).                    % Free PSA       50-64 yr        65-75 yr                   0.00-10.00%        56%             55%                  10.01-15.00%        24%             35%                  15.01-20.00%        17%             23%                  20.01-25.00%        10%             20%                       >25.00%         5%              9% Please note:  Catalona et al did not make specific               recommendations regarding the use of               percent free  PSA for any other population               of men.   Lipid panel     Status: Abnormal   Collection Time: 02/08/21  8:19 AM  Result Value Ref Range   Cholesterol, Total 172 100 - 199 mg/dL   Triglycerides 138 0 - 149 mg/dL   HDL 34 (L) >39 mg/dL   VLDL Cholesterol Cal 25 5 - 40 mg/dL   LDL Chol Calc (NIH) 113 (H) 0 - 99 mg/dL   Chol/HDL Ratio 5.1 (H) 0.0 - 5.0 ratio    Comment:                                   T. Chol/HDL Ratio                                             Men  Women                               1/2 Avg.Risk  3.4    3.3                                   Avg.Risk  5.0    4.4                                2X Avg.Risk  9.6    7.1                                3X Avg.Risk 23.4   11.0   CMP14+EGFR     Status: None   Collection Time: 02/08/21  8:19 AM  Result Value Ref Range   Glucose 96 65 - 99 mg/dL   BUN 14 6 - 24 mg/dL   Creatinine, Ser 1.11 0.76 - 1.27 mg/dL   eGFR 80 >59 mL/min/1.73   BUN/Creatinine Ratio 13 9 - 20   Sodium 140 134 - 144 mmol/L   Potassium 4.5 3.5 - 5.2 mmol/L   Chloride 101 96 - 106 mmol/L   CO2 23 20 - 29 mmol/L   Calcium 9.6 8.7 - 10.2 mg/dL   Total Protein 7.1 6.0 - 8.5 g/dL   Albumin 4.2 3.8 - 4.9 g/dL   Globulin, Total 2.9 1.5 - 4.5 g/dL   Albumin/Globulin Ratio 1.4 1.2 - 2.2   Bilirubin Total 0.8 0.0 - 1.2 mg/dL   Alkaline Phosphatase 68 44 - 121 IU/L   AST 22 0 - 40 IU/L   ALT 26 0 - 44 IU/L  CBC with Differential/Platelet     Status: Abnormal   Collection Time:  02/08/21  8:19 AM  Result Value Ref Range   WBC 7.3 3.4 - 10.8 x10E3/uL   RBC 5.63 4.14 - 5.80 x10E6/uL   Hemoglobin 17.7 13.0 - 17.7 g/dL   Hematocrit 52.4 (H) 37.5 - 51.0 %   MCV 93 79 - 97 fL   MCH 31.4 26.6 - 33.0 pg   MCHC 33.8 31.5 - 35.7 g/dL   RDW 13.0 11.6 - 15.4 %  Platelets 227 150 - 450 x10E3/uL   Neutrophils 59 Not Estab. %   Lymphs 22 Not Estab. %   Monocytes 17 Not Estab. %   Eos 2 Not Estab. %   Basos 0 Not Estab. %   Neutrophils Absolute 4.3 1.4 - 7.0 x10E3/uL   Lymphocytes Absolute 1.6 0.7 - 3.1 x10E3/uL   Monocytes Absolute 1.2 (H) 0.1 - 0.9 x10E3/uL   EOS (ABSOLUTE) 0.1 0.0 - 0.4 x10E3/uL   Basophils Absolute 0.0 0.0 - 0.2 x10E3/uL   Immature Granulocytes 0 Not Estab. %   Immature Grans (Abs) 0.0 0.0 - 0.1 x10E3/uL     BMET No results for input(s): NA, K, CL, CO2, GLUCOSE, BUN, CREATININE, CALCIUM in the last 72 hours. PT/INR No results for input(s): LABPROT, INR in the last 72 hours. ABG No results for input(s): PHART, HCO3 in the last 72 hours.  Invalid input(s): PCO2, PO2  Studies/Results: No results found.   Assessment/Plan: Hypogonadism.  He has improved energy on the current therapy.  He has had some increased irritability but that is more recent.   I will change him to 167m weekly to try to even out the level.  Polycythemia.  His Hgb is up and he may need to donate blood.  ED.  I will have him take tadalafil 527mdaily and then have him use sildenafil 5019ms needed.  BPH with urgency.   I will add the tadalafil as noted.   Morbid obesity.   I explained the impact of obesity on many of his conditions and the importance of weight loss.   Meds ordered this encounter  Medications   tadalafil (CIALIS) 5 MG tablet    Sig: Take 1 tablet (5 mg total) by mouth daily as needed for erectile dysfunction.    Dispense:  30 tablet    Refill:  11      Orders Placed This Encounter  Procedures   Urinalysis, Routine w reflex microscopic    Hemoglobin and hematocrit, blood    Standing Status:   Future    Standing Expiration Date:   10/05/2021   Testosterone    Standing Status:   Future    Standing Expiration Date:   10/05/2021     Return in about 3 months (around 07/07/2021) for with labs. .    CC: BriHendricks LimesP     JohIrine Seal/11/2020 [phone]

## 2021-04-06 NOTE — Progress Notes (Signed)
Urological Symptom Review  Patient is experiencing the following symptoms: Hard to postpone urination Burning/pain with urination Get up at night to urinate Have to strain to urinate Painful intercourse Erection problems (male only) Penile pain (male only)    Review of Systems  Gastrointestinal (upper)  : Indigestion/heartburn  Gastrointestinal (lower) : Negative for lower GI symptoms  Constitutional : Night Sweats  Skin: Negative for skin symptoms  Eyes: Negative for eye symptoms  Ear/Nose/Throat : Negative for Ear/Nose/Throat symptoms  Hematologic/Lymphatic: Easy bruising  Cardiovascular : Negative for cardiovascular symptoms  Respiratory : Shortness of breath  Endocrine: Excessive thirst  Musculoskeletal: Negative for musculoskeletal symptoms  Neurological: Negative for neurological symptoms  Psychologic: Negative for psychiatric symptoms

## 2021-04-18 ENCOUNTER — Ambulatory Visit: Payer: 59 | Admitting: Family Medicine

## 2021-04-18 ENCOUNTER — Encounter: Payer: Self-pay | Admitting: Family Medicine

## 2021-04-18 ENCOUNTER — Other Ambulatory Visit: Payer: Self-pay

## 2021-04-18 VITALS — BP 157/101 | HR 88 | Temp 98.3°F | Ht 71.0 in | Wt 320.8 lb

## 2021-04-18 DIAGNOSIS — Z23 Encounter for immunization: Secondary | ICD-10-CM | POA: Diagnosis not present

## 2021-04-18 DIAGNOSIS — J449 Chronic obstructive pulmonary disease, unspecified: Secondary | ICD-10-CM

## 2021-04-18 DIAGNOSIS — K429 Umbilical hernia without obstruction or gangrene: Secondary | ICD-10-CM

## 2021-04-18 DIAGNOSIS — Z79899 Other long term (current) drug therapy: Secondary | ICD-10-CM

## 2021-04-18 DIAGNOSIS — R0981 Nasal congestion: Secondary | ICD-10-CM

## 2021-04-18 DIAGNOSIS — J302 Other seasonal allergic rhinitis: Secondary | ICD-10-CM

## 2021-04-18 DIAGNOSIS — G250 Essential tremor: Secondary | ICD-10-CM

## 2021-04-18 DIAGNOSIS — K047 Periapical abscess without sinus: Secondary | ICD-10-CM

## 2021-04-18 DIAGNOSIS — L309 Dermatitis, unspecified: Secondary | ICD-10-CM

## 2021-04-18 DIAGNOSIS — R600 Localized edema: Secondary | ICD-10-CM | POA: Diagnosis not present

## 2021-04-18 DIAGNOSIS — R053 Chronic cough: Secondary | ICD-10-CM

## 2021-04-18 DIAGNOSIS — R454 Irritability and anger: Secondary | ICD-10-CM

## 2021-04-18 DIAGNOSIS — I10 Essential (primary) hypertension: Secondary | ICD-10-CM | POA: Diagnosis not present

## 2021-04-18 DIAGNOSIS — R7989 Other specified abnormal findings of blood chemistry: Secondary | ICD-10-CM

## 2021-04-18 MED ORDER — AMOXICILLIN-POT CLAVULANATE 875-125 MG PO TABS: 1.0000 | ORAL_TABLET | Freq: Two times a day (BID) | ORAL | 0 refills | Status: AC

## 2021-04-18 MED ORDER — GABAPENTIN 100 MG PO CAPS: 100.0000 mg | ORAL_CAPSULE | Freq: Three times a day (TID) | ORAL | 2 refills | Status: DC

## 2021-04-18 MED ORDER — LEVOCETIRIZINE DIHYDROCHLORIDE 5 MG PO TABS: 5.0000 mg | ORAL_TABLET | Freq: Every evening | ORAL | 1 refills | Status: DC

## 2021-04-18 MED ORDER — HYDROCHLOROTHIAZIDE 50 MG PO TABS: 50.0000 mg | ORAL_TABLET | Freq: Every day | ORAL | 1 refills | Status: DC

## 2021-04-18 MED ORDER — TRIAMCINOLONE ACETONIDE 0.1 % EX CREA: 1.0000 "application " | TOPICAL_CREAM | Freq: Two times a day (BID) | CUTANEOUS | 1 refills | Status: DC

## 2021-04-18 MED ORDER — TESTOSTERONE CYPIONATE 200 MG/ML IM SOLN: 100.0000 mg | INTRAMUSCULAR | 1 refills | Status: DC

## 2021-04-18 MED ORDER — FLUOXETINE HCL 20 MG PO CAPS: 20.0000 mg | ORAL_CAPSULE | Freq: Every day | ORAL | 2 refills | Status: DC

## 2021-04-18 NOTE — Progress Notes (Signed)
Assessment & Plan:  1. COPD without exacerbation ([place]) Well controlled on current regimen.  - albuterol (VENTOLIN HFA) 108 (90 Base) MCG/ACT inhaler; Inhale 2 puffs into the lungs every 6 (six) hours as needed for wheezing or shortness of breath. - fluticasone-salmeterol (ADVAIR) 250-50 MCG/ACT AEPB; Inhale 1 puff into the lungs in the morning and at bedtime.  2. Edema of both lower extremities Well controlled on current regimen.   3. Essential hypertension Elevated today, but patient feels it is due to pain. Will reassess. - hydrochlorothiazide (HYDRODIURIL) 50 MG tablet; Take 1 tablet (50 mg total) by mouth daily.  Dispense: 90 tablet; Refill: 1  4. Feeling angry Uncontrolled. Decrease Wellbutrin to every other day x2 weeks, then stop. Starting Prozac now while weaning Wellbutrin. Discussed risks of bleeding when taking SSRI and NSAID together; patient understands and is willing to accept this risk. - FLUoxetine (PROZAC) 20 MG capsule; Take 1 capsule (20 mg total) by mouth daily.  Dispense: 30 capsule; Refill: 2  5-6. Low testosterone in male/Controlled substance agreement signed Currently being managed by urology, but we do have a controlled substance agreement and drug screen on file for this. - testosterone cypionate (DEPOTESTOSTERONE CYPIONATE) 200 MG/ML injection; Inject 0.5 mLs (100 mg total) into the muscle once a week.  Dispense: 10 mL; Refill: 1  7. Essential tremor Uncontrolled. Starting gabapentin, which may also help with RLS.  - gabapentin (NEURONTIN) 100 MG capsule; Take 1 capsule (100 mg total) by mouth 3 (three) times daily.  Dispense: 90 capsule; Refill: 2  8. Dermatitis - triamcinolone cream (KENALOG) 0.1 %; Apply 1 application topically 2 (two) times daily.  Dispense: 80 g; Refill: 1  9. Dental abscess Encouraged to look for a dentist. Discussed dental schools as well. - amoxicillin-clavulanate (AUGMENTIN) 875-125 MG tablet; Take 1 tablet by mouth 2 (two) times  daily for 7 days.  Dispense: 14 tablet; Refill: 0  10. Chronic cough Patient to pay attention to cough and triggers.  11-12. Nasal congestion/Seasonal allergies Continue antihistamine. Add nasal spray. - levocetirizine (XYZAL) 5 MG tablet; Take 1 tablet (5 mg total) by mouth every evening.  Dispense: 90 tablet; Refill: 1  13. Umbilical hernia without obstruction and without gangrene - Ambulatory referral to General Surgery  14. Need for immunization against influenza - Flu Vaccine QUAD 7moIM (Fluarix, Fluzone & Alfiuria Quad PF)   Return in about 6 weeks (around 05/30/2021) for follow-up of chronic medication conditions (30 minute appt slot).  BHendricks Limes MSN, APRN, FNP-C [facility]  Subjective:    Patient ID: [id number] Castillo male    DOB: [birth year] 55y.o.   MRN: [id number] Patient Care Team: JLoman Brooklyn FNP as PCP - General (Family Medicine)   Chief Complaint:  Chief Complaint  Patient presents with   COPD   Hypertension    Check up of chronic medical conditions    mood    Patient states that the wellbutrin is not helping with his mood and temper.  Would like it changed.    Tremors    Getting worse    HPI: GCAM DAUPHINis a 55y.o. male presenting on 04/18/2021 for COPD, Hypertension (Check up of chronic medical conditions ), mood (Patient states that the wellbutrin is not helping with his mood and temper.  Would like it changed. ), and Tremors (Getting worse)  COPD Patient is using Advair daily as previously prescribed. He is now not needing the Albuterol inhaler at all.  Edema Taking HCTZ.   Hypertension Blood pressure elevated today. He believes it is due to pain from an abscessed tooth on the top right side. The tooth is broke in half and he does not have dental insurance.   Low Testosterone/Erectile Dysfunction Patient saw urology on 04/06/2021 at which time his testosterone was changed from 200 mg every other week to 100  mg weekly to try to even out his levels. He was also prescribed tadalafil 5 mg to take daily for erectile dysfunction and BPH, while continuing sildenafil 50 mg as needed for erectile dysfunction. During that visit it was recommended patient donate blood due to polycythemia.   Feeling Angry He was started on Wellbutrin at our last visit to try to help with his mood. He states it does not help at all. He mentions having a hard time dealing with his wife. He does have a therapist, who he reports recommended he switch his medication.   Tremor Patient reports his tremor is getting worse. He previously took propranolol, which according to notes was improving the tremor. However, patient reports today he did not feel like the medication was helping.   New complaints: Patient reports nasal congestion that only occurs when he is at home. He is questioning if he is allergic to something in his house, such as the cats. He has a chronic cough that occurs throughout the day, it is not limited to when he is at home. He is unsure if the cough changed at all after adding Advair.    Social history:  Relevant past medical, surgical, family and social history reviewed and updated as indicated. Interim medical history since our last visit reviewed.  Allergies and medications reviewed and updated.  DATA REVIEWED: CHART IN EPIC  ROS: Negative unless specifically indicated above in HPI.    Current Outpatient Medications:    aspirin EC 81 MG tablet, Take 81 mg by mouth daily., Disp: , Rfl:    buPROPion (WELLBUTRIN XL) 150 MG 24 hr tablet, Take 1 tablet (150 mg total) by mouth daily., Disp: 30 tablet, Rfl: 2   diclofenac (VOLTAREN) 75 MG EC tablet, TAKE ONE TABLET (75MG TOTAL) BY MOUTH TWO TIMES DAILY, Disp: 180 tablet, Rfl: 1   famotidine (PEPCID) 20 MG tablet, Take 3 tablets (60 mg total) by mouth at bedtime., Disp: 270 tablet, Rfl: 1   hydrochlorothiazide (HYDRODIURIL) 50 MG tablet, Take 1 tablet (50 mg  total) by mouth daily., Disp: 30 tablet, Rfl: 2   levocetirizine (XYZAL) 5 MG tablet, Take 1 tablet (5 mg total) by mouth every evening., Disp: 30 tablet, Rfl: 2   Misc Natural Products (URINOZINC PLUS PO), Take by mouth., Disp: , Rfl:    Multiple Vitamins-Minerals (MULTIVITAMIN MEN PO), Take by mouth., Disp: , Rfl:    NEEDLE, DISP, 23 G (BD DISP NEEDLE) 23G X 1" MISC, Use every 14 days with testosterone, Disp: 50 each, Rfl: 1   pantoprazole (PROTONIX) 40 MG tablet, TAKE ONE TABLET (40MG TOTAL) BY MOUTH DAILY, Disp: 90 tablet, Rfl: 1   Probiotic Product (HEALTHY COLON PO), Take by mouth., Disp: , Rfl:    rOPINIRole (REQUIP) 4 MG tablet, Take 1 tablet (4 mg total) by mouth at bedtime., Disp: 90 tablet, Rfl: 1   sildenafil (VIAGRA) 100 MG tablet, Take 100 mg by mouth daily as needed., Disp: , Rfl:    Specialty Vitamins Products (ECHINACEA C COMPLETE PO), Take by mouth., Disp: , Rfl:    Syringe/Needle, Disp, 18G X 1" 3 ML  MISC, 1 Device by Does not apply route every 14 (fourteen) days., Disp: 12 each, Rfl: 1   tadalafil (CIALIS) 5 MG tablet, Take 1 tablet (5 mg total) by mouth daily as needed for erectile dysfunction., Disp: 30 tablet, Rfl: 11   testosterone cypionate (DEPOTESTOSTERONE CYPIONATE) 200 MG/ML injection, INJECT 1ML (200MG TOTAL) INTO THE MUSCLE EVERY 14 DAYS, Disp: 10 mL, Rfl: 2   Allergies  Allergen Reactions   Mobic [Meloxicam] Other (See Comments)    Mouth sores   Past Medical History:  Diagnosis Date   Enlarged prostate    Neuropathy    RLE   Restless leg    Sleep apnea     Past Surgical History:  Procedure Laterality Date   HERNIA REPAIR      Social History   Socioeconomic History   Marital status: Married    Spouse name: Not on file   Number of children: Not on file   Years of education: Not on file   Highest education level: Not on file  Occupational History   Not on file  Tobacco Use   Smoking status: Former    Packs/day: 3.00    Years: 18.00    Pack  years: 54.00    Types: Cigarettes    Quit date: 07/03/2007    Years since quitting: 13.8   Smokeless tobacco: Never  Vaping Use   Vaping Use: Never used  Substance and Sexual Activity   Alcohol use: Never   Drug use: Never   Sexual activity: Not on file  Other Topics Concern   Not on file  Social History Narrative   Not on file   Social Determinants of Health   Financial Resource Strain: Not on file  Food Insecurity: Not on file  Transportation Needs: Not on file  Physical Activity: Not on file  Stress: Not on file  Social Connections: Not on file  Intimate Partner Violence: Not on file        Objective:    BP (!) 157/101   Pulse 88   Temp 98.3 F (36.8 C) (Temporal)   Ht 5' 11"  (1.803 m)   Wt (!) 320 lb 12.8 oz (145.5 kg)   SpO2 98%   BMI 44.74 kg/m   Wt Readings from Last 3 Encounters:  04/18/21 (!) 320 lb 12.8 oz (145.5 kg)  04/06/21 (!) 320 lb (145.2 kg)  02/20/21 (!) 315 lb (142.9 kg)    Physical Exam Vitals reviewed.  Constitutional:      General: He is not in acute distress.    Appearance: Normal appearance. He is morbidly obese. He is not ill-appearing, toxic-appearing or diaphoretic.  HENT:     Head: Normocephalic and atraumatic.  Eyes:     General: No scleral icterus.       Right eye: No discharge.        Left eye: No discharge.     Conjunctiva/sclera: Conjunctivae normal.  Cardiovascular:     Rate and Rhythm: Normal rate and regular rhythm.     Heart sounds: Normal heart sounds. No murmur heard.   No friction rub. No gallop.  Pulmonary:     Effort: Pulmonary effort is normal. No respiratory distress.     Breath sounds: Normal breath sounds. No stridor. No wheezing, rhonchi or rales.  Musculoskeletal:        General: Normal range of motion.     Cervical back: Normal range of motion.  Skin:    General: Skin is warm and dry.  Neurological:     Mental Status: He is alert and oriented to person, place, and time. Mental status is at baseline.   Psychiatric:        Mood and Affect: Mood normal.        Behavior: Behavior normal.        Thought Content: Thought content normal.        Judgment: Judgment normal.    Lab Results  Component Value Date   TSH 3.370 09/16/2019   Lab Results  Component Value Date   WBC 7.3 02/08/2021   HGB 17.7 02/08/2021   HCT 52.4 (H) 02/08/2021   MCV 93 02/08/2021   PLT 227 02/08/2021   Lab Results  Component Value Date   NA 140 02/08/2021   K 4.5 02/08/2021   CO2 23 02/08/2021   GLUCOSE 96 02/08/2021   BUN 14 02/08/2021   CREATININE 1.11 02/08/2021   BILITOT 0.8 02/08/2021   ALKPHOS 68 02/08/2021   AST 22 02/08/2021   ALT 26 02/08/2021   PROT 7.1 02/08/2021   ALBUMIN 4.2 02/08/2021   CALCIUM 9.6 02/08/2021   EGFR 80 02/08/2021   Lab Results  Component Value Date   CHOL 172 02/08/2021   Lab Results  Component Value Date   HDL 34 (L) 02/08/2021   Lab Results  Component Value Date   LDLCALC 113 (H) 02/08/2021   Lab Results  Component Value Date   TRIG 138 02/08/2021   Lab Results  Component Value Date   CHOLHDL 5.1 (H) 02/08/2021   No results found for: HGBA1C

## 2021-04-18 NOTE — Patient Instructions (Signed)
Decrease bupropion (Wellbutrin) to every other day x2 weeks, then stop. You can go ahead and start Prozac while you are weaning off bupropion (Wellbutrin).  We are not doing your shingles vaccine today as [facility] does not pay for this.

## 2021-04-24 ENCOUNTER — Encounter: Payer: Self-pay | Admitting: Family Medicine

## 2021-04-24 DIAGNOSIS — K429 Umbilical hernia without obstruction or gangrene: Secondary | ICD-10-CM | POA: Insufficient documentation

## 2021-04-24 DIAGNOSIS — J302 Other seasonal allergic rhinitis: Secondary | ICD-10-CM | POA: Insufficient documentation

## 2021-04-24 DIAGNOSIS — G250 Essential tremor: Secondary | ICD-10-CM | POA: Insufficient documentation

## 2021-04-25 ENCOUNTER — Ambulatory Visit: Payer: 59 | Admitting: Pulmonary Disease

## 2021-06-07 ENCOUNTER — Ambulatory Visit: Payer: 59 | Admitting: Family Medicine

## 2021-06-07 ENCOUNTER — Other Ambulatory Visit: Payer: Self-pay

## 2021-06-07 ENCOUNTER — Encounter: Payer: Self-pay | Admitting: Family Medicine

## 2021-06-07 VITALS — BP 143/92 | HR 81 | Temp 98.4°F | Ht 71.0 in | Wt 325.4 lb

## 2021-06-07 DIAGNOSIS — I10 Essential (primary) hypertension: Secondary | ICD-10-CM

## 2021-06-07 DIAGNOSIS — R600 Localized edema: Secondary | ICD-10-CM | POA: Diagnosis not present

## 2021-06-07 DIAGNOSIS — F5232 Male orgasmic disorder: Secondary | ICD-10-CM

## 2021-06-07 DIAGNOSIS — G250 Essential tremor: Secondary | ICD-10-CM

## 2021-06-07 DIAGNOSIS — J449 Chronic obstructive pulmonary disease, unspecified: Secondary | ICD-10-CM

## 2021-06-07 DIAGNOSIS — R7989 Other specified abnormal findings of blood chemistry: Secondary | ICD-10-CM | POA: Diagnosis not present

## 2021-06-07 DIAGNOSIS — J302 Other seasonal allergic rhinitis: Secondary | ICD-10-CM

## 2021-06-07 DIAGNOSIS — R454 Irritability and anger: Secondary | ICD-10-CM

## 2021-06-07 DIAGNOSIS — Z79899 Other long term (current) drug therapy: Secondary | ICD-10-CM

## 2021-06-07 DIAGNOSIS — M26609 Unspecified temporomandibular joint disorder, unspecified side: Secondary | ICD-10-CM

## 2021-06-07 DIAGNOSIS — G629 Polyneuropathy, unspecified: Secondary | ICD-10-CM

## 2021-06-07 MED ORDER — AMLODIPINE BESYLATE 5 MG PO TABS: 5.0000 mg | ORAL_TABLET | Freq: Every day | ORAL | 2 refills | Status: DC

## 2021-06-07 MED ORDER — TESTOSTERONE CYPIONATE 200 MG/ML IM SOLN: 100.0000 mg | INTRAMUSCULAR | 1 refills | Status: DC

## 2021-06-07 MED ORDER — FLUTICASONE PROPIONATE 50 MCG/ACT NA SUSP: 2.0000 | Freq: Every day | NASAL | 6 refills | Status: DC

## 2021-06-07 MED ORDER — METHOCARBAMOL 500 MG PO TABS: 500.0000 mg | ORAL_TABLET | Freq: Three times a day (TID) | ORAL | 2 refills | Status: DC | PRN

## 2021-06-07 NOTE — Progress Notes (Signed)
Assessment & Plan:  1. Essential hypertension Uncontrolled, started on amlodipine 5 mg daily.  Continue hydrochlorothiazide 50 mg once daily. - amLODipine (NORVASC) 5 MG tablet; Take 1 tablet (5 mg total) by mouth daily.  Dispense: 30 tablet; Refill: 2  2. COPD without exacerbation ([facility]) Well controlled on current regimen.   3. Edema of both lower extremities Well controlled on current regimen.   4. Low testosterone in male Managed by urology; keep appointment scheduled for January. - testosterone cypionate (DEPOTESTOSTERONE CYPIONATE) 200 MG/ML injection; Inject 0.5 mLs (100 mg total) into the muscle once a week.  Dispense: 10 mL; Refill: 1  5. Controlled substance agreement signed - testosterone cypionate (DEPOTESTOSTERONE CYPIONATE) 200 MG/ML injection; Inject 0.5 mLs (100 mg total) into the muscle once a week.  Dispense: 10 mL; Refill: 1  6. Feeling angry Well controlled on current regimen.   7. Essential tremor Uncontrolled.  Failed treatment with gabapentin and propranolol. - Ambulatory referral to Neurology  8. Seasonal allergies Well controlled on current regimen.  - fluticasone (FLONASE) 50 MCG/ACT nasal spray; Place 2 sprays into both nostrils daily.  Dispense: 16 g; Refill: 6  9. 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  If after that appointment he desires for me to change his Prozac to something else he will send me a MyChart message.  10. Neuropathy Well controlled on current regimen.   11. TMJ (temporomandibular joint syndrome) Education provided on TMJ. - methocarbamol (ROBAXIN) 500 MG tablet; Take 1 tablet (500 mg total) by mouth every 8 (eight) hours as needed for muscle spasms.  Dispense: 60 tablet; Refill: 2   Return in about 3 months (around 09/05/2021) for annual physical.  Hendricks Limes, MSN, APRN, FNP-C [facility]  Subjective:    Patient ID: Jesse Castillo, male    DOB: 02/03/1966,  55 y.o.   MRN: [id number]  Patient Care Team: Loman Brooklyn, FNP as PCP - General (Family Medicine)   Chief Complaint:  Chief Complaint  Patient presents with   Hypertension   COPD    6 week chronic medical conditions    trimmers    Left hand no better    Personal Problem    HPI: Jesse Castillo is a 55 y.o. male presenting on 06/07/2021 for Hypertension, COPD (6 week chronic medical conditions ), trimmers (Left hand no better ), and Personal Problem  Patient has his wife on a video call for this visit since she could not be present today.  COPD Patient is using Advair daily as previously prescribed. He is now not needing the Albuterol inhaler at all.   Edema Taking HCTZ.   Hypertension Blood pressure elevated previously, which patient felt was due to pain, but he is elevated again today.  He does not check his blood pressure at home.  No exercise.  He does add salt to his foods.  Low Testosterone/Erectile Dysfunction Patient saw urology on 04/06/2021 at which time his testosterone was changed from 200 mg every other week to 100 mg weekly to try to even out his levels. He was also prescribed tadalafil 5 mg to take daily for erectile dysfunction and BPH, while continuing sildenafil 50 mg as needed for erectile dysfunction. During that visit it was recommended patient donate blood due to polycythemia.  He reports he has not found any blood drops to participate in recently.  He is not interested in a referral to hematology for therapeutic phlebotomies.  Feeling  Angry He was started on Prozac 20 mg daily at our last visit.  Previously failed treatment with Wellbutrin.  He reports today he is feeling much better with the Prozac.  Tremor At our last visit patient was started on gabapentin, which he does not feel has been helpful for his tremor.  He previously took propranolol, which according to notes was improving the tremor. However, patient reported he did not feel like the medication  was helping.   New complaints: Patient reports he is unable to "release" during sexual intercourse due to reduced sensation in the head of his penis.  This has been an ongoing problem, but has been worse over the past 6 weeks since his testosterone injections were changed and he started taking Prozac.  He does have an appointment with his urologist next month.  Patient reports he is not as bothered by this as his wife.  Patient reports the gabapentin he was started on for his tremor does help the nerve pain in his right foot.  Patient reports left jaw pain that radiates to his ear for the past couple of weeks.  Occurs more often when chewing.   Social history:  Relevant past medical, surgical, family and social history reviewed and updated as indicated. Interim medical history since our last visit reviewed.  Allergies and medications reviewed and updated.  DATA REVIEWED: CHART IN EPIC  ROS: Negative unless specifically indicated above in HPI.    Current Outpatient Medications:    albuterol (VENTOLIN HFA) 108 (90 Base) MCG/ACT inhaler, Inhale 2 puffs into the lungs every 6 (six) hours as needed for wheezing or shortness of breath., Disp: , Rfl:    aspirin EC 81 MG tablet, Take 81 mg by mouth daily., Disp: , Rfl:    diclofenac (VOLTAREN) 75 MG EC tablet, TAKE ONE TABLET (75MG TOTAL) BY MOUTH TWO TIMES DAILY, Disp: 180 tablet, Rfl: 1   famotidine (PEPCID) 20 MG tablet, Take 3 tablets (60 mg total) by mouth at bedtime., Disp: 270 tablet, Rfl: 1   FLUoxetine (PROZAC) 20 MG capsule, Take 1 capsule (20 mg total) by mouth daily., Disp: 30 capsule, Rfl: 2   fluticasone-salmeterol (ADVAIR) 250-50 MCG/ACT AEPB, Inhale 1 puff into the lungs in the morning and at bedtime., Disp: , Rfl:    gabapentin (NEURONTIN) 100 MG capsule, Take 1 capsule (100 mg total) by mouth 3 (three) times daily., Disp: 90 capsule, Rfl: 2   hydrochlorothiazide (HYDRODIURIL) 50 MG tablet, Take 1 tablet (50 mg total) by mouth  daily., Disp: 90 tablet, Rfl: 1   levocetirizine (XYZAL) 5 MG tablet, Take 1 tablet (5 mg total) by mouth every evening., Disp: 90 tablet, Rfl: 1   Misc Natural Products (URINOZINC PLUS PO), Take by mouth., Disp: , Rfl:    Multiple Vitamins-Minerals (MULTIVITAMIN MEN PO), Take by mouth., Disp: , Rfl:    NEEDLE, DISP, 23 G (BD DISP NEEDLE) 23G X 1" MISC, Use every 14 days with testosterone, Disp: 50 each, Rfl: 1   pantoprazole (PROTONIX) 40 MG tablet, TAKE ONE TABLET (40MG TOTAL) BY MOUTH DAILY, Disp: 90 tablet, Rfl: 1   Probiotic Product (HEALTHY COLON PO), Take by mouth., Disp: , Rfl:    rOPINIRole (REQUIP) 4 MG tablet, Take 1 tablet (4 mg total) by mouth at bedtime., Disp: 90 tablet, Rfl: 1   sildenafil (VIAGRA) 100 MG tablet, Take 100 mg by mouth daily as needed., Disp: , Rfl:    Specialty Vitamins Products (ECHINACEA C COMPLETE PO), Take by mouth., Disp: ,  Rfl:    Syringe/Needle, Disp, 18G X 1" 3 ML MISC, 1 Device by Does not apply route every 14 (fourteen) days., Disp: 12 each, Rfl: 1   tadalafil (CIALIS) 5 MG tablet, Take 1 tablet (5 mg total) by mouth daily as needed for erectile dysfunction., Disp: 30 tablet, Rfl: 11   testosterone cypionate (DEPOTESTOSTERONE CYPIONATE) 200 MG/ML injection, Inject 0.5 mLs (100 mg total) into the muscle once a week., Disp: 10 mL, Rfl: 1   triamcinolone cream (KENALOG) 0.1 %, Apply 1 application topically 2 (two) times daily., Disp: 80 g, Rfl: 1   Allergies  Allergen Reactions   Mobic [Meloxicam] Other (See Comments)    Mouth sores   Past Medical History:  Diagnosis Date   Enlarged prostate    Neuropathy    RLE   Restless leg    Sleep apnea     Past Surgical History:  Procedure Laterality Date   HERNIA REPAIR      Social History   Socioeconomic History   Marital status: Married    Spouse name: Not on file   Number of children: Not on file   Years of education: Not on file   Highest education level: Not on file  Occupational History    Not on file  Tobacco Use   Smoking status: Former    Packs/day: 3.00    Years: 18.00    Pack years: 54.00    Types: Cigarettes    Quit date: 07/03/2007    Years since quitting: 13.9   Smokeless tobacco: Never  Vaping Use   Vaping Use: Never used  Substance and Sexual Activity   Alcohol use: Never   Drug use: Never   Sexual activity: Not on file  Other Topics Concern   Not on file  Social History Narrative   Not on file   Social Determinants of Health   Financial Resource Strain: Not on file  Food Insecurity: Not on file  Transportation Needs: Not on file  Physical Activity: Not on file  Stress: Not on file  Social Connections: Not on file  Intimate Partner Violence: Not on file        Objective:    BP (!) 143/92   Pulse 81   Temp 98.4 F (36.9 C) (Temporal)   Ht 5' 11"  (1.803 m)   Wt (!) 325 lb 6.4 oz (147.6 kg)   SpO2 95%   BMI 45.38 kg/m   Wt Readings from Last 3 Encounters:  06/07/21 (!) 325 lb 6.4 oz (147.6 kg)  04/18/21 (!) 320 lb 12.8 oz (145.5 kg)  04/06/21 (!) 320 lb (145.2 kg)    Physical Exam Vitals reviewed.  Constitutional:      General: He is not in acute distress.    Appearance: Normal appearance. He is morbidly obese. He is not ill-appearing, toxic-appearing or diaphoretic.  HENT:     Head: Normocephalic and atraumatic.  Eyes:     General: No scleral icterus.       Right eye: No discharge.        Left eye: No discharge.     Conjunctiva/sclera: Conjunctivae normal.  Cardiovascular:     Rate and Rhythm: Normal rate and regular rhythm.     Heart sounds: Normal heart sounds. No murmur heard.   No friction rub. No gallop.  Pulmonary:     Effort: Pulmonary effort is normal. No respiratory distress.     Breath sounds: Normal breath sounds. No stridor. No wheezing, rhonchi or rales.  Musculoskeletal:        General: Normal range of motion.     Cervical back: Normal range of motion.  Skin:    General: Skin is warm and dry.  Neurological:      Mental Status: He is alert and oriented to person, place, and time. Mental status is at baseline.  Psychiatric:        Mood and Affect: Mood normal.        Behavior: Behavior normal.        Thought Content: Thought content normal.        Judgment: Judgment normal.    Lab Results  Component Value Date   TSH 3.370 09/16/2019   Lab Results  Component Value Date   WBC 7.3 02/08/2021   HGB 17.7 02/08/2021   HCT 52.4 (H) 02/08/2021   MCV 93 02/08/2021   PLT 227 02/08/2021   Lab Results  Component Value Date   NA 140 02/08/2021   K 4.5 02/08/2021   CO2 23 02/08/2021   GLUCOSE 96 02/08/2021   BUN 14 02/08/2021   CREATININE 1.11 02/08/2021   BILITOT 0.8 02/08/2021   ALKPHOS 68 02/08/2021   AST 22 02/08/2021   ALT 26 02/08/2021   PROT 7.1 02/08/2021   ALBUMIN 4.2 02/08/2021   CALCIUM 9.6 02/08/2021   EGFR 80 02/08/2021   Lab Results  Component Value Date   CHOL 172 02/08/2021   Lab Results  Component Value Date   HDL 34 (L) 02/08/2021   Lab Results  Component Value Date   LDLCALC 113 (H) 02/08/2021   Lab Results  Component Value Date   TRIG 138 02/08/2021   Lab Results  Component Value Date   CHOLHDL 5.1 (H) 02/08/2021   No results found for: HGBA1C

## 2021-06-08 ENCOUNTER — Other Ambulatory Visit: Payer: Self-pay | Admitting: Family Medicine

## 2021-06-08 DIAGNOSIS — N529 Male erectile dysfunction, unspecified: Secondary | ICD-10-CM

## 2021-06-14 ENCOUNTER — Other Ambulatory Visit: Payer: Self-pay | Admitting: Family Medicine

## 2021-06-14 DIAGNOSIS — N529 Male erectile dysfunction, unspecified: Secondary | ICD-10-CM

## 2021-06-22 ENCOUNTER — Encounter: Payer: Self-pay | Admitting: Family Medicine

## 2021-06-22 DIAGNOSIS — N529 Male erectile dysfunction, unspecified: Secondary | ICD-10-CM

## 2021-06-22 MED ORDER — SILDENAFIL CITRATE 100 MG PO TABS: 100.0000 mg | ORAL_TABLET | Freq: Every day | ORAL | 2 refills | Status: DC | PRN

## 2021-06-28 MED ORDER — SILDENAFIL CITRATE 100 MG PO TABS: 100.0000 mg | ORAL_TABLET | Freq: Every day | ORAL | 2 refills | Status: DC | PRN

## 2021-06-28 NOTE — Addendum Note (Signed)
Addended by: Austin Miles F on: 06/28/2021 10:30 AM   Modules accepted: Orders

## 2021-07-11 ENCOUNTER — Other Ambulatory Visit: Payer: Self-pay

## 2021-07-11 ENCOUNTER — Other Ambulatory Visit: Payer: 59

## 2021-07-11 DIAGNOSIS — E291 Testicular hypofunction: Secondary | ICD-10-CM | POA: Diagnosis not present

## 2021-07-11 DIAGNOSIS — D751 Secondary polycythemia: Secondary | ICD-10-CM

## 2021-07-12 LAB — TESTOSTERONE: Testosterone: 589 ng/dL (ref 264–916)

## 2021-07-12 LAB — HEMOGLOBIN AND HEMATOCRIT, BLOOD
Hematocrit: 51.3 % — ABNORMAL HIGH (ref 37.5–51.0)
Hemoglobin: 17.9 g/dL — ABNORMAL HIGH (ref 13.0–17.7)

## 2021-07-13 ENCOUNTER — Encounter: Payer: Self-pay | Admitting: Family Medicine

## 2021-07-13 ENCOUNTER — Ambulatory Visit (INDEPENDENT_AMBULATORY_CARE_PROVIDER_SITE_OTHER): Payer: 59 | Admitting: Urology

## 2021-07-13 ENCOUNTER — Encounter: Payer: Self-pay | Admitting: Urology

## 2021-07-13 ENCOUNTER — Other Ambulatory Visit: Payer: Self-pay

## 2021-07-13 VITALS — BP 147/89 | HR 76 | Ht 71.0 in | Wt 325.0 lb

## 2021-07-13 DIAGNOSIS — N401 Enlarged prostate with lower urinary tract symptoms: Secondary | ICD-10-CM

## 2021-07-13 DIAGNOSIS — N138 Other obstructive and reflux uropathy: Secondary | ICD-10-CM | POA: Diagnosis not present

## 2021-07-13 DIAGNOSIS — R454 Irritability and anger: Secondary | ICD-10-CM

## 2021-07-13 DIAGNOSIS — D751 Secondary polycythemia: Secondary | ICD-10-CM | POA: Diagnosis not present

## 2021-07-13 DIAGNOSIS — N5201 Erectile dysfunction due to arterial insufficiency: Secondary | ICD-10-CM | POA: Diagnosis not present

## 2021-07-13 DIAGNOSIS — E291 Testicular hypofunction: Secondary | ICD-10-CM

## 2021-07-13 DIAGNOSIS — R3915 Urgency of urination: Secondary | ICD-10-CM

## 2021-07-13 LAB — URINALYSIS, ROUTINE W REFLEX MICROSCOPIC
Bilirubin, UA: NEGATIVE
Glucose, UA: NEGATIVE
Ketones, UA: NEGATIVE
Nitrite, UA: NEGATIVE
Protein,UA: NEGATIVE
Specific Gravity, UA: 1.025 (ref 1.005–1.030)
Urobilinogen, Ur: 0.2 mg/dL (ref 0.2–1.0)
pH, UA: 6 (ref 5.0–7.5)

## 2021-07-13 LAB — MICROSCOPIC EXAMINATION
Bacteria, UA: NONE SEEN
Epithelial Cells (non renal): NONE SEEN /hpf (ref 0–10)
Renal Epithel, UA: NONE SEEN /hpf

## 2021-07-13 NOTE — Progress Notes (Signed)
Subjective: 1. Hypogonadism in male   2. Secondary polycythemia   3. BPH with urinary obstruction   4. Urgency of urination   5. Erectile dysfunction due to arterial insufficiency      Consult requested by Hendricks Limes NP  Jesse Castillo is a 56 yo male who is sent for a history of hypogonadism that is being managed with Testosterone cypionate 200mg  q2wks.  His level is 589.  His Hgb is minimally increased at 17.9.  It was 17.7 in 8/22 which was up from 15.4 in 11/21.  He has improved energy with the med.  His PSA was 0.8 on 02/08/21.  He has moderate LUTS with an IPSS of 9.  He can have some urgency and then not need to void.  He has some post void dribbling. He has no prior stones, UTI's or GU surgery.   He has ED and is on sildenafil and  tadalafil with an improved response but he has decreased sensation and can't reach a climax.  He has been placed on Prozac.  He doesn't have any problems with the injections.  He has had some increased irritability for the last 1.92months.   He is seeing a therapist for that.   ROS:  ROS  Allergies  Allergen Reactions   Mobic [Meloxicam] Other (See Comments)    Mouth sores    Past Medical History:  Diagnosis Date   Enlarged prostate    Neuropathy    RLE   Restless leg    Sleep apnea     Past Surgical History:  Procedure Laterality Date   HERNIA REPAIR      Social History   Socioeconomic History   Marital status: Married    Spouse name: Not on file   Number of children: Not on file   Years of education: Not on file   Highest education level: Not on file  Occupational History   Not on file  Tobacco Use   Smoking status: Former    Packs/day: 3.00    Years: 18.00    Pack years: 54.00    Types: Cigarettes    Quit date: 07/03/2007    Years since quitting: 14.0   Smokeless tobacco: Never  Vaping Use   Vaping Use: Never used  Substance and Sexual Activity   Alcohol use: Never   Drug use: Never   Sexual activity: Not on file  Other Topics  Concern   Not on file  Social History Narrative   Not on file   Social Determinants of Health   Financial Resource Strain: Not on file  Food Insecurity: Not on file  Transportation Needs: Not on file  Physical Activity: Not on file  Stress: Not on file  Social Connections: Not on file  Intimate Partner Violence: Not on file    Family History  Problem Relation Age of Onset   Heart disease Mother    Tremor Father    Hypertension Daughter    Heart disease Maternal Grandmother    Parkinson's disease Paternal Grandmother     Anti-infectives: Anti-infectives (From admission, onward)    None       Current Outpatient Medications  Medication Sig Dispense Refill   albuterol (VENTOLIN HFA) 108 (90 Base) MCG/ACT inhaler Inhale 2 puffs into the lungs every 6 (six) hours as needed for wheezing or shortness of breath.     amLODipine (NORVASC) 5 MG tablet Take 1 tablet (5 mg total) by mouth daily. 30 tablet 2   aspirin EC 81 MG  tablet Take 81 mg by mouth daily.     diclofenac (VOLTAREN) 75 MG EC tablet TAKE ONE TABLET (75MG  TOTAL) BY MOUTH TWO TIMES DAILY 180 tablet 1   famotidine (PEPCID) 20 MG tablet Take 3 tablets (60 mg total) by mouth at bedtime. 270 tablet 1   FLUoxetine (PROZAC) 20 MG capsule Take 1 capsule (20 mg total) by mouth daily. 30 capsule 2   fluticasone (FLONASE) 50 MCG/ACT nasal spray Place 2 sprays into both nostrils daily. 16 g 6   fluticasone-salmeterol (ADVAIR) 250-50 MCG/ACT AEPB Inhale 1 puff into the lungs in the morning and at bedtime.     gabapentin (NEURONTIN) 100 MG capsule Take 1 capsule (100 mg total) by mouth 3 (three) times daily. 90 capsule 2   hydrochlorothiazide (HYDRODIURIL) 50 MG tablet Take 1 tablet (50 mg total) by mouth daily. 90 tablet 1   levocetirizine (XYZAL) 5 MG tablet Take 1 tablet (5 mg total) by mouth every evening. 90 tablet 1   methocarbamol (ROBAXIN) 500 MG tablet Take 1 tablet (500 mg total) by mouth every 8 (eight) hours as needed  for muscle spasms. 60 tablet 2   Misc Natural Products (URINOZINC PLUS PO) Take by mouth.     Multiple Vitamins-Minerals (MULTIVITAMIN MEN PO) Take by mouth.     NEEDLE, DISP, 23 G (BD DISP NEEDLE) 23G X 1" MISC Use every 14 days with testosterone 50 each 1   pantoprazole (PROTONIX) 40 MG tablet TAKE ONE TABLET (40MG  TOTAL) BY MOUTH DAILY 90 tablet 1   Probiotic Product (HEALTHY COLON PO) Take by mouth.     rOPINIRole (REQUIP) 4 MG tablet Take 1 tablet (4 mg total) by mouth at bedtime. 90 tablet 1   sildenafil (VIAGRA) 100 MG tablet Take 1 tablet (100 mg total) by mouth daily as needed. 30 tablet 2   Specialty Vitamins Products (ECHINACEA C COMPLETE PO) Take by mouth.     Syringe/Needle, Disp, 18G X 1" 3 ML MISC 1 Device by Does not apply route every 14 (fourteen) days. 12 each 1   tadalafil (CIALIS) 5 MG tablet Take 1 tablet (5 mg total) by mouth daily as needed for erectile dysfunction. 30 tablet 11   testosterone cypionate (DEPOTESTOSTERONE CYPIONATE) 200 MG/ML injection Inject 0.5 mLs (100 mg total) into the muscle once a week. 10 mL 1   triamcinolone cream (KENALOG) 0.1 % Apply 1 application topically 2 (two) times daily. 80 g 1   No current facility-administered medications for this visit.     Objective: Vital signs in last 24 hours: BP (!) 147/89    Pulse 76    Ht 5\' 11"  (1.803 m)    Wt (!) 325 lb (147.4 kg)    BMI 45.33 kg/m   Intake/Output from previous day: No intake/output data recorded. Intake/Output this shift: @IOTHISSHIFT @   Physical Exam  Lab Results:  Recent Results (from the past 2160 hour(s))  Hemoglobin and hematocrit, blood     Status: Abnormal   Collection Time: 07/11/21  5:44 AM  Result Value Ref Range   Hemoglobin 17.9 (H) 13.0 - 17.7 g/dL   Hematocrit 51.3 (H) 37.5 - 51.0 %  Testosterone     Status: None   Collection Time: 07/11/21  8:57 AM  Result Value Ref Range   Testosterone 589 264 - 916 ng/dL    Comment: Adult male reference interval is based  on a population of healthy nonobese males (BMI <30) between 7 and 27 years old. [place], [place] [id number].  PMID: [id number].   Urinalysis, Routine w reflex microscopic     Status: Abnormal   Collection Time: 07/13/21 12:03 PM  Result Value Ref Range   Specific Gravity, UA 1.025 1.005 - 1.030   pH, UA 6.0 5.0 - 7.5   Color, UA Yellow Yellow   Appearance Ur Clear Clear   Leukocytes,UA Trace (A) Negative   Protein,UA Negative Negative/Trace   Glucose, UA Negative Negative   Ketones, UA Negative Negative   RBC, UA Trace (A) Negative   Bilirubin, UA Negative Negative   Urobilinogen, Ur 0.2 0.2 - 1.0 mg/dL   Nitrite, UA Negative Negative   Microscopic Examination See below:   Microscopic Examination     Status: None   Collection Time: 07/13/21 12:03 PM   Urine  Result Value Ref Range   WBC, UA 0-5 0 - 5 /hpf   RBC 0-2 0 - 2 /hpf   Epithelial Cells (non renal) None seen 0 - 10 /hpf   Renal Epithel, UA None seen None seen /hpf   Bacteria, UA None seen None seen/Few     BMET No results for input(s): NA, K, CL, CO2, GLUCOSE, BUN, CREATININE, CALCIUM in the last 72 hours. PT/INR No results for input(s): LABPROT, INR in the last 72 hours. ABG No results for input(s): PHART, HCO3 in the last 72 hours.  Invalid input(s): PCO2, PO2  Studies/Results: No results found.   Assessment/Plan: Hypogonadism.  He is doing well on current therapy and his script is current.   Polycythemia.  His Hgb is up only slightly.  If it continues to rise at f/u, I will send him to the cancer center for a therapuetic phlebotomy.  ED.  He is responding to the combination of daily tadalafil and sildenafil prn but has reduced sensation and delayed ejaculation. His scripts are current.  I have recommended he consider changing from the Prozac, possibly to Wellbutrin, and he will reach out to the prescriber.    I also suggest a stimulatory lubricant.   BPH with urgency.  This has improved with  the tadalafil.   Morbid obesity.   I previoulsy explained the impact of obesity on many of his conditions and the importance of weight loss.   No orders of the defined types were placed in this encounter.     Orders Placed This Encounter  Procedures   Microscopic Examination   Urinalysis, Routine w reflex microscopic   Hemoglobin and hematocrit, blood    Standing Status:   Future    Standing Expiration Date:   07/13/2022   Testosterone    Standing Status:   Future    Standing Expiration Date:   07/13/2022   PSA, total and free    Standing Status:   Future    Standing Expiration Date:   07/13/2022     Return in about 6 months (around 01/10/2022) for with labs.    CC: Hendricks Limes FNP     Irine Seal 07/14/2021 [id number] Patient ID: Thereasa Solo, male   DOB: 11-30-1965, 56 y.o.   MRN: [id number]

## 2021-07-13 NOTE — Progress Notes (Signed)
Urological Symptom Review  Patient is experiencing the following symptoms: Leakage of urine Stream starts and stops Trouble starting stream Erection problems (male only)   Review of Systems  Gastrointestinal (upper)  : Negative for upper GI symptoms  Gastrointestinal (lower) : Negative for lower GI symptoms  Constitutional : Negative for symptoms  Skin: Negative for skin symptoms  Eyes: Negative for eye symptoms  Ear/Nose/Throat : Negative for Ear/Nose/Throat symptoms  Hematologic/Lymphatic: Negative for Hematologic/Lymphatic symptoms  Cardiovascular : Negative for cardiovascular symptoms  Respiratory : Cough  Endocrine: Negative for endocrine symptoms  Musculoskeletal: Negative for musculoskeletal symptoms  Neurological: Negative for neurological symptoms  Psychologic: Negative for psychiatric symptoms

## 2021-07-16 MED ORDER — CITALOPRAM HYDROBROMIDE 20 MG PO TABS: 20.0000 mg | ORAL_TABLET | Freq: Every day | ORAL | 2 refills | Status: DC

## 2021-07-21 ENCOUNTER — Telehealth: Payer: Self-pay | Admitting: *Deleted

## 2021-07-21 DIAGNOSIS — R7989 Other specified abnormal findings of blood chemistry: Secondary | ICD-10-CM

## 2021-07-21 NOTE — Telephone Encounter (Signed)
Jesse Castillo Jesse Castillo - PA Case [id number] Need help? Call us at [phone] Status Sent to [employer]

## 2021-07-24 ENCOUNTER — Other Ambulatory Visit: Payer: Self-pay

## 2021-07-24 ENCOUNTER — Ambulatory Visit
Admission: EM | Admit: 2021-07-24 | Discharge: 2021-07-24 | Disposition: A | Discharge status: Home / Self Care | Payer: 59 | Attending: Family Medicine | Admitting: Family Medicine

## 2021-07-24 ENCOUNTER — Encounter: Payer: Self-pay | Admitting: Emergency Medicine

## 2021-07-24 DIAGNOSIS — J0110 Acute frontal sinusitis, unspecified: Secondary | ICD-10-CM | POA: Diagnosis not present

## 2021-07-24 DIAGNOSIS — J3089 Other allergic rhinitis: Secondary | ICD-10-CM | POA: Diagnosis not present

## 2021-07-24 MED ORDER — AMOXICILLIN-POT CLAVULANATE 875-125 MG PO TABS: 1.0000 | ORAL_TABLET | Freq: Two times a day (BID) | ORAL | 0 refills | Status: DC

## 2021-07-24 MED ORDER — PREDNISONE 20 MG PO TABS: 40.0000 mg | ORAL_TABLET | Freq: Every day | ORAL | 0 refills | Status: DC

## 2021-07-24 NOTE — ED Provider Notes (Signed)
[facility]    CSN: [id number] Arrival date & time: 07/24/21  1131      History   Chief Complaint Chief Complaint  Patient presents with   Eye Problem   Otalgia    HPI Jesse Castillo is a 56 y.o. male.   Presenting today with about a month of left-sided ear pressure and pain, nasal congestion, sinus pressure and pain and now significant pressure behind his left eye that feels like it is coming from his sinuses.  He states when the pressure is significant he starts having some blurred vision off and on but not currently having any of the symptoms.  Denies fever, chills, difficulty swallowing or speaking, extremity weakness, numbness, tingling, headaches, difficulty breathing apart from his sleep apnea symptoms at night.  Trying Tylenol, Xyzal, Zyrtec with no relief.  Does have a history of seasonal allergies, COPD.  Past Medical History:  Diagnosis Date   Enlarged prostate    Neuropathy    RLE   Restless leg    Sleep apnea     Patient Active Problem List   Diagnosis Date Noted   Essential tremor 04/24/2021   Seasonal allergies [id number]   Umbilical hernia without obstruction and without gangrene 04/24/2021   Controlled substance agreement signed 02/02/2021   COPD without exacerbation ([place]) 02/02/2021   Erectile dysfunction 08/04/2020   Gastroesophageal reflux disease 12/21/2019   Essential hypertension 09/20/2019   Arthritis 09/20/2019   Morbid obesity ([place]) 09/20/2019   Low testosterone in male 09/18/2019   Enlarged prostate    Neuropathy    Restless leg    OSA on CPAP     Past Surgical History:  Procedure Laterality Date   HERNIA REPAIR         Home Medications    Prior to Admission medications   Medication Sig Start Date End Date Taking? Authorizing Provider  amLODipine (NORVASC) 5 MG tablet Take 1 tablet (5 mg total) by mouth daily. 06/07/21  Yes Loman Brooklyn, FNP  amoxicillin-clavulanate (AUGMENTIN) 875-125 MG tablet Take 1 tablet by  mouth every 12 (twelve) hours. 07/24/21  Yes Volney American, PA-C  aspirin EC 81 MG tablet Take 81 mg by mouth daily.   Yes [provider]  citalopram (CELEXA) 20 MG tablet Take 1 tablet (20 mg total) by mouth daily. 07/16/21  Yes Hendricks Limes F, FNP  diclofenac (VOLTAREN) 75 MG EC tablet TAKE ONE TABLET (75MG  TOTAL) BY MOUTH TWO TIMES DAILY 02/23/21  Yes Loman Brooklyn, FNP  famotidine (PEPCID) 20 MG tablet Take 3 tablets (60 mg total) by mouth at bedtime. 02/02/21  Yes Hendricks Limes F, FNP  fluticasone-salmeterol (ADVAIR) 250-50 MCG/ACT AEPB Inhale 1 puff into the lungs in the morning and at bedtime.   Yes [provider]  hydrochlorothiazide (HYDRODIURIL) 50 MG tablet Take 1 tablet (50 mg total) by mouth daily. 04/18/21  Yes Hendricks Limes F, FNP  levocetirizine (XYZAL) 5 MG tablet Take 1 tablet (5 mg total) by mouth every evening. 04/18/21  Yes Hendricks Limes F, FNP  Multiple Vitamins-Minerals (MULTIVITAMIN MEN PO) Take by mouth.   Yes [provider]  NEEDLE, DISP, 23 G (BD DISP NEEDLE) 23G X 1" MISC Use every 14 days with testosterone 02/21/21  Yes Hendricks Limes F, FNP  pantoprazole (PROTONIX) 40 MG tablet TAKE ONE TABLET (40MG  TOTAL) BY MOUTH DAILY 02/02/21  Yes Hendricks Limes F, FNP  predniSONE (DELTASONE) 20 MG tablet Take 2 tablets (40 mg total) by mouth daily with  breakfast. 07/24/21  Yes Volney American, PA-C  Probiotic Product (HEALTHY COLON PO) Take by mouth.   Yes [provider]  rOPINIRole (REQUIP) 4 MG tablet Take 1 tablet (4 mg total) by mouth at bedtime. 02/02/21  Yes Loman Brooklyn, FNP  Specialty Vitamins Products (ECHINACEA C COMPLETE PO) Take by mouth.   Yes [provider]  tadalafil (CIALIS) 5 MG tablet Take 1 tablet (5 mg total) by mouth daily as needed for erectile dysfunction. 04/06/21  Yes Irine Seal, MD  testosterone cypionate (DEPOTESTOSTERONE CYPIONATE) 200 MG/ML injection Inject 0.5 mLs (100 mg total) into the  muscle once a week. 06/07/21  Yes Hendricks Limes F, FNP  albuterol (VENTOLIN HFA) 108 (90 Base) MCG/ACT inhaler Inhale 2 puffs into the lungs every 6 (six) hours as needed for wheezing or shortness of breath.    [provider]  fluticasone (FLONASE) 50 MCG/ACT nasal spray Place 2 sprays into both nostrils daily. 06/07/21   Loman Brooklyn, FNP  gabapentin (NEURONTIN) 100 MG capsule Take 1 capsule (100 mg total) by mouth 3 (three) times daily. 04/18/21   Loman Brooklyn, FNP  methocarbamol (ROBAXIN) 500 MG tablet Take 1 tablet (500 mg total) by mouth every 8 (eight) hours as needed for muscle spasms. 06/07/21   Loman Brooklyn, FNP  Misc Natural Products [facility] PLUS PO) Take by mouth.    [provider]  sildenafil (VIAGRA) 100 MG tablet Take 1 tablet (100 mg total) by mouth daily as needed. 06/28/21   Loman Brooklyn, FNP  Syringe/Needle, Disp, 18G X 1" 3 ML MISC 1 Device by Does not apply route every 14 (fourteen) days. 01/19/20   Loman Brooklyn, FNP  triamcinolone cream (KENALOG) 0.1 % Apply 1 application topically 2 (two) times daily. 04/18/21   Loman Brooklyn, FNP    Family History Family History  Problem Relation Age of Onset   Heart disease Mother    Tremor Father    Hypertension Daughter    Heart disease Maternal Grandmother    Parkinson's disease Paternal Grandmother     Social History Social History   Tobacco Use   Smoking status: Former    Packs/day: 3.00    Years: 18.00    Pack years: 54.00    Types: Cigarettes    Quit date: 07/03/2007    Years since quitting: 14.0   Smokeless tobacco: Never  Vaping Use   Vaping Use: Never used  Substance Use Topics   Alcohol use: Never   Drug use: Never     Allergies   Mobic [meloxicam]   Review of Systems Review of Systems Per HPI  Physical Exam Triage Vital Signs ED Triage Vitals  Enc Vitals Group     BP 07/24/21 1258 140/82     Pulse Rate 07/24/21 1258 77     Resp 07/24/21 1258 20      Temp 07/24/21 1258 98.1 F (36.7 C)     Temp Source 07/24/21 1258 Oral     SpO2 07/24/21 1258 96 %     Weight --      Height --      Head Circumference --      Peak Flow --      Pain Score 07/24/21 1303 6     Pain Loc --      Pain Edu? --      Excl. in [place]? --    No data found.  Updated Vital Signs BP 140/82 (BP Location: Right  Arm)    Pulse 77    Temp 98.1 F (36.7 C) (Oral)    Resp 20    SpO2 96%   Visual Acuity Right Eye Distance: 20/60 Left Eye Distance: 20/60 Bilateral Distance: 20/40  Right Eye Near:   Left Eye Near:    Bilateral Near:     Physical Exam Vitals and nursing note reviewed.  Constitutional:      Appearance: Normal appearance.  HENT:     Head: Atraumatic.     Ears:     Comments: Bilateral middle ear effusion    Nose: Congestion present.     Mouth/Throat:     Mouth: Mucous membranes are moist.     Pharynx: Posterior oropharyngeal erythema present.  Eyes:     Extraocular Movements: Extraocular movements intact.     Conjunctiva/sclera: Conjunctivae normal.  Cardiovascular:     Rate and Rhythm: Normal rate and regular rhythm.  Pulmonary:     Effort: Pulmonary effort is normal.     Breath sounds: Normal breath sounds.  Musculoskeletal:        General: Normal range of motion.     Cervical back: Normal range of motion and neck supple.  Skin:    General: Skin is warm and dry.  Neurological:     General: No focal deficit present.     Mental Status: He is oriented to person, place, and time.     Cranial Nerves: No cranial nerve deficit.     Motor: No weakness.     Gait: Gait normal.  Psychiatric:        Mood and Affect: Mood normal.        Thought Content: Thought content normal.        Judgment: Judgment normal.     UC Treatments / Results  Labs (all labs ordered are listed, but only abnormal results are displayed) Labs Reviewed - No data to display  EKG   Radiology No results found.  Procedures Procedures (including critical care  time)  Medications Ordered in UC Medications - No data to display  Initial Impression / Assessment and Plan / UC Course  I have reviewed the triage vital signs and the nursing notes.  Pertinent labs & imaging results that were available during my care of the patient were reviewed by me and considered in my medical decision making (see chart for details).     Possibly sinusitis secondary to a uncontrolled seasonal allergies causing his ear and eye symptoms.  We will treat with short burst of prednisone, antibiotics and consistent allergy regimen with Zyrtec, Flonase which he states he does have both at home.  Close follow-up with eye specialist, PCP recommended for recheck symptoms.  Return for acutely worsening symptoms at any time.  Final Clinical Impressions(s) / UC Diagnoses   Final diagnoses:  Acute non-recurrent frontal sinusitis  Seasonal allergic rhinitis due to other allergic trigger   Discharge Instructions   None    ED Prescriptions     Medication Sig Dispense Auth. Provider   predniSONE (DELTASONE) 20 MG tablet Take 2 tablets (40 mg total) by mouth daily with breakfast. 6 tablet Volney American, PA-C   amoxicillin-clavulanate (AUGMENTIN) 875-125 MG tablet Take 1 tablet by mouth every 12 (twelve) hours. 14 tablet Volney American, [place]      PDMP not reviewed this encounter.   Volney American, [place] 07/24/21 1404

## 2021-07-24 NOTE — Telephone Encounter (Signed)
Deniedon January 22 Your PA request has been denied. Additional information will be provided in the denial communication. (Message 1140)  You do not meet the requirements of your plan. Your plan covers this drug when you meet one of these conditions: - You have primary or hypogonadotropic hypogonadism - You have gender dysphoria, and you can make an informed decision to use this drug. Your request has been denied based on the information we have. An explanation of the full clinical review criteria used in our decision is included in this notice.

## 2021-07-24 NOTE — ED Triage Notes (Addendum)
Patient c/o pain behind LFT eye x 3-4 days.   Patient c/o LFT sided ear pain x 1 month.   Patient endorses nausea. Patient endorses SOB that worsens at night.   Patient endorses LFT sided headache.   Patient endorses blurred vision at times.   History of Sleep Apnea.   Patient has taken Tylenol, Xyzal, and zyrtec with no relief of symptoms.

## 2021-07-24 NOTE — Telephone Encounter (Signed)
Renewed and added more data to PA request:  [place]# [id number] Drug Depo-Testosterone 200MG /ML solution Sent to Plan today

## 2021-07-25 NOTE — Telephone Encounter (Signed)
Question response came in by fax - filled out, signed by Alona Bene - and faxed back 07/25/21-jhb

## 2021-07-26 NOTE — Telephone Encounter (Signed)
Approved on January 24 Your PA request has been approved. Additional information will be provided in the approval communication. (Message 1145) Pharm aware

## 2021-08-01 ENCOUNTER — Other Ambulatory Visit: Payer: Self-pay | Admitting: Family Medicine

## 2021-08-01 DIAGNOSIS — G2581 Restless legs syndrome: Secondary | ICD-10-CM

## 2021-08-01 DIAGNOSIS — J302 Other seasonal allergic rhinitis: Secondary | ICD-10-CM

## 2021-08-02 ENCOUNTER — Encounter: Payer: Self-pay | Admitting: Family Medicine

## 2021-08-02 ENCOUNTER — Ambulatory Visit (INDEPENDENT_AMBULATORY_CARE_PROVIDER_SITE_OTHER): Payer: 59 | Admitting: Family Medicine

## 2021-08-02 VITALS — BP 143/88 | HR 88 | Temp 97.9°F | Ht 71.0 in | Wt 331.0 lb

## 2021-08-02 DIAGNOSIS — F419 Anxiety disorder, unspecified: Secondary | ICD-10-CM | POA: Diagnosis not present

## 2021-08-02 DIAGNOSIS — R69 Illness, unspecified: Secondary | ICD-10-CM | POA: Diagnosis not present

## 2021-08-02 DIAGNOSIS — G479 Sleep disorder, unspecified: Secondary | ICD-10-CM | POA: Diagnosis not present

## 2021-08-02 MED ORDER — HYDROXYZINE PAMOATE 25 MG PO CAPS
25.0000 mg | ORAL_CAPSULE | Freq: Every evening | ORAL | 2 refills | Status: DC | PRN
Start: 2021-08-02 — End: 2021-10-25

## 2021-08-02 NOTE — Progress Notes (Signed)
Assessment & Plan:  1. Anxiety/Difficulty sleeping - discussed that Prozac was changed to Celexa to alleviate performance issues and patient states he will pick up prescription and try it - add hydroxyzine to help provide relief of anxiety until Celexa has time to build up in his system - education provided on mindfulness based stress reduction - hydrOXYzine (VISTARIL) 25 MG capsule; Take 1 capsule (25 mg total) by mouth at bedtime as needed for anxiety.  Dispense: 30 capsule; Refill: 2   Follow up plan: Return as scheduled.  Floy Sabina, NP Student  I personally was present during the history, physical exam, and medical decision-making activities of this service and have verified that the service and findings are accurately documented in the nurse practitioner student's note.  Deliah Boston, MSN, APRN, FNP-C [facility]  Subjective:   Patient ID: Jesse Castillo, male    DOB: 1966/03/30, 56 y.o.   MRN: [id number]  HPI: Jesse Castillo is a 56 y.o. male presenting on 08/02/2021 for trouble sleeping (X 1 month. States when he tries to go to sleep he starts having trouble breathing and having a panic attack. )  Patient is accompanied by his wife, who he is okay with being present.   He reports nightly panic attacks that are impeding his ability to sleep. He was taken off Prozac a month ago and given a prescription for Celexa which he has not started due to concerns about sexual performance. He states he has been wearing his CPAP nightly so it is not related to sleep apnea.   GAD 7 : Generalized Anxiety Score 08/02/2021 06/07/2021 04/18/2021 02/02/2021  Nervous, Anxious, on Edge 3 1 3 2   Control/stop worrying 1 1 0 2  Worry too much - different things 1 1 0 2  Trouble relaxing 3 2 2 1   Restless 2 2 1 2   Easily annoyed or irritable 3 1 3 3   Afraid - awful might happen 1 1 0 0  Total GAD 7 Score 14 9 9 12   Anxiety Difficulty Very difficult Somewhat difficult Extremely  difficult Somewhat difficult     ROS: Negative unless specifically indicated above in HPI.   Relevant past medical history reviewed and updated as indicated.   Allergies and medications reviewed and updated.   Current Outpatient Medications:    albuterol (VENTOLIN HFA) 108 (90 Base) MCG/ACT inhaler, Inhale 2 puffs into the lungs every 6 (six) hours as needed for wheezing or shortness of breath., Disp: , Rfl:    amLODipine (NORVASC) 5 MG tablet, Take 1 tablet (5 mg total) by mouth daily., Disp: 30 tablet, Rfl: 2   aspirin EC 81 MG tablet, Take 81 mg by mouth daily., Disp: , Rfl:    diclofenac (VOLTAREN) 75 MG EC tablet, TAKE ONE TABLET (75MG  TOTAL) BY MOUTH TWO TIMES DAILY, Disp: 180 tablet, Rfl: 1   famotidine (PEPCID) 20 MG tablet, Take 3 tablets (60 mg total) by mouth at bedtime., Disp: 270 tablet, Rfl: 1   fluticasone (FLONASE) 50 MCG/ACT nasal spray, Place 2 sprays into both nostrils daily., Disp: 16 g, Rfl: 6   fluticasone-salmeterol (ADVAIR) 250-50 MCG/ACT AEPB, Inhale 1 puff into the lungs in the morning and at bedtime., Disp: , Rfl:    hydrochlorothiazide (HYDRODIURIL) 50 MG tablet, Take 1 tablet (50 mg total) by mouth daily., Disp: 90 tablet, Rfl: 1   hydrOXYzine (VISTARIL) 25 MG capsule, Take 1 capsule (25 mg total) by mouth at bedtime as needed for anxiety., Disp:  30 capsule, Rfl: 2   levocetirizine (XYZAL) 5 MG tablet, TAKE 1 TABLET BY MOUTH EVERY EVENING, Disp: 30 tablet, Rfl: 5   Misc Natural Products (URINOZINC PLUS PO), Take by mouth., Disp: , Rfl:    Multiple Vitamins-Minerals (MULTIVITAMIN MEN PO), Take by mouth., Disp: , Rfl:    NEEDLE, DISP, 23 G (BD DISP NEEDLE) 23G X 1" MISC, Use every 14 days with testosterone, Disp: 50 each, Rfl: 1   pantoprazole (PROTONIX) 40 MG tablet, TAKE ONE TABLET (40MG  TOTAL) BY MOUTH DAILY, Disp: 90 tablet, Rfl: 1   Probiotic Product (HEALTHY COLON PO), Take by mouth., Disp: , Rfl:    rOPINIRole (REQUIP) 4 MG tablet, TAKE ONE TABLET (4MG   TOTAL) BY MOUTH AT BEDTIME, Disp: 90 tablet, Rfl: 1   sildenafil (VIAGRA) 100 MG tablet, Take 1 tablet (100 mg total) by mouth daily as needed., Disp: 30 tablet, Rfl: 2   Specialty Vitamins Products (ECHINACEA C COMPLETE PO), Take by mouth., Disp: , Rfl:    Syringe/Needle, Disp, 18G X 1" 3 ML MISC, 1 Device by Does not apply route every 14 (fourteen) days., Disp: 12 each, Rfl: 1   tadalafil (CIALIS) 5 MG tablet, Take 1 tablet (5 mg total) by mouth daily as needed for erectile dysfunction., Disp: 30 tablet, Rfl: 11   testosterone cypionate (DEPOTESTOSTERONE CYPIONATE) 200 MG/ML injection, Inject 0.5 mLs (100 mg total) into the muscle once a week., Disp: 10 mL, Rfl: 1   triamcinolone cream (KENALOG) 0.1 %, Apply 1 application topically 2 (two) times daily., Disp: 80 g, Rfl: 1   citalopram (CELEXA) 20 MG tablet, Take 1 tablet (20 mg total) by mouth daily. (Patient not taking: Reported on 08/02/2021), Disp: 30 tablet, Rfl: 2  Allergies  Allergen Reactions   Mobic [Meloxicam] Other (See Comments)    Mouth sores    Objective:   BP (!) 143/88    Pulse 88    Temp 97.9 F (36.6 C)    Ht 5\' 11"  (1.803 m)    Wt (!) 150.1 kg    BMI 46.17 kg/m    Physical Exam Vitals reviewed.  Constitutional:      General: He is not in acute distress.    Appearance: Normal appearance. He is morbidly obese. He is not ill-appearing, toxic-appearing or diaphoretic.  HENT:     Head: Normocephalic and atraumatic.  Eyes:     General: No scleral icterus.       Right eye: No discharge.        Left eye: No discharge.     Conjunctiva/sclera: Conjunctivae normal.  Cardiovascular:     Rate and Rhythm: Normal rate and regular rhythm.     Heart sounds: Normal heart sounds. No murmur heard.   No friction rub. No gallop.  Pulmonary:     Effort: Pulmonary effort is normal. No respiratory distress.     Breath sounds: Normal breath sounds. No stridor. No wheezing, rhonchi or rales.  Musculoskeletal:        General: Normal  range of motion.     Cervical back: Normal range of motion.     Right lower leg: No edema.     Left lower leg: No edema.  Skin:    General: Skin is warm and dry.  Neurological:     Mental Status: He is alert and oriented to person, place, and time. Mental status is at baseline.  Psychiatric:        Mood and Affect: Mood normal.  Behavior: Behavior normal.        Thought Content: Thought content normal.        Judgment: Judgment normal.

## 2021-08-08 ENCOUNTER — Other Ambulatory Visit: Payer: Self-pay | Admitting: Family Medicine

## 2021-08-08 DIAGNOSIS — J449 Chronic obstructive pulmonary disease, unspecified: Secondary | ICD-10-CM

## 2021-08-10 ENCOUNTER — Encounter: Payer: Self-pay | Admitting: Family Medicine

## 2021-08-22 ENCOUNTER — Encounter: Payer: Self-pay | Admitting: Neurology

## 2021-08-22 ENCOUNTER — Ambulatory Visit: Payer: 59 | Admitting: Neurology

## 2021-08-22 ENCOUNTER — Other Ambulatory Visit: Payer: Self-pay

## 2021-08-22 VITALS — BP 130/81 | HR 81 | Ht 71.0 in | Wt 327.0 lb

## 2021-08-22 DIAGNOSIS — R251 Tremor, unspecified: Secondary | ICD-10-CM | POA: Diagnosis not present

## 2021-08-22 NOTE — Progress Notes (Signed)
Subjective:    Patient ID: Jesse Castillo is a 56 y.o. male.  HPI    Jesse FoleySaima Varnika Butz, MD, PhD [facility] [address], [address] [address] [place], [place] [id number]  Dear Jesse Castillo,   I saw your patient, Jesse Castillo, upon your kind request, in my neurologic clinic today for initial consultation of his hand tremor.  The patient is accompanied by his wife today.  As you know, Jesse Castillo is a 56 year old right-handed gentleman with an underlying medical history of COPD, neuropathy, restless leg syndrome, sleep apnea, BPH, and severe obesity with a BMI of over 45, who reports a longstanding history of a left hand tremor of over 20 years.  He has previously been treated for this with propranolol, as he recalls he was given 20 mg once daily and it did not help.  I reviewed your office note from 06/07/2021.  Gabapentin has not been helpful for his tremor.  He then tried propranolol again in low-dose and it did not help.    He denies any tremor in the right hand or in the legs.  He has not fallen recently. He reports no family history of tremors with the exception of paternal grandmother who had Parkinson's disease. He drinks caffeine in the form of soda occasionally and 1 cup of coffee per day on average, he admits that he does not always hydrate very well but tries to get 3-4 bottles of water per day.  He quit smoking in 2009.  He currently does not consume any alcohol.  He sleeps fairly well but does endorse restless leg symptoms, his sleep specialist is Dr. Craige CottaSood and he had a recent new CPAP machine issued.  He reports compliance with his CPAP.  He had a remote brain MRI with and without contrast on 04/26/2002 and I reviewed the results: IMPRESSION  UNREMARKABLE HEAD AND PITUITARY GLAND.  THERE IS NO EVIDENCE OF A PITUITARY MASS.  Anxiety and depression he has been on Celexa.  He reports side effects particularly sexual side effects from taking Celexa.  He was on Prozac before  that.  He also has a prescription for hydroxyzine.     His Past Medical History Is Significant For: Past Medical History:  Diagnosis Date   Enlarged prostate    Neuropathy    RLE   Restless leg    Sleep apnea     His Past Surgical History Is Significant For: Past Surgical History:  Procedure Laterality Date   HERNIA REPAIR      His Family History Is Significant For: Family History  Problem Relation Age of Onset   Heart disease Mother    Tremor Father    Hypertension Daughter    Heart disease Maternal Grandmother    Parkinson's disease Paternal Grandmother     His Social History Is Significant For: Social History   Socioeconomic History   Marital status: Married    Spouse name: Not on file   Number of children: Not on file   Years of education: Not on file   Highest education level: Not on file  Occupational History   Not on file  Tobacco Use   Smoking status: Former    Packs/day: 3.00    Years: 18.00    Pack years: 54.00    Types: Cigarettes    Quit date: 07/03/2007    Years since quitting: 14.1   Smokeless tobacco: Never  Vaping Use   Vaping Use: Never used  Substance and Sexual Activity  Alcohol use: Never   Drug use: Never   Sexual activity: Not on file  Other Topics Concern   Not on file  Social History Narrative   Not on file   Social Determinants of Health   Financial Resource Strain: Not on file  Food Insecurity: Not on file  Transportation Needs: Not on file  Physical Activity: Not on file  Stress: Not on file  Social Connections: Not on file    His Allergies Are:  Allergies  Allergen Reactions   Mobic [Meloxicam] Other (See Comments)    Mouth sores  :   His Current Medications Are:  Outpatient Encounter Medications as of 08/22/2021  Medication Sig   ADVAIR DISKUS 250-50 MCG/ACT AEPB INHALE ONE PUFF INTO THE LUNGS IN THE MORNING AND AT BEDTIME.   amLODipine (NORVASC) 5 MG tablet Take 1 tablet (5 mg total) by mouth daily.    aspirin EC 81 MG tablet Take 81 mg by mouth daily.   citalopram (CELEXA) 20 MG tablet Take 1 tablet (20 mg total) by mouth daily.   diclofenac (VOLTAREN) 75 MG EC tablet TAKE ONE TABLET (75MG  TOTAL) BY MOUTH TWO TIMES DAILY   famotidine (PEPCID) 20 MG tablet Take 3 tablets (60 mg total) by mouth at bedtime.   fluticasone (FLONASE) 50 MCG/ACT nasal spray Place 2 sprays into both nostrils daily.   hydrochlorothiazide (HYDRODIURIL) 50 MG tablet Take 1 tablet (50 mg total) by mouth daily.   hydrOXYzine (VISTARIL) 25 MG capsule Take 1 capsule (25 mg total) by mouth at bedtime as needed for anxiety.   Misc Natural Products (URINOZINC PLUS PO) Take by mouth.   Multiple Vitamins-Minerals (MULTIVITAMIN MEN PO) Take by mouth.   NEEDLE, DISP, 23 G (BD DISP NEEDLE) 23G X 1" MISC Use every 14 days with testosterone   pantoprazole (PROTONIX) 40 MG tablet TAKE ONE TABLET (40MG  TOTAL) BY MOUTH DAILY   Probiotic Product (HEALTHY COLON PO) Take by mouth.   rOPINIRole (REQUIP) 4 MG tablet TAKE ONE TABLET (4MG  TOTAL) BY MOUTH AT BEDTIME   sildenafil (VIAGRA) 100 MG tablet Take 1 tablet (100 mg total) by mouth daily as needed.   Specialty Vitamins Products (ECHINACEA C COMPLETE PO) Take by mouth.   Syringe/Needle, Disp, 18G X 1" 3 ML MISC 1 Device by Does not apply route every 14 (fourteen) days.   tadalafil (CIALIS) 5 MG tablet Take 1 tablet (5 mg total) by mouth daily as needed for erectile dysfunction.   testosterone cypionate (DEPOTESTOSTERONE CYPIONATE) 200 MG/ML injection Inject 0.5 mLs (100 mg total) into the muscle once a week.   albuterol (VENTOLIN HFA) 108 (90 Base) MCG/ACT inhaler Inhale 2 puffs into the lungs every 6 (six) hours as needed for wheezing or shortness of breath.   levocetirizine (XYZAL) 5 MG tablet TAKE 1 TABLET BY MOUTH EVERY EVENING   triamcinolone cream (KENALOG) 0.1 % Apply 1 application topically 2 (two) times daily.   No facility-administered encounter medications on file as of  08/22/2021.  :   Review of Systems:  Out of a complete 14 point review of systems, all are reviewed and negative with the exception of these symptoms as listed below:   Review of Systems  Neurological:        Pt is here tremors . Pt states tremors are in left hand. Pt states tremors increase at night when he is sleep. Pt states he is having trouble with his RLS . Pt also has questions about his medication . Wife states patient  has full body tremors    Objective:  Neurological Exam  Physical Exam Physical Examination:   Vitals:   08/22/21 1442  BP: 130/81  Pulse: 81    General Examination: The patient is a very pleasant 56 y.o. male in no acute distress. He appears well-developed and well-nourished and well groomed.   HEENT: Normocephalic, atraumatic, pupils are equal, round and reactive to light, extraocular tracking is good without limitation to gaze excursion or nystagmus noted. Hearing is grossly intact. Face is symmetric with normal facial animation. Speech is clear with no dysarthria noted. There is no hypophonia. There is no lip, neck/head, jaw or voice tremor. Neck is supple with full range of passive and active motion. There are no carotid bruits on auscultation. Oropharynx exam reveals: moderate mouth dryness, adequate dental hygiene. Tongue protrudes centrally and palate elevates symmetrically.   Chest: Clear to auscultation without wheezing, rhonchi or crackles noted.  Heart: S1+S2+0, regular and normal without murmurs, rubs or gallops noted.   Abdomen: Soft, non-tender and non-distended.  Extremities: There is  pitting edema in the distal lower extremities bilaterally.   Skin: Warm and dry without trophic changes noted.   Musculoskeletal: exam reveals no obvious joint deformities, tenderness or joint swelling or erythema.   Neurologically:  Mental status: The patient is awake, alert and oriented in all 4 spheres. His immediate and remote memory, attention, language  skills and fund of knowledge are appropriate. There is no evidence of aphasia, agnosia, apraxia or anomia. Speech is clear with normal prosody and enunciation. Thought process is linear. Mood is normal and affect is normal.  Cranial nerves II - XII are as described above under HEENT exam.   On 08/22/2021: On Archimedes spiral drawing he has coarse trembling with the left hand, no significant trembling with the right hand, handwriting is legible, not micrographic.  Motor exam: Normal bulk, strength and tone is noted. There is no resting tremor.  He has an intermittent left upper extremity postural tremor, no significant action tremor, no intention tremor, no right upper extremity tremor, no lower extremity tremor.  Fine motor skills with finger taps, hand movements and rapid alternating patting is fairly well-preserved, no decrement in amplitude, foot taps are normal bilaterally.  Cerebellar testing: No dysmetria or intention tremor. There is no truncal or gait ataxia.  Sensory exam: intact to light touch in the upper and lower extremities.  Gait, station and balance: He stands without difficulty and posture is age-appropriate.  He walks without difficulty, no shuffling, preserved arm swing, no evidence of tremor while walking.  Assessment and Plan:  In summary, Jesse Castillo is a very pleasant 56 y.o.-year old male ith an underlying medical history of COPD, neuropathy, restless leg syndrome, sleep apnea, BPH, and severe obesity with a BMI of over 45, who presents for evaluation of his hand tremor affecting the left hand for the past at least 20 years.  He has intermittently tried low-dose propranolol.  We talked about tremor triggers.  He is reassured that there is no evidence of parkinsonism.  He was given written instructions today.  We mutually agreed to try him on propranolol again.  He is advised that there is not a whole lot of symptomatic treatment options for tremors, certain medications such as  antidepressant medications and COPD/asthma medications can sometimes exacerbate tremors.  We talked about the importance of staying well-hydrated and well rested.  He had a question about his restless leg medication.  He is advised to keep follow-up  with his sleep specialist as far as his sleep apnea and restless legs is concerned.  He is advised to talk to you about side effects with regards to the Celexa, he has noted sexual side effects.  He is advised to consider propranolol with gradual titration, we will start with 20 mg once daily and titrate to twice daily.  He would like to avoid the midday dose if possible as he works.  He is advised to titrate to 20 mg twice daily and if need be we can then increase to 60 mg once daily long-acting thereafter.  He also reported at the end of the visit that he has had intermittent left ear pain including side of the ear as well as inside the ear canal.  He reported that you checked him for this.  He has not seen ENT and may benefit from seeing ENT.  He is advised to talk to about a referral potentially.  He is advised to follow-up to see one of our nurse practitioners for tremor follow-up routinely in about 3 to 4 months.  We can consider switching him to 60 mg of Inderal once daily in the interim.  He is advised about the possibility of having low heart rate, hypotension, exacerbation of wheezing or COPD symptoms potentially and also exacerbation of depressive symptoms potentially with beta-blocker treatment.  We are therefore being rather cautious with the dosing and titration.  I answered all the questions today and the patient and his wife were in agreement.   Thank you very much for allowing me to participate in the care of this nice patient. If I can be of any further assistance to you please do not hesitate to call me at [phone].  Sincerely,   Jesse Foley, MD, PhD

## 2021-08-22 NOTE — Patient Instructions (Signed)
You have an intermittent tremor of the left hand.  I do not see any signs or symptoms of parkinson's like disease or what we call parkinsonism.   For your tremor, I recommend a trial of Inderal (generic name: propranolol) 20 mg strength: take 1 pill each bedtime for 2 weeks, then 1 pill twice daily thereafter. Common side effect reported are: lethargy, sedation, low blood pressure and low pulse rate. Please monitor your BP and Pulse every few days, if your pulse drops lower than 55 you may feel bad and we may have to adjust your dose. Same with your BP below 110/55.  You can have worsening symptoms of wheezing and COPD, please be aware and also worsening depression with a beta blocker. Please be mindful of this.  If you would like to switch to a once daily long-acting formulation of the Inderal we can change it in the future to a 60 mg strength once daily.  Please remember, that any kind of tremor may be exacerbated by anxiety, anger, nervousness, excitement, dehydration, sleep deprivation, by caffeine, and low blood sugar values or blood sugar fluctuations. Some medications can exacerbate tremors, this includes asthma/COPD meds and certain antidepressants.   Please talk to your primary care about your left ear pain; you may benefit from seeing an ENT specialist.   Please talk to your primary care about your antidepressant and sexual side effects.   Follow up in this clinic to see one of our nurse practitioners in about 3-4 months.   For sleep related concerns, including your restless legs and sleep apnea, follow-up with your sleep specialist.

## 2021-08-23 ENCOUNTER — Telehealth: Payer: Self-pay | Admitting: Neurology

## 2021-08-23 ENCOUNTER — Other Ambulatory Visit: Payer: Self-pay | Admitting: Family Medicine

## 2021-08-23 DIAGNOSIS — R251 Tremor, unspecified: Secondary | ICD-10-CM

## 2021-08-23 DIAGNOSIS — K219 Gastro-esophageal reflux disease without esophagitis: Secondary | ICD-10-CM

## 2021-08-23 MED ORDER — PROPRANOLOL HCL 20 MG PO TABS: 20.0000 mg | ORAL_TABLET | Freq: Two times a day (BID) | ORAL | 5 refills | Status: DC

## 2021-08-23 NOTE — Telephone Encounter (Signed)
Spoke with pt and apologized for the delay on behalf of Dr Frances Furbish. Advised order has been sent to [facility]. Pt was appreciative.

## 2021-08-23 NOTE — Telephone Encounter (Signed)
Sorry about the delay, Rx for inderal generic placed.

## 2021-08-23 NOTE — Telephone Encounter (Signed)
Pt states that the [facility] informed him that they did not receive the Rx for his propranolol and pt is needing the medication. Pt would like the RN to call him and let him know when this is done.

## 2021-09-04 ENCOUNTER — Other Ambulatory Visit: Payer: Self-pay | Admitting: Family Medicine

## 2021-09-04 DIAGNOSIS — I10 Essential (primary) hypertension: Secondary | ICD-10-CM

## 2021-09-04 DIAGNOSIS — M1990 Unspecified osteoarthritis, unspecified site: Secondary | ICD-10-CM

## 2021-09-05 ENCOUNTER — Encounter: Payer: 59 | Admitting: Family Medicine

## 2021-09-06 ENCOUNTER — Encounter: Payer: Self-pay | Admitting: Family Medicine

## 2021-09-06 ENCOUNTER — Ambulatory Visit (INDEPENDENT_AMBULATORY_CARE_PROVIDER_SITE_OTHER): Payer: 59 | Admitting: Family Medicine

## 2021-09-06 ENCOUNTER — Other Ambulatory Visit: Payer: Self-pay | Admitting: Family Medicine

## 2021-09-06 DIAGNOSIS — H9202 Otalgia, left ear: Secondary | ICD-10-CM | POA: Diagnosis not present

## 2021-09-06 DIAGNOSIS — F419 Anxiety disorder, unspecified: Secondary | ICD-10-CM | POA: Diagnosis not present

## 2021-09-06 DIAGNOSIS — I10 Essential (primary) hypertension: Secondary | ICD-10-CM

## 2021-09-06 DIAGNOSIS — K219 Gastro-esophageal reflux disease without esophagitis: Secondary | ICD-10-CM

## 2021-09-06 DIAGNOSIS — G479 Sleep disorder, unspecified: Secondary | ICD-10-CM | POA: Diagnosis not present

## 2021-09-06 DIAGNOSIS — R69 Illness, unspecified: Secondary | ICD-10-CM | POA: Diagnosis not present

## 2021-09-06 NOTE — Progress Notes (Signed)
? ?Virtual Visit via Telephone Note ? ?I connected with Jesse Castillo on 09/06/21 at 8:56 AM by telephone and verified that I am speaking with the correct person using two identifiers. Jesse Castillo is currently located at home and his wife is currently with him during this visit. The provider, Gwenlyn Fudge, FNP is located in their office at time of visit. ? ?I discussed the limitations, risks, security and privacy concerns of performing an evaluation and management service by telephone and the availability of in person appointments. I also discussed with the patient that there may be a patient responsible charge related to this service. The patient expressed understanding and agreed to proceed. ? ?Subjective: ?PCP: Gwenlyn Fudge, FNP ? ?Chief Complaint  ?Patient presents with  ? Discuss medication  ? ?Patient reports he likes the hydroxyzine he is taking at bedtime as he feels it helps his anxiety and his sleep. He does not like the Celexa and would like to wean off of it as he reports in impedes with his sexual performance. ? ?He would like a referral to ENT due to left ear pain as he has been seen here and at urgent care and nobody has been able to visually see anything wrong with his ear.  ? ?He is also requesting a note for work that he does not have to wear hearing protection. 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 He is afraid he is going to run over someone.  ? ? ?ROS: Per HPI ? ?Current Outpatient Medications:  ?  ADVAIR DISKUS 250-50 MCG/ACT AEPB, INHALE ONE PUFF INTO THE LUNGS IN THE MORNING AND AT BEDTIME., Disp: 60 each, Rfl: 1 ?  albuterol (VENTOLIN HFA) 108 (90 Base) MCG/ACT inhaler, Inhale 2 puffs into the lungs every 6 (six) hours as needed for wheezing or shortness of breath., Disp: , Rfl:  ?  amLODipine (NORVASC) 5 MG tablet, TAKE ONE (1) TABLET BY MOUTH EVERY DAY, Disp: 30  tablet, Rfl: 4 ?  aspirin EC 81 MG tablet, Take 81 mg by mouth daily., Disp: , Rfl:  ?  citalopram (CELEXA) 20 MG tablet, Take 1 tablet (20 mg total) by mouth daily., Disp: 30 tablet, Rfl: 2 ?  diclofenac (VOLTAREN) 75 MG EC tablet, TAKE ONE TABLET (75MG  TOTAL) BY MOUTH TWO TIMES DAILY, Disp: 180 tablet, Rfl: 0 ?  famotidine (PEPCID) 20 MG tablet, Take 3 tablets (60 mg total) by mouth at bedtime., Disp: 270 tablet, Rfl: 1 ?  fluticasone (FLONASE) 50 MCG/ACT nasal spray, Place 2 sprays into both nostrils daily., Disp: 16 g, Rfl: 6 ?  hydrochlorothiazide (HYDRODIURIL) 50 MG tablet, Take 1 tablet (50 mg total) by mouth daily., Disp: 90 tablet, Rfl: 1 ?  hydrOXYzine (VISTARIL) 25 MG capsule, Take 1 capsule (25 mg total) by mouth at bedtime as needed for anxiety., Disp: 30 capsule, Rfl: 2 ?  levocetirizine (XYZAL) 5 MG tablet, TAKE 1 TABLET BY MOUTH EVERY EVENING, Disp: 30 tablet, Rfl: 5 ?  Misc Natural Products (URINOZINC PLUS PO), Take by mouth., Disp: , Rfl:  ?  Multiple Vitamins-Minerals (MULTIVITAMIN MEN PO), Take by mouth., Disp: , Rfl:  ?  NEEDLE, DISP, 23 G (BD DISP NEEDLE) 23G X 1" MISC, Use every 14 days with testosterone, Disp: 50 each, Rfl: 1 ?  pantoprazole (PROTONIX) 40 MG tablet, TAKE ONE TABLET (40MG   TOTAL) BY MOUTH DAILY, Disp: 90 tablet, Rfl: 1 ?  Probiotic Product (HEALTHY COLON PO), Take by mouth., Disp: , Rfl:  ?  propranolol (INDERAL) 20 MG tablet, Take 1 tablet (20 mg total) by mouth 2 (two) times daily. Follow titration instructions provided separately., Disp: 60 tablet, Rfl: 5 ?  rOPINIRole (REQUIP) 4 MG tablet, TAKE ONE TABLET (4MG  TOTAL) BY MOUTH AT BEDTIME, Disp: 90 tablet, Rfl: 1 ?  sildenafil (VIAGRA) 100 MG tablet, Take 1 tablet (100 mg total) by mouth daily as needed., Disp: 30 tablet, Rfl: 2 ?  Specialty Vitamins Products (ECHINACEA C COMPLETE PO), Take by mouth., Disp: , Rfl:  ?  Syringe/Needle, Disp, 18G X 1" 3 ML MISC, 1 Device by Does not apply route every 14 (fourteen) days., Disp:  12 each, Rfl: 1 ?  tadalafil (CIALIS) 5 MG tablet, Take 1 tablet (5 mg total) by mouth daily as needed for erectile dysfunction., Disp: 30 tablet, Rfl: 11 ?  testosterone cypionate (DEPOTESTOSTERONE CYPIONATE) 200 MG/ML injection, Inject 0.5 mLs (100 mg total) into the muscle once a week., Disp: 10 mL, Rfl: 1 ?  triamcinolone cream (KENALOG) 0.1 %, Apply 1 application topically 2 (two) times daily., Disp: 80 g, Rfl: 1 ? ?Allergies  ?Allergen Reactions  ? Mobic [Meloxicam] Other (See Comments)  ?  Mouth sores  ? ?Past Medical History:  ?Diagnosis Date  ? Enlarged prostate   ? Neuropathy   ? RLE  ? Restless leg   ? Sleep apnea   ? ? ?Observations/Objective: ?A&O  ?No respiratory distress or wheezing audible over the phone ?Mood, judgement, and thought processes all WNL ? ?Assessment and Plan: ?1. Difficulty sleeping ?Well controlled on current regimen.  ? ?2. Anxiety ?Wean off - decrease Celexa to every other day x2 weeks, then stop. ? ?3. Left ear pain ?- Ambulatory referral to ENT ? ? ?Note provided for work regarding hearing protection. ? ? ?Follow Up Instructions: ? ?I discussed the assessment and treatment plan with the patient. The patient was provided an opportunity to ask questions and all were answered. The patient agreed with the plan and demonstrated an understanding of the instructions. ?  ?The patient was advised to call back or seek an in-person evaluation if the symptoms worsen or if the condition fails to improve as anticipated. ? ?The above assessment and management plan was discussed with the patient. The patient verbalized understanding of and has agreed to the management plan. Patient is aware to call the clinic if symptoms persist or worsen. Patient is aware when to return to the clinic for a follow-up visit. Patient educated on when it is appropriate to go to the emergency department.  ? ?Time call ended: 9:07 AM ? ?I provided 11 minutes of non-face-to-face time during this encounter. ? ? , MSN, APRN, FNP-C ?[facility] ?09/06/21 ? ? ?

## 2021-09-28 IMAGING — DX DG CHEST 2V
2 series · 2 of 2 positions shown · non-contrast
Comparison: None.

CLINICAL DATA: Shortness of breath.  Positive home COVID test.

EXAM:
CHEST - 2 VIEW

[chest pa]
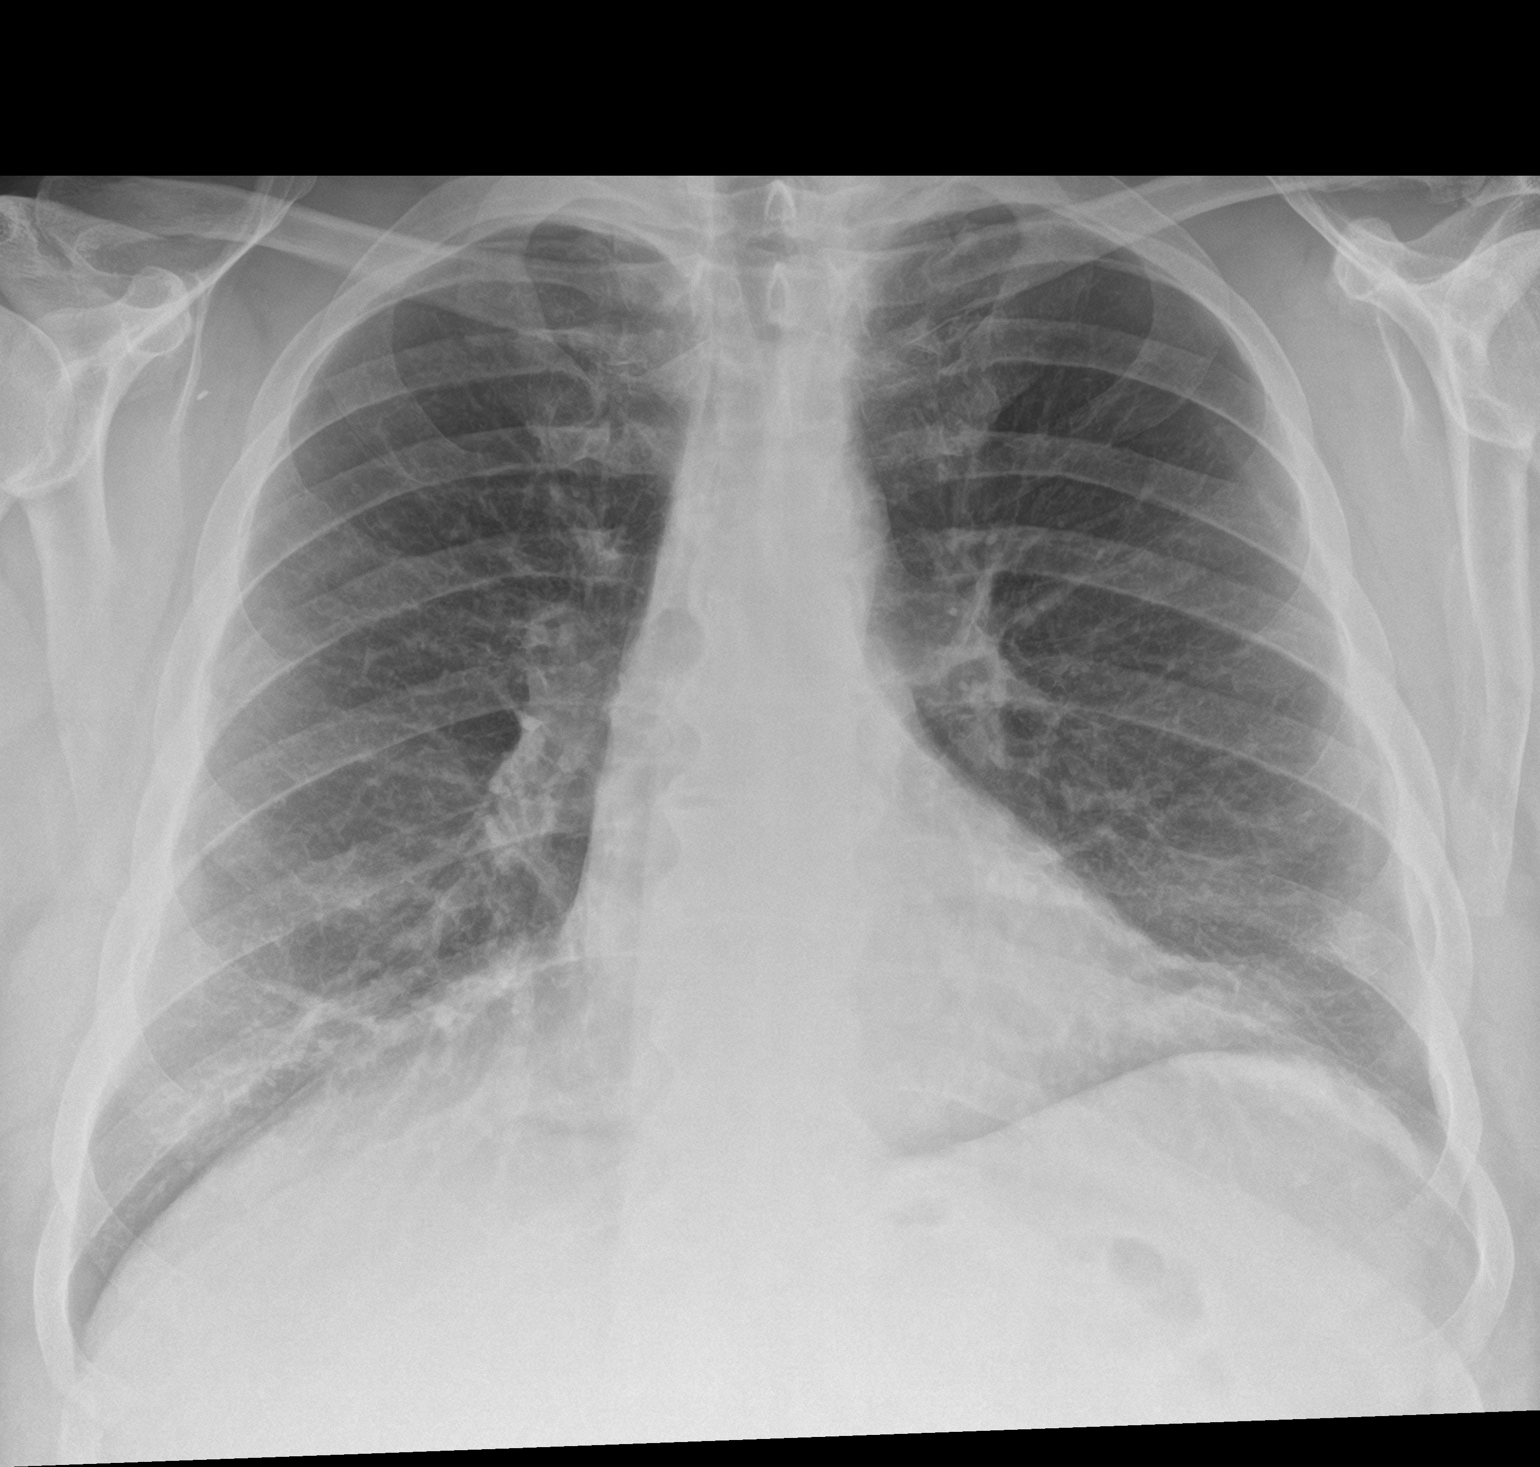

[chest lat]
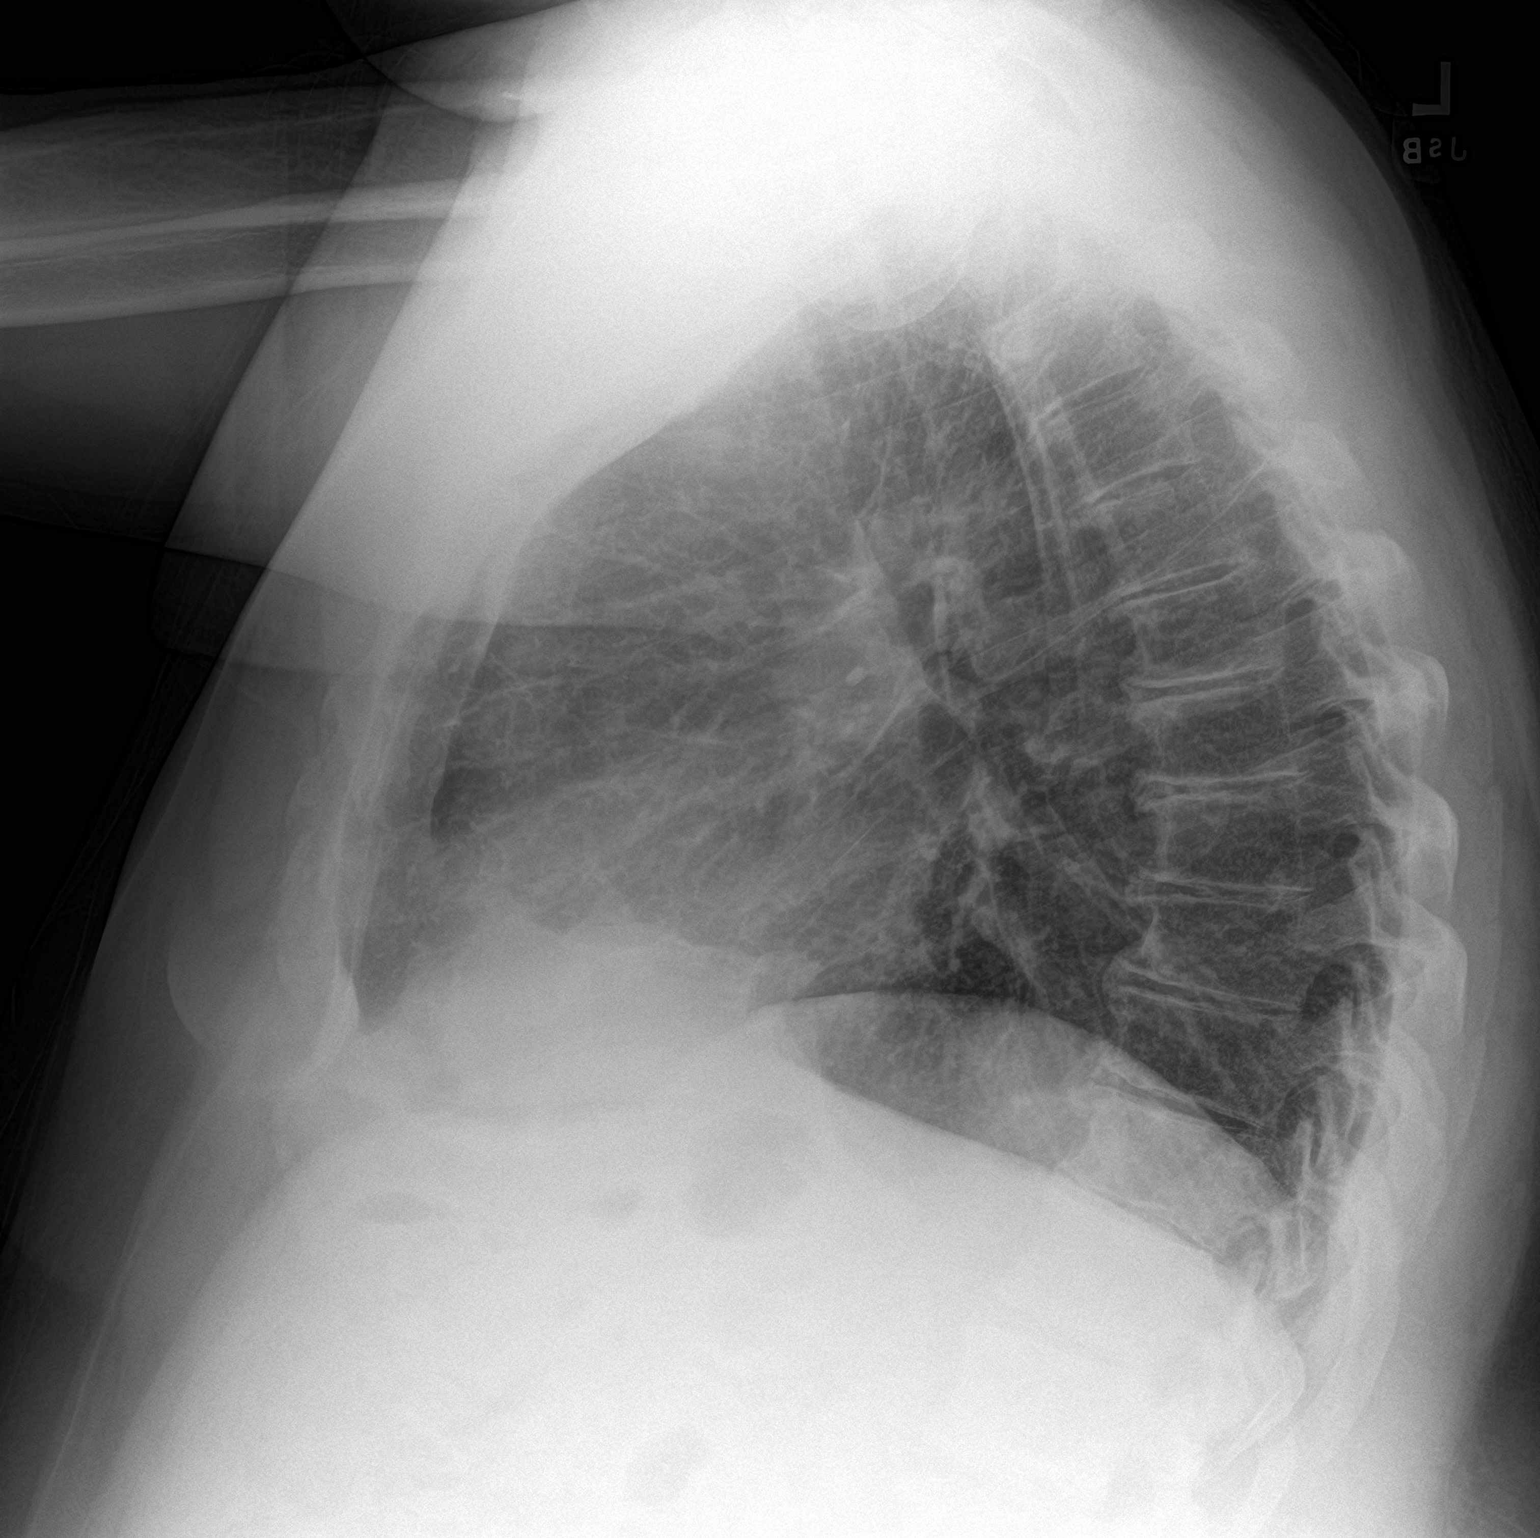

[2 of 2 positions shown; findings below may reference images not displayed]

FINDINGS: Patchy ground-glass opacities are seen in both lower lungs. The
cardiopericardial silhouette is within normal limits for size. No
pleural effusion. The visualized bony structures of the thorax show
no acute abnormality.
IMPRESSION: Patchy bibasilar ground-glass opacity. Imaging features compatible
with multifocal pneumonia.

## 2021-10-25 ENCOUNTER — Encounter: Payer: Self-pay | Admitting: Pulmonary Disease

## 2021-10-25 ENCOUNTER — Ambulatory Visit: Payer: 59 | Admitting: Pulmonary Disease

## 2021-10-25 VITALS — BP 142/98 | HR 78 | Temp 98.7°F | Ht 71.0 in | Wt 334.2 lb

## 2021-10-25 DIAGNOSIS — G4733 Obstructive sleep apnea (adult) (pediatric): Secondary | ICD-10-CM | POA: Diagnosis not present

## 2021-10-25 DIAGNOSIS — F5105 Insomnia due to other mental disorder: Secondary | ICD-10-CM | POA: Diagnosis not present

## 2021-10-25 DIAGNOSIS — R69 Illness, unspecified: Secondary | ICD-10-CM | POA: Diagnosis not present

## 2021-10-25 DIAGNOSIS — F418 Other specified anxiety disorders: Secondary | ICD-10-CM

## 2021-10-25 MED ORDER — ZOLPIDEM TARTRATE 5 MG PO TABS: 5.0000 mg | ORAL_TABLET | Freq: Every evening | ORAL | 5 refills | Status: DC | PRN

## 2021-10-25 NOTE — Progress Notes (Signed)
? ?[facility] ? ?Chief Complaint  ?Patient presents with  ? Follow-up  ?  CPAP working well.  ?Needs a new DME  ?Patient having trouble going to sleep   ? ? ? ?Constitutional:  ?BP (!) 142/98 (BP Location: Left Arm, Patient Position: Sitting)   Pulse 78   Temp 98.7 ?F (37.1 ?C) (Temporal)   Ht 5\' 11"  (1.803 m)   Wt (!) 334 lb 3.2 oz (151.6 kg)   SpO2 97% Comment: ra  BMI 46.61 kg/m?  ? ?Past Medical History:  ?BPH, GERD, HTN, ED, Low T, COVID Pneumonia January 2022 ? ?Past Surgical History:  ?He  has a past surgical history that includes Hernia repair. ? ?Brief Summary:  ?Jesse Castillo is a 56 y.o. male former smoker with obstructive sleep apnea and restless leg syndrome.  He has DOT license. ?  ? ? ? ?Subjective:  ? ?He is here with his wife. ? ?Feels CPAP helps.  He would like pressure to start at higher level.  No issue with mask fit. ? ?He still has trouble falling asleep.  He has trouble with anxiety and feels depressed at times.  He has a fear of going to sleep, and is worried he will die in his sleep.  Once he is asleep, he can sleep good.   ? ?Physical Exam:  ? ?Appearance - well kempt  ? ?ENMT - no sinus tenderness, no oral exudate, no LAN, Mallampati 3 airway, no stridor ? ?Respiratory - equal breath sounds bilaterally, no wheezing or rales ? ?CV - s1s2 regular rate and rhythm, no murmurs ? ?Ext - no clubbing, no edema ? ?Skin - no rashes ? ?Psych - normal mood and affect ? ?  ?Sleep Tests:  ?HST 07/27/20 >> AHI AHI 20, SpO2 low 86%. ?Auto CPAP 09/24/21 to 10/23/21 >> used on 30 of 30 nights with average 7 hrs 24 min.  Average AHI 6.7 with median CPAP 11 and 95 th percentile CPAP 14 cm H2O ? ?Social History:  ?He  reports that he quit smoking about 14 years ago. His smoking use included cigarettes. He has a 54.00 pack-year smoking history. He has never used smokeless tobacco. He reports that he does not drink alcohol and does not use drugs. ? ?Family History:   ?His family history includes Heart disease in his maternal grandmother and mother; Hypertension in his daughter; Parkinson's disease in his paternal grandmother; Tremor in his father. ?  ? ? ?Assessment/Plan:  ? ?Obstructive sleep apnea. ?- he is compliant with therapy and reports benefit from CPAP ?- he uses [employer] for his DME ?- will have his CPAP setting changed to 12 - 20 cm H2O ? ?Insomnia with anxiety and depression. ?- will arrange for referral to behavioral health ?- explained his sleep issues will not improve unless he has improvement in anxiety and depression ?- will have him use ambien 5 mg qhs for now ?- discussed stimulus control, relaxation techniques ? ?Restless leg syndrome. ?- f/u with PCP ? ?Hand tremor. ?- followed by Dr. 10/25/21 with [facility] ? ?Obesity. ?- he is aware of how his weight can impact his health ? ?Time Spent Involved in Patient Care on Day of Examination:  ?36 minutes ? ?Follow up:  ? ?Patient Instructions  ?Zolpidem (ambien) 5 mg pill nightly as needed to help with sleep ? ?Will arrange for referral to specialist in behavioral health  ? ?Will see if we can set up a  new medical supply company for CPAP supplies ? ?Follow up in 4 months ? ?Medication List:  ? ?Allergies as of 10/25/2021   ? ?   Reactions  ? Mobic [meloxicam] Other (See Comments)  ? Mouth sores  ? ?  ? ?  ?Medication List  ?  ? ?  ? Accurate as of October 25, 2021  2:37 PM. If you have any questions, ask your nurse or doctor.  ?  ?  ? ?  ? ?STOP taking these medications   ? ?Advair Diskus 250-50 MCG/ACT Aepb ?Generic drug: fluticasone-salmeterol ?Stopped by: Coralyn Helling, MD ?  ?albuterol 108 (90 Base) MCG/ACT inhaler ?Commonly known as: VENTOLIN HFA ?Stopped by: Coralyn Helling, MD ?  ?hydrOXYzine 25 MG capsule ?Commonly known as: VISTARIL ?Stopped by: Coralyn Helling, MD ?  ?triamcinolone cream 0.1 % ?Commonly known as: KENALOG ?Stopped by: Coralyn Helling, MD ?  ? ?  ? ?TAKE these medications   ? ?amLODipine 5 MG  tablet ?Commonly known as: NORVASC ?TAKE ONE (1) TABLET BY MOUTH EVERY DAY ?  ?aspirin EC 81 MG tablet ?Take 81 mg by mouth daily. ?  ?BD Disp Needle 23G X 1" Misc ?Generic drug: NEEDLE (DISP) 23 G ?Use every 14 days with testosterone ?  ?citalopram 20 MG tablet ?Commonly known as: CeleXA ?Take 1 tablet (20 mg total) by mouth daily. ?  ?diclofenac 75 MG EC tablet ?Commonly known as: VOLTAREN ?TAKE ONE TABLET (75MG  TOTAL) BY MOUTH TWO TIMES DAILY ?  ?ECHINACEA C COMPLETE PO ?Take by mouth. ?  ?famotidine 20 MG tablet ?Commonly known as: PEPCID ?TAKE THREE TABLETS BY MOUTH EVERY NIGHT AT BEDTIME ?  ?fluticasone 50 MCG/ACT nasal spray ?Commonly known as: FLONASE ?Place 2 sprays into both nostrils daily. ?  ?HEALTHY COLON PO ?Take by mouth. ?  ?hydrochlorothiazide 50 MG tablet ?Commonly known as: HYDRODIURIL ?TAKE ONE TABLET (50MG  TOTAL) BY MOUTH DAILY ?  ?levocetirizine 5 MG tablet ?Commonly known as: XYZAL ?TAKE 1 TABLET BY MOUTH EVERY EVENING ?  ?MULTIVITAMIN MEN PO ?Take by mouth. ?  ?pantoprazole 40 MG tablet ?Commonly known as: PROTONIX ?TAKE ONE TABLET (40MG  TOTAL) BY MOUTH DAILY ?  ?propranolol 20 MG tablet ?Commonly known as: INDERAL ?Take 1 tablet (20 mg total) by mouth 2 (two) times daily. Follow titration instructions provided separately. ?  ?rOPINIRole 4 MG tablet ?Commonly known as: REQUIP ?TAKE ONE TABLET (4MG  TOTAL) BY MOUTH AT BEDTIME ?  ?sildenafil 100 MG tablet ?Commonly known as: VIAGRA ?Take 1 tablet (100 mg total) by mouth daily as needed. ?  ?Syringe/Needle (Disp) 18G X 1" 3 ML Misc ?1 Device by Does not apply route every 14 (fourteen) days. ?  ?tadalafil 5 MG tablet ?Commonly known as: CIALIS ?Take 1 tablet (5 mg total) by mouth daily as needed for erectile dysfunction. ?  ?testosterone cypionate 200 MG/ML injection ?Commonly known as: DEPOTESTOSTERONE CYPIONATE ?Inject 0.5 mLs (100 mg total) into the muscle once a week. ?  ?URINOZINC PLUS PO ?Take by mouth. ?  ?zolpidem 5 MG tablet ?Commonly  known as: AMBIEN ?Take 1 tablet (5 mg total) by mouth at bedtime as needed for sleep. ?Started by: , MD ?  ? ?  ? ? ?Signature:  ? , MD ?[facility] ?Pager - [phone] ?10/25/2021, 2:37 PM ?  ? ? ? ? ? ? ? ? ?

## 2021-10-25 NOTE — Patient Instructions (Signed)
Zolpidem (ambien) 5 mg pill nightly as needed to help with sleep ? ?Will arrange for referral to specialist in behavioral health  ? ?Will see if we can set up a new medical supply company for CPAP supplies ? ?Follow up in 4 months ?

## 2021-11-04 ENCOUNTER — Other Ambulatory Visit: Payer: Self-pay | Admitting: Family Medicine

## 2021-11-04 DIAGNOSIS — F419 Anxiety disorder, unspecified: Secondary | ICD-10-CM

## 2021-11-20 ENCOUNTER — Other Ambulatory Visit: Payer: Self-pay | Admitting: Family Medicine

## 2021-11-20 DIAGNOSIS — F419 Anxiety disorder, unspecified: Secondary | ICD-10-CM

## 2021-11-21 ENCOUNTER — Encounter: Payer: Self-pay | Admitting: Family Medicine

## 2021-11-21 ENCOUNTER — Ambulatory Visit (INDEPENDENT_AMBULATORY_CARE_PROVIDER_SITE_OTHER): Payer: 59 | Admitting: Family Medicine

## 2021-11-21 VITALS — BP 127/83 | HR 82 | Temp 98.3°F | Ht 71.0 in | Wt 326.2 lb

## 2021-11-21 DIAGNOSIS — I10 Essential (primary) hypertension: Secondary | ICD-10-CM | POA: Diagnosis not present

## 2021-11-21 DIAGNOSIS — Z0001 Encounter for general adult medical examination with abnormal findings: Secondary | ICD-10-CM

## 2021-11-21 DIAGNOSIS — R053 Chronic cough: Secondary | ICD-10-CM

## 2021-11-21 DIAGNOSIS — J302 Other seasonal allergic rhinitis: Secondary | ICD-10-CM | POA: Diagnosis not present

## 2021-11-21 DIAGNOSIS — Z0000 Encounter for general adult medical examination without abnormal findings: Secondary | ICD-10-CM

## 2021-11-21 DIAGNOSIS — K219 Gastro-esophageal reflux disease without esophagitis: Secondary | ICD-10-CM | POA: Diagnosis not present

## 2021-11-21 DIAGNOSIS — L600 Ingrowing nail: Secondary | ICD-10-CM | POA: Diagnosis not present

## 2021-11-21 DIAGNOSIS — M1990 Unspecified osteoarthritis, unspecified site: Secondary | ICD-10-CM

## 2021-11-21 MED ORDER — FAMOTIDINE 20 MG PO TABS: ORAL_TABLET | ORAL | 1 refills | Status: DC

## 2021-11-21 MED ORDER — DICLOFENAC SODIUM 75 MG PO TBEC: DELAYED_RELEASE_TABLET | ORAL | 1 refills | Status: DC

## 2021-11-21 MED ORDER — LEVOCETIRIZINE DIHYDROCHLORIDE 5 MG PO TABS: 5.0000 mg | ORAL_TABLET | Freq: Every evening | ORAL | 1 refills | Status: DC

## 2021-11-21 MED ORDER — SULFAMETHOXAZOLE-TRIMETHOPRIM 800-160 MG PO TABS: 1.0000 | ORAL_TABLET | Freq: Two times a day (BID) | ORAL | 0 refills | Status: AC
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
## 2021-11-22 LAB — CMP14+EGFR
ALT: 30 IU/L (ref 0–44)
AST: 25 IU/L (ref 0–40)
Albumin/Globulin Ratio: 1.7 (ref 1.2–2.2)
Albumin: 4.5 g/dL (ref 3.8–4.9)
Alkaline Phosphatase: 64 IU/L (ref 44–121)
BUN/Creatinine Ratio: 15 (ref 9–20)
BUN: 16 mg/dL (ref 6–24)
Bilirubin Total: 0.5 mg/dL (ref 0.0–1.2)
CO2: 23 mmol/L (ref 20–29)
Calcium: 9.8 mg/dL (ref 8.7–10.2)
Chloride: 105 mmol/L (ref 96–106)
Creatinine, Ser: 1.07 mg/dL (ref 0.76–1.27)
Globulin, Total: 2.7 g/dL (ref 1.5–4.5)
Glucose: 79 mg/dL (ref 70–99)
Potassium: 4.1 mmol/L (ref 3.5–5.2)
Sodium: 143 mmol/L (ref 134–144)
Total Protein: 7.2 g/dL (ref 6.0–8.5)
eGFR: 83 mL/min/{1.73_m2} (ref >59)

## 2021-11-22 LAB — CBC WITH DIFFERENTIAL/PLATELET
Basophils Absolute: 0 10*3/uL (ref 0.0–0.2)
Basos: 0 %
EOS (ABSOLUTE): 0.1 10*3/uL (ref 0.0–0.4)
Eos: 1 %
Hematocrit: 48.7 % (ref 37.5–51.0)
Hemoglobin: 16.8 g/dL (ref 13.0–17.7)
Immature Grans (Abs): 0 10*3/uL (ref 0.0–0.1)
Immature Granulocytes: 0 %
Lymphocytes Absolute: 2.2 10*3/uL (ref 0.7–3.1)
Lymphs: 24 %
MCH: 31.8 pg (ref 26.6–33.0)
MCHC: 34.5 g/dL (ref 31.5–35.7)
MCV: 92 fL (ref 79–97)
Monocytes Absolute: 1.4 10*3/uL — ABNORMAL HIGH (ref 0.1–0.9)
Monocytes: 15 %
Neutrophils Absolute: 5.4 10*3/uL (ref 1.4–7.0)
Neutrophils: 60 %
Platelets: 245 10*3/uL (ref 150–450)
RBC: 5.29 x10E6/uL (ref 4.14–5.80)
RDW: 13 % (ref 11.6–15.4)
WBC: 9.2 10*3/uL (ref 3.4–10.8)

## 2021-11-22 LAB — LIPID PANEL
Chol/HDL Ratio: 6.1 ratio — ABNORMAL HIGH (ref 0.0–5.0)
Cholesterol, Total: 178 mg/dL (ref 100–199)
HDL: 29 mg/dL — ABNORMAL LOW (ref >39)
LDL Chol Calc (NIH): 85 mg/dL (ref 0–99)
Triglycerides: 388 mg/dL — ABNORMAL HIGH (ref 0–149)
VLDL Cholesterol Cal: 64 mg/dL — ABNORMAL HIGH (ref 5–40)

## 2021-11-23 ENCOUNTER — Other Ambulatory Visit: Payer: Self-pay | Admitting: Family Medicine

## 2021-11-23 DIAGNOSIS — F419 Anxiety disorder, unspecified: Secondary | ICD-10-CM

## 2021-11-27 ENCOUNTER — Encounter: Payer: Self-pay | Admitting: Family Medicine

## 2021-12-04 ENCOUNTER — Telehealth: Payer: Self-pay

## 2021-12-04 NOTE — Telephone Encounter (Signed)
Pantoprazole Sodium 40MG  dr tablets   Key: - PA Case ID: [id number] - Rx #[id number]  Sent to plan    Outcome Approved today Your PA request has been approved. Additional information will be provided in the approval communication. (Message 1145)  Pharmacy aware

## 2021-12-13 ENCOUNTER — Other Ambulatory Visit: Payer: Self-pay | Admitting: Family Medicine

## 2021-12-13 DIAGNOSIS — I10 Essential (primary) hypertension: Secondary | ICD-10-CM

## 2021-12-20 ENCOUNTER — Ambulatory Visit: Payer: 59 | Admitting: Adult Health

## 2022-01-03 ENCOUNTER — Other Ambulatory Visit: Payer: 59

## 2022-01-03 DIAGNOSIS — N401 Enlarged prostate with lower urinary tract symptoms: Secondary | ICD-10-CM | POA: Diagnosis not present

## 2022-01-03 DIAGNOSIS — E291 Testicular hypofunction: Secondary | ICD-10-CM | POA: Diagnosis not present

## 2022-01-03 DIAGNOSIS — D751 Secondary polycythemia: Secondary | ICD-10-CM | POA: Diagnosis not present

## 2022-01-03 DIAGNOSIS — N138 Other obstructive and reflux uropathy: Secondary | ICD-10-CM | POA: Diagnosis not present

## 2022-01-04 LAB — HEMOGLOBIN AND HEMATOCRIT, BLOOD
Hematocrit: 50.4 % (ref 37.5–51.0)
Hemoglobin: 17.4 g/dL (ref 13.0–17.7)

## 2022-01-04 LAB — PSA, TOTAL AND FREE
PSA, Free Pct: 51.7 %
PSA, Free: 0.31 ng/mL
Prostate Specific Ag, Serum: 0.6 ng/mL (ref 0.0–4.0)

## 2022-01-04 LAB — TESTOSTERONE: Testosterone: 751 ng/dL (ref 264–916)

## 2022-01-11 ENCOUNTER — Ambulatory Visit: Payer: 59 | Admitting: Urology

## 2022-01-11 ENCOUNTER — Encounter: Payer: Self-pay | Admitting: Urology

## 2022-01-11 VITALS — BP 128/79 | HR 79

## 2022-01-11 DIAGNOSIS — R351 Nocturia: Secondary | ICD-10-CM | POA: Diagnosis not present

## 2022-01-11 DIAGNOSIS — N138 Other obstructive and reflux uropathy: Secondary | ICD-10-CM

## 2022-01-11 DIAGNOSIS — E291 Testicular hypofunction: Secondary | ICD-10-CM

## 2022-01-11 DIAGNOSIS — D751 Secondary polycythemia: Secondary | ICD-10-CM | POA: Diagnosis not present

## 2022-01-11 DIAGNOSIS — R7989 Other specified abnormal findings of blood chemistry: Secondary | ICD-10-CM

## 2022-01-11 DIAGNOSIS — N401 Enlarged prostate with lower urinary tract symptoms: Secondary | ICD-10-CM | POA: Diagnosis not present

## 2022-01-11 LAB — URINALYSIS, ROUTINE W REFLEX MICROSCOPIC
Bilirubin, UA: NEGATIVE
Glucose, UA: NEGATIVE
Leukocytes,UA: NEGATIVE
Nitrite, UA: NEGATIVE
RBC, UA: NEGATIVE
Specific Gravity, UA: 1.015 (ref 1.005–1.030)
Urobilinogen, Ur: 1 mg/dL (ref 0.2–1.0)
pH, UA: 6 (ref 5.0–7.5)

## 2022-01-11 MED ORDER — TESTOSTERONE CYPIONATE 200 MG/ML IM SOLN: 100.0000 mg | INTRAMUSCULAR | 1 refills | Status: DC

## 2022-01-11 NOTE — Progress Notes (Signed)
Subjective: 1. BPH with urinary obstruction   2. Hypogonadism in male   3. Low testosterone in male   4. Secondary polycythemia   5. Nocturia      Consult requested by Britney Joyce NP  Larren is a 56 yo male who is sent for a history of hypogonadism that is being managed with Testosterone cypionate 100mg q1wk.  His level is 751 at a peak.  His Hgb is stab1e at 17.4 and he has been donating blood regularly.  He has improved energy with the med.  His PSA was 0.8 on 02/08/21.  He has moderate LUTS with an IPSS of 9.  He can have some hesitancy but his IPSS is 8 with nocturia x 2.  His PSA is 0.6.  He has no prior stones, UTI's or GU surgery.   He has ED and is on sildenafil and  tadalafil with an improved response but he had decreased sensation difficulty with reaching a climax but that is better off of the Prozac. He doesn't have any problems with the injections.  He still has some issues with irritability. UA is clear.   ROS:  Review of Systems  HENT:  Positive for congestion.   Respiratory:  Positive for cough and shortness of breath.   Gastrointestinal:  Positive for heartburn.  Endo/Heme/Allergies:  Bruises/bleeds easily.  Psychiatric/Behavioral:  Positive for memory loss.   All other systems reviewed and are negative.   Allergies  Allergen Reactions   Mobic [Meloxicam] Other (See Comments)    Mouth sores    Past Medical History:  Diagnosis Date   Enlarged prostate    GERD (gastroesophageal reflux disease)    Hypertension    Neuropathy    RLE   Pneumonia due to COVID-19 virus 07/2020   Restless leg    Sleep apnea    Tremor of left hand     Past Surgical History:  Procedure Laterality Date   HERNIA REPAIR      Social History   Socioeconomic History   Marital status: Married    Spouse name: Not on file   Number of children: Not on file   Years of education: Not on file   Highest education level: Not on file  Occupational History   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 [employer]  Tobacco Use   Smoking status: Former    Packs/day: 3.00    Years: 18.00    Total pack years: 54.00    Types: Cigarettes    Quit date: 07/03/2007    Years since quitting: 14.5   Smokeless tobacco: Never  Vaping Use   Vaping Use: Never used  Substance and Sexual Activity   Alcohol use: Never   Drug use: Never   Sexual activity: Not on file  Other Topics Concern   Not on file  Social History Narrative   Not on file   Social Determinants of Health   Financial Resource Strain: Not on file  Food Insecurity: Not on file  Transportation Needs: Not on file  Physical Activity: Not on file  Stress: Not on file  Social Connections: Not on file  Intimate Partner Violence: Not on file    Family History  Problem Relation Age of Onset   Heart disease Mother    Tremor Father    Hypertension Daughter    Heart disease Maternal Grandmother    Parkinson's disease Paternal Grandmother     Anti-infectives: Anti-infectives (From admission, onward)    None         Current Outpatient Medications  Medication Sig Dispense Refill   amLODipine (NORVASC) 5 MG tablet TAKE ONE (1) TABLET BY MOUTH EVERY DAY 30 tablet 4   aspirin EC 81 MG tablet Take 81 mg by mouth daily.     diclofenac (VOLTAREN) 75 MG EC tablet TAKE ONE TABLET (75MG TOTAL) BY MOUTH TWO TIMES DAILY 180 tablet 1   famotidine (PEPCID) 20 MG tablet TAKE THREE TABLETS BY MOUTH EVERY NIGHT AT BEDTIME 270 tablet 1   fluticasone (FLONASE) 50 MCG/ACT nasal spray Place 2 sprays into both nostrils daily. 16 g 6   hydrochlorothiazide (HYDRODIURIL) 50 MG tablet TAKE ONE TABLET (50MG TOTAL) BY MOUTH DAILY 90 tablet 1   levocetirizine (XYZAL) 5 MG tablet Take 1 tablet (5 mg total) by mouth every evening. 90 tablet 1   Misc Natural Products (URINOZINC PLUS PO) Take by mouth.     Multiple Vitamins-Minerals (MULTIVITAMIN MEN PO) Take by mouth.     NEEDLE, DISP, 23 G (BD DISP NEEDLE) 23G X 1" MISC Use every 14 days with testosterone 50  each 1   pantoprazole (PROTONIX) 40 MG tablet TAKE ONE TABLET (40MG TOTAL) BY MOUTH DAILY 90 tablet 1   Probiotic Product (HEALTHY COLON PO) Take by mouth.     propranolol (INDERAL) 20 MG tablet Take 1 tablet (20 mg total) by mouth 2 (two) times daily. Follow titration instructions provided separately. 60 tablet 5   rOPINIRole (REQUIP) 4 MG tablet TAKE ONE TABLET (4MG TOTAL) BY MOUTH AT BEDTIME 90 tablet 1   sildenafil (VIAGRA) 100 MG tablet Take 1 tablet (100 mg total) by mouth daily as needed. 30 tablet 2   Specialty Vitamins Products (ECHINACEA C COMPLETE PO) Take by mouth.     Syringe/Needle, Disp, 18G X 1" 3 ML MISC 1 Device by Does not apply route every 14 (fourteen) days. 12 each 1   tadalafil (CIALIS) 5 MG tablet Take 1 tablet (5 mg total) by mouth daily as needed for erectile dysfunction. 30 tablet 11   testosterone cypionate (DEPOTESTOSTERONE CYPIONATE) 200 MG/ML injection Inject 0.5 mLs (100 mg total) into the muscle once a week. 10 mL 1   zolpidem (AMBIEN) 5 MG tablet Take 1 tablet (5 mg total) by mouth at bedtime as needed for sleep. 30 tablet 5   No current facility-administered medications for this visit.     Objective: Vital signs in last 24 hours: BP 128/79   Pulse 79   Intake/Output from previous day: No intake/output data recorded. Intake/Output this shift: @IOTHISSHIFT@   Physical Exam Vitals reviewed.  Constitutional:      Appearance: Normal appearance. He is obese.  Neurological:     Mental Status: He is alert.     Lab Results:  Recent Results (from the past 2160 hour(s))  CBC with Differential/Platelet     Status: Abnormal   Collection Time: 11/21/21  2:40 PM  Result Value Ref Range   WBC 9.2 3.4 - 10.8 x10E3/uL   RBC 5.29 4.14 - 5.80 x10E6/uL   Hemoglobin 16.8 13.0 - 17.7 g/dL   Hematocrit 48.7 37.5 - 51.0 %   MCV 92 79 - 97 fL   MCH 31.8 26.6 - 33.0 pg   MCHC 34.5 31.5 - 35.7 g/dL   RDW 13.0 11.6 - 15.4 %   Platelets 245 150 - 450 x10E3/uL    Neutrophils 60 Not Estab. %   Lymphs 24 Not Estab. %   Monocytes 15 Not Estab. %   Eos 1 Not Estab. %     Basos 0 Not Estab. %   Neutrophils Absolute 5.4 1.4 - 7.0 x10E3/uL   Lymphocytes Absolute 2.2 0.7 - 3.1 x10E3/uL   Monocytes Absolute 1.4 (H) 0.1 - 0.9 x10E3/uL   EOS (ABSOLUTE) 0.1 0.0 - 0.4 x10E3/uL   Basophils Absolute 0.0 0.0 - 0.2 x10E3/uL   Immature Granulocytes 0 Not Estab. %   Immature Grans (Abs) 0.0 0.0 - 0.1 x10E3/uL  CMP14+EGFR     Status: None   Collection Time: 11/21/21  2:40 PM  Result Value Ref Range   Glucose 79 70 - 99 mg/dL   BUN 16 6 - 24 mg/dL   Creatinine, Ser 1.07 0.76 - 1.27 mg/dL   eGFR 83 >59 mL/min/1.73   BUN/Creatinine Ratio 15 9 - 20   Sodium 143 134 - 144 mmol/L   Potassium 4.1 3.5 - 5.2 mmol/L   Chloride 105 96 - 106 mmol/L   CO2 23 20 - 29 mmol/L   Calcium 9.8 8.7 - 10.2 mg/dL   Total Protein 7.2 6.0 - 8.5 g/dL   Albumin 4.5 3.8 - 4.9 g/dL   Globulin, Total 2.7 1.5 - 4.5 g/dL   Albumin/Globulin Ratio 1.7 1.2 - 2.2   Bilirubin Total 0.5 0.0 - 1.2 mg/dL   Alkaline Phosphatase 64 44 - 121 IU/L   AST 25 0 - 40 IU/L   ALT 30 0 - 44 IU/L  Lipid panel     Status: Abnormal   Collection Time: 11/21/21  2:40 PM  Result Value Ref Range   Cholesterol, Total 178 100 - 199 mg/dL   Triglycerides 388 (H) 0 - 149 mg/dL   HDL 29 (L) >39 mg/dL   VLDL Cholesterol Cal 64 (H) 5 - 40 mg/dL   LDL Chol Calc (NIH) 85 0 - 99 mg/dL   Chol/HDL Ratio 6.1 (H) 0.0 - 5.0 ratio    Comment:                                   T. Chol/HDL Ratio                                             Men  Women                               1/2 Avg.Risk  3.4    3.3                                   Avg.Risk  5.0    4.4                                2X Avg.Risk  9.6    7.1                                3X Avg.Risk 23.4   11.0   PSA, total and free     Status: None   Collection Time: 01/03/22 10:02 AM  Result Value Ref Range   Prostate Specific Ag, Serum 0.6 0.0 - 4.0 ng/mL     Comment: Roche  ECLIA methodology. According to the American Urological Association, Serum PSA should decrease and remain at undetectable levels after radical prostatectomy. The AUA defines biochemical recurrence as an initial PSA value 0.2 ng/mL or greater followed by a subsequent confirmatory PSA value 0.2 ng/mL or greater. Values obtained with different assay methods or kits cannot be used interchangeably. Results cannot be interpreted as absolute evidence of the presence or absence of malignant disease.    PSA, Free 0.31 N/A ng/mL    Comment: Roche ECLIA methodology.   PSA, Free Pct 51.7 %    Comment: The table below lists the probability of prostate cancer for men with non-suspicious DRE results and total PSA between 4 and 10 ng/mL, by patient age (Catalona et al, JAMA 1998, 279:1542).                   % Free PSA       50-64 yr        65-75 yr                   0.00-10.00%        56%             55%                  10.01-15.00%        24%             35%                  15.01-20.00%        17%             23%                  20.01-25.00%        10%             20%                       >25.00%         5%              9% Please note:  Catalona et al did not make specific               recommendations regarding the use of               percent free PSA for any other population               of men.   Testosterone     Status: None   Collection Time: 01/03/22 10:02 AM  Result Value Ref Range   Testosterone 751 264 - 916 ng/dL    Comment: Adult male reference interval is based on a population of healthy nonobese males (BMI <30) between 19 and 39 years old. Travison, et.al. JCEM 2017,102;1161-1173. PMID: 28324103.   Hemoglobin and hematocrit, blood     Status: None   Collection Time: 01/03/22 10:02 AM  Result Value Ref Range   Hemoglobin 17.4 13.0 - 17.7 g/dL   Hematocrit 50.4 37.5 - 51.0 %  Urinalysis, Routine w reflex microscopic     Status: Abnormal   Collection Time:  01/11/22  9:13 AM  Result Value Ref Range   Specific Gravity, UA 1.015 1.005 - 1.030   pH, UA 6.0 5.0 - 7.5   Color, UA Yellow Yellow   Appearance Ur Clear Clear   Leukocytes,UA Negative Negative   Protein,UA   Trace (A) Negative/Trace   Glucose, UA Negative Negative   Ketones, UA Trace (A) Negative   RBC, UA Negative Negative   Bilirubin, UA Negative Negative   Urobilinogen, Ur 1.0 0.2 - 1.0 mg/dL   Nitrite, UA Negative Negative   Microscopic Examination Comment     Comment: Microscopic follows if indicated.     BMET No results for input(s): "NA", "K", "CL", "CO2", "GLUCOSE", "BUN", "CREATININE", "CALCIUM" in the last 72 hours. PT/INR No results for input(s): "LABPROT", "INR" in the last 72 hours. ABG No results for input(s): "PHART", "HCO3" in the last 72 hours.  Invalid input(s): "PCO2", "PO2"  Studies/Results: No results found.   Assessment/Plan: Hypogonadism.  He is doing well on current therapy and his med was refilled   Polycythemia.  His Hgb is stable.  He will continue blood donation.    ED.  He is responding to the combination of daily tadalafil and sildenafil prn but has reduced sensation and delayed ejaculation. His scripts are current.    BPH with urgency.  This has improved with the tadalafil.     Meds ordered this encounter  Medications   testosterone cypionate (DEPOTESTOSTERONE CYPIONATE) 200 MG/ML injection    Sig: Inject 0.5 mLs (100 mg total) into the muscle once a week.    Dispense:  10 mL    Refill:  1      Orders Placed This Encounter  Procedures   Urinalysis, Routine w reflex microscopic   Testosterone    Standing Status:   Future    Standing Expiration Date:   01/12/2023   Hemoglobin and hematocrit, blood    Standing Status:   Future    Standing Expiration Date:   01/12/2023     Return in about 6 months (around 07/14/2022) for with labs. .    CC: Britney Joyce FNP     John Wrenn 01/11/2022 [phone] Patient ID: Jolon J  Schlarb, male   DOB: 06/23/1966, 55 y.o.   MRN: [id number]  

## 2022-02-02 ENCOUNTER — Other Ambulatory Visit: Payer: Self-pay | Admitting: Family Medicine

## 2022-02-02 DIAGNOSIS — G2581 Restless legs syndrome: Secondary | ICD-10-CM

## 2022-02-06 ENCOUNTER — Encounter: Payer: Self-pay | Admitting: Pulmonary Disease

## 2022-02-06 ENCOUNTER — Ambulatory Visit: Payer: 59 | Admitting: Pulmonary Disease

## 2022-02-06 VITALS — BP 136/82 | HR 68 | Temp 98.4°F | Ht 72.0 in | Wt 326.0 lb

## 2022-02-06 DIAGNOSIS — G4730 Sleep apnea, unspecified: Secondary | ICD-10-CM | POA: Diagnosis not present

## 2022-02-06 DIAGNOSIS — F5105 Insomnia due to other mental disorder: Secondary | ICD-10-CM | POA: Diagnosis not present

## 2022-02-06 DIAGNOSIS — F418 Other specified anxiety disorders: Secondary | ICD-10-CM

## 2022-02-06 DIAGNOSIS — E669 Obesity, unspecified: Secondary | ICD-10-CM | POA: Diagnosis not present

## 2022-02-06 DIAGNOSIS — G4733 Obstructive sleep apnea (adult) (pediatric): Secondary | ICD-10-CM | POA: Diagnosis not present

## 2022-02-06 DIAGNOSIS — R69 Illness, unspecified: Secondary | ICD-10-CM | POA: Diagnosis not present

## 2022-02-06 MED ORDER — MIRTAZAPINE 15 MG PO TABS: 15.0000 mg | ORAL_TABLET | Freq: Every day | ORAL | 5 refills | Status: DC

## 2022-02-06 NOTE — Patient Instructions (Signed)
Remeron 15 mg pill nightly  Follow up in 4 months

## 2022-02-06 NOTE — Progress Notes (Signed)
[facility]  Chief Complaint  Patient presents with   Follow-up    Patient states cpap working well but still having trouble falling asleep    Constitutional:  BP 136/82 (BP Location: Left Arm, Patient Position: Sitting)   Pulse 68   Temp 98.4 F (36.9 C) (Temporal)   Ht 6' (1.829 m)   Wt (!) 326 lb (147.9 kg)   SpO2 96% Comment: ra  BMI 44.21 kg/m   Past Medical History:  BPH, GERD, HTN, ED, Low T, COVID Pneumonia January 2022  Past Surgical History:  He  has a past surgical history that includes Hernia repair.  Brief Summary:  Jesse Castillo is a 56 y.o. male former smoker with obstructive sleep apnea and restless leg syndrome.  He has DOT license.      Subjective:   He is here with his wife.  CPAP working well.  He is sleeping better.  Still feels anxious about falling asleep, and that he won't be able to breath if he falls asleep.  Only happens if he falls asleep without CPAP.  He is working with a Paramedic.  He doesn't feel depressed.  Not using ambien much.  Physical Exam:   Appearance - well kempt   ENMT - no sinus tenderness, no oral exudate, no LAN, Mallampati 3 airway, no stridor  Respiratory - equal breath sounds bilaterally, no wheezing or rales  CV - s1s2 regular rate and rhythm, no murmurs  Ext - no clubbing, no edema  Skin - no rashes  Psych - normal mood and affect    Sleep Tests:  HST 07/27/20 >> AHI AHI 20, SpO2 low 86%. Auto CPAP 12/30/21 to 01/28/22 >> used on 30 of 30 nights with average 7 hrs 40 min.  Average AHI 3.9 with median CPAP 11 and 95 th percentile CPAP 13 cm H2O  Social History:  He  reports that he quit smoking about 14 years ago. His smoking use included cigarettes. He has a 54.00 pack-year smoking history. He has never used smokeless tobacco. He reports that he does not drink alcohol and does not use drugs.  Family History:  His family history includes Heart disease in his  maternal grandmother and mother; Hypertension in his daughter; Parkinson's disease in his paternal grandmother; Tremor in his father.     Assessment/Plan:   Obstructive sleep apnea. - he is compliant with therapy and reports benefit from CPAP - he uses [employer] for his DME - current CPAP ordered on 07/29/20 - continue auto CPAP 10 to 20 cm H2O - discussed how to adjust the ramp setting on his CPAP  Insomnia with anxiety and depression. - he is followed by behavioral health - will try adding remeron 15 mg nightly; he will call if he develops problems with impotence after starting remeron - prn ambien 5 mg  Restless leg syndrome. - f/u with PCP  Hand tremor. - followed by Dr. Huston Foley with [facility]  Obesity. - discussed how his weight can impact control of sleep apnea  Time Spent Involved in Patient Care on Day of Examination:  35 minutes  Follow up:   Patient Instructions  Remeron 15 mg pill nightly  Follow up in 4 months  Medication List:   Allergies as of 02/06/2022       Reactions   Mobic [meloxicam] Other (See Comments)   Mouth sores        Medication List  Accurate as of February 06, 2022 12:13 PM. If you have any questions, ask your nurse or doctor.          amLODipine 5 MG tablet Commonly known as: NORVASC TAKE ONE (1) TABLET BY MOUTH EVERY DAY   aspirin EC 81 MG tablet Take 81 mg by mouth daily.   BD Disp Needle 23G X 1" Misc Generic drug: NEEDLE (DISP) 23 G Use every 14 days with testosterone   diclofenac 75 MG EC tablet Commonly known as: VOLTAREN TAKE ONE TABLET (75MG  TOTAL) BY MOUTH TWO TIMES DAILY   ECHINACEA C COMPLETE PO Take by mouth.   famotidine 20 MG tablet Commonly known as: PEPCID TAKE THREE TABLETS BY MOUTH EVERY NIGHT AT BEDTIME   fluticasone 50 MCG/ACT nasal spray Commonly known as: FLONASE Place 2 sprays into both nostrils daily.   HEALTHY COLON PO Take by mouth.   hydrochlorothiazide 50 MG  tablet Commonly known as: HYDRODIURIL TAKE ONE TABLET (50MG  TOTAL) BY MOUTH DAILY   levocetirizine 5 MG tablet Commonly known as: XYZAL Take 1 tablet (5 mg total) by mouth every evening.   mirtazapine 15 MG tablet Commonly known as: REMERON Take 1 tablet (15 mg total) by mouth at bedtime. Started by: , MD   MULTIVITAMIN MEN PO Take by mouth.   pantoprazole 40 MG tablet Commonly known as: PROTONIX TAKE ONE TABLET (40MG  TOTAL) BY MOUTH DAILY   propranolol 20 MG tablet Commonly known as: INDERAL Take 1 tablet (20 mg total) by mouth 2 (two) times daily. Follow titration instructions provided separately.   rOPINIRole 4 MG tablet Commonly known as: REQUIP TAKE ONE TABLET (4MG  TOTAL) BY MOUTH AT BEDTIME   sildenafil 100 MG tablet Commonly known as: VIAGRA Take 1 tablet (100 mg total) by mouth daily as needed.   Syringe/Needle (Disp) 18G X 1" 3 ML Misc 1 Device by Does not apply route every 14 (fourteen) days.   tadalafil 5 MG tablet Commonly known as: CIALIS Take 1 tablet (5 mg total) by mouth daily as needed for erectile dysfunction.   testosterone cypionate 200 MG/ML injection Commonly known as: DEPOTESTOSTERONE CYPIONATE Inject 0.5 mLs (100 mg total) into the muscle once a week.   URINOZINC PLUS PO Take by mouth.   zolpidem 5 MG tablet Commonly known as: AMBIEN Take 1 tablet (5 mg total) by mouth at bedtime as needed for sleep.        Signature:  , MD [facility] Pager - [phone] 02/06/2022, 12:13 PM

## 2022-02-08 ENCOUNTER — Other Ambulatory Visit: Payer: Self-pay | Admitting: Family Medicine

## 2022-02-08 DIAGNOSIS — I10 Essential (primary) hypertension: Secondary | ICD-10-CM

## 2022-02-08 NOTE — Progress Notes (Signed)
[facility] [address] [place]. [place] [address] [phone]       OFFICE FOLLOW UP NOTE  Jesse Castillo Date of Birth:  03-26-1966 Medical Record Number:  [id number]    Primary neurologist: Dr. Frances Furbish Reason for visit: Intermittent tremor    SUBJECTIVE:   CHIEF COMPLAINT:  Chief Complaint  Patient presents with   Follow-up    Pt reports feeling better. He states he would like to speak with you about his restless leg. He states the Requip med is not giving him any relief. Room 2 with wife    HPI:   Update 02/13/2022 JM: Patient returns for follow-up visit after prior consult visit with Dr. Frances Furbish 6 months ago for left hand tremor.  He is accompanied by his wife. Per Dr. Frances Furbish, no concern/evidence of underlying parkinsonism.  Recommend initiating propranolol at prior visit currently taking 20 mg twice daily, tolerating without side effects. Per patient, believes tremors have improved some, but continues to interfere with use and function of left hand. Wife believes that tremor have worsened and believe one night while eating dinner, she noticed his head having a mild tremor. Patient does admit to worsening tremor with increased anxiety or stress.  He is concerned that his maternal grandmother had history of Parkinson's disease. Wife voices frustration of what is causing his tremors and that he is still suffering from them.   He also mentions ongoing issues with restless leg, currently on Requip 4 mg nightly but not helping.  This was also previously discussed with Dr. Frances Furbish who advised him to further discuss with his sleep provider. Per patient, was told my sleep provider that his apnea is being well treated and RLS is not due to his apnea. Reports recently giving blood and was told his "iron level was okay."  At times, can feel his whole body tremoring while sitting in the evening, will feel restless, he will need to get up and move.   He does have issues  sleeping at night, he feels anxious about falling asleep and feels like he may not be able to breathe if he falls asleep without his CPAP on. If his CPAP is on, he wont have this feeling. Was only started on mirtazapine by sleep provider.  He does have underlying anxiety and currently working with a therapist.   Towards the end of visit during exam, patient feels he has weakness in both his hands, will have difficulty holding or carrying heavy objects.  Denies any neck pain or radiculopathy symptoms.  Denies any hand numbness/tingling.  Prior PCP left office and is scheduled to establish care with new PCP on 9/6.     History provided for reference purposes only Consult visit 08/22/2021 Dr. Frances Furbish: [name] is a 56 year old right-handed gentleman with an underlying medical history of COPD, neuropathy, restless leg syndrome, sleep apnea, BPH, and severe obesity with a BMI of over 45, who reports a longstanding history of a left hand tremor of over 20 years.  He has previously been treated for this with propranolol, as he recalls he was given 20 mg once daily and it did not help.  I reviewed your office note from 06/07/2021.  Gabapentin has not been helpful for his tremor.  He then tried propranolol again in low-dose and it did not help.     He denies any tremor in the right hand or in the legs.  He has not fallen recently. He reports no family history of tremors  with the exception of paternal grandmother who had Parkinson's disease. He drinks caffeine in the form of soda occasionally and 1 cup of coffee per day on average, he admits that he does not always hydrate very well but tries to get 3-4 bottles of water per day.  He quit smoking in 2009.  He currently does not consume any alcohol.  He sleeps fairly well but does endorse restless leg symptoms, his sleep specialist is Dr. Craige Cotta and he had a recent new CPAP machine issued.  He reports compliance with his CPAP.   He had a remote brain MRI with and  without contrast on 04/26/2002 and I reviewed the results: IMPRESSION  UNREMARKABLE HEAD AND PITUITARY GLAND.  THERE IS NO EVIDENCE OF A PITUITARY MASS.   Anxiety and depression he has been on Celexa.  He reports side effects particularly sexual side effects from taking Celexa.  He was on Prozac before that.  He also has a prescription for hydroxyzine.       ROS:   14 system review of systems performed and negative with exception of those listed in HPI  PMH:  Past Medical History:  Diagnosis Date   Enlarged prostate    GERD (gastroesophageal reflux disease)    Hypertension    Neuropathy    RLE   Pneumonia due to COVID-19 virus 07/2020   Restless leg    Sleep apnea    Tremor of left hand     PSH:  Past Surgical History:  Procedure Laterality Date   HERNIA REPAIR      Social History:  Social History   Socioeconomic History   Marital status: Married    Spouse name: Not on file   Number of children: Not on file   Years of education: Not on file   Highest education level: Not on file  Occupational History   Occupation: Dentist work for [employer]  Tobacco Use   Smoking status: Former    Packs/day: 3.00    Years: 18.00    Total pack years: 54.00    Types: Cigarettes    Quit date: 07/03/2007    Years since quitting: 14.6   Smokeless tobacco: Never  Vaping Use   Vaping Use: Never used  Substance and Sexual Activity   Alcohol use: Never   Drug use: Never   Sexual activity: Not on file  Other Topics Concern   Not on file  Social History Narrative   Not on file   Social Determinants of Health   Financial Resource Strain: Not on file  Food Insecurity: Not on file  Transportation Needs: Not on file  Physical Activity: Not on file  Stress: Not on file  Social Connections: Not on file  Intimate Partner Violence: Not on file    Family History:  Family History  Problem Relation Age of Onset   Heart disease Mother    Tremor Father    Hypertension Daughter     Heart disease Maternal Grandmother    Parkinson's disease Paternal Grandmother     Medications:   Current Outpatient Medications on File Prior to Visit  Medication Sig Dispense Refill   amLODipine (NORVASC) 5 MG tablet TAKE ONE (1) TABLET BY MOUTH EVERY DAY 30 tablet 0   aspirin EC 81 MG tablet Take 81 mg by mouth daily.     diclofenac (VOLTAREN) 75 MG EC tablet TAKE ONE TABLET (75MG  TOTAL) BY MOUTH TWO TIMES DAILY 180 tablet 1   famotidine (PEPCID) 20 MG tablet TAKE  THREE TABLETS BY MOUTH EVERY NIGHT AT BEDTIME 270 tablet 1   fluticasone (FLONASE) 50 MCG/ACT nasal spray Place 2 sprays into both nostrils daily. 16 g 6   hydrochlorothiazide (HYDRODIURIL) 50 MG tablet TAKE ONE TABLET (50MG  TOTAL) BY MOUTH DAILY 90 tablet 1   levocetirizine (XYZAL) 5 MG tablet Take 1 tablet (5 mg total) by mouth every evening. 90 tablet 1   mirtazapine (REMERON) 15 MG tablet Take 1 tablet (15 mg total) by mouth at bedtime. 30 tablet 5   Misc Natural Products (URINOZINC PLUS PO) Take by mouth.     Multiple Vitamins-Minerals (MULTIVITAMIN MEN PO) Take by mouth.     NEEDLE, DISP, 23 G (BD DISP NEEDLE) 23G X 1" MISC Use every 14 days with testosterone 50 each 1   pantoprazole (PROTONIX) 40 MG tablet TAKE ONE TABLET (40MG  TOTAL) BY MOUTH DAILY 90 tablet 1   Probiotic Product (HEALTHY COLON PO) Take by mouth.     propranolol (INDERAL) 20 MG tablet Take 1 tablet (20 mg total) by mouth 2 (two) times daily. Follow titration instructions provided separately. 60 tablet 5   rOPINIRole (REQUIP) 4 MG tablet TAKE ONE TABLET (4MG  TOTAL) BY MOUTH AT BEDTIME 90 tablet 0   sildenafil (VIAGRA) 100 MG tablet Take 1 tablet (100 mg total) by mouth daily as needed. 30 tablet 2   Specialty Vitamins Products (ECHINACEA C COMPLETE PO) Take by mouth.     Syringe/Needle, Disp, 18G X 1" 3 ML MISC 1 Device by Does not apply route every 14 (fourteen) days. 12 each 1   tadalafil (CIALIS) 5 MG tablet Take 1 tablet (5 mg total) by mouth daily as  needed for erectile dysfunction. 30 tablet 11   testosterone cypionate (DEPOTESTOSTERONE CYPIONATE) 200 MG/ML injection Inject 0.5 mLs (100 mg total) into the muscle once a week. 10 mL 1   zolpidem (AMBIEN) 5 MG tablet Take 1 tablet (5 mg total) by mouth at bedtime as needed for sleep. 30 tablet 5   No current facility-administered medications on file prior to visit.    Allergies:   Allergies  Allergen Reactions   Mobic [Meloxicam] Other (See Comments)    Mouth sores      OBJECTIVE:  Physical Exam  Vitals:   02/13/22 0819  BP: 120/79  Pulse: 72  Weight: (!) 338 lb 8 oz (153.5 kg)  Height: 5' 11.5" (1.816 m)   Body mass index is 46.55 kg/m. No results found.   General: Morbidly obese pleasant middle-age Caucasian male, seated, in no evident distress Head: head normocephalic and atraumatic.   Neck: supple with no carotid or supraclavicular bruits Cardiovascular: regular rate and rhythm, no murmurs Musculoskeletal: no deformity Skin:  no rash/petichiae Vascular:  Normal pulses all extremities   Neurologic Exam Mental Status: Awake and fully alert.  Fluent speech and language.  Oriented to place and time. Recent and remote memory intact. Attention span, concentration and fund of knowledge appropriate. Mood and affect appropriate.  Cranial Nerves: Pupils equal, briskly reactive to light. Extraocular movements full without nystagmus. Visual fields full to confrontation. Hearing intact. Facial sensation intact. Face, tongue, palate moves normally and symmetrically.  Motor: Normal bulk and tone. Normal strength in all tested extremity muscles.  Unable to appreciate grip or fine motor weakness.  Intermittent left upper extremity postural tremor, no evidence of resting tremor, intention tremor, right upper extremity tremor or lower extremity tremor. Sensory.: intact to touch , pinprick , position and vibratory sensation.  Coordination: Rapid alternating movements  normal in all  extremities. Finger-to-nose and heel-to-shin performed accurately bilaterally. Gait and Station: Arises from chair without difficulty. Stance is normal. Gait demonstrates normal stride length and balance without use of AD.  No evidence of shuffling, good arm swing, no tremor while ambulating Reflexes: 1+ and symmetric. Toes downgoing.        ASSESSMENT/PLAN: KISHAWN PICKAR is a 56 y.o. year old male   Left hand tremor  -Recommend switching propranolol 20mg  BID to Inderal LA 60 mg nightly for persistent tremor  -Did discuss close monitoring of blood pressure and heart rate as well as possible side effects  -Suspect worsening of tremor affected by underlying anxiety and depression.  Currently managed by PCP and follows with therapist.  If tolerates Inderal LA, possibly consider as needed IR formulation  -Again, reassured no evidence of parkinsonism on exam today   Restless leg syndrome  -Currently on Requip 4 mg nightly managed by PCP  -previously, Dr. advised to further discuss with the provider. Will discuss with Dr. Frances Furbish to see if she has any other recommendations or if she wishes for him to continue to follow with sleep provider or PCP for management  Bilateral hand weakness, subjective  -Unable to appreciate on exam, no evidence of muscle wasting  -Denies neck pain, radiculopathy symptoms, sensory loss or numbness/tingling.   -Advised to further discuss with PCP for further evaluation as this can be caused from multiple etiologies but no obvious neurological cause, possibly musculoskeletal      Follow up in 3 months with Dr. Frances Furbish for further medication management or call earlier if needed     CC:  PCP: Frances Furbish, FNP    I spent 36 minutes of face-to-face and non-face-to-face time with patient and wife.  This included previsit chart review, lab review, study review, order entry, electronic health record documentation, patient and wife education and discussion  regarding left hand tremor with current treatment and future treatment options, other concerns as mentioned above and answered all other questions to patient and wife's satisfaction   Jesse Castillo, AGNP-BC  [facility] [address] [place], [place]  Phone [phone] Fax [phone] Note: This document was prepared with digital dictation and possible smart phrase technology. Any transcriptional errors that result from this process are unintentional.

## 2022-02-13 ENCOUNTER — Encounter: Payer: Self-pay | Admitting: Adult Health

## 2022-02-13 ENCOUNTER — Ambulatory Visit: Payer: 59 | Admitting: Adult Health

## 2022-02-13 VITALS — BP 120/79 | HR 72 | Ht 71.5 in | Wt 338.5 lb

## 2022-02-13 DIAGNOSIS — R251 Tremor, unspecified: Secondary | ICD-10-CM | POA: Diagnosis not present

## 2022-02-13 DIAGNOSIS — G2581 Restless legs syndrome: Secondary | ICD-10-CM

## 2022-02-13 DIAGNOSIS — R29898 Other symptoms and signs involving the musculoskeletal system: Secondary | ICD-10-CM

## 2022-02-13 MED ORDER — PROPRANOLOL HCL ER 60 MG PO CP24: 60.0000 mg | ORAL_CAPSULE | Freq: Every day | ORAL | 5 refills | Status: DC

## 2022-02-13 NOTE — Patient Instructions (Addendum)
Your Plan:  Start Inderal LA 60mg  nightly for tremor - please closely monitor your blood pressure and heart rate  Will further discuss your restless leg concerns with Dr. to see if she is okay with managing this for you     Follow up in 3 months with Dr. Frances Furbish or call earlier if needed        Thank you for coming to see Frances Furbish at [facility]. I hope we have been able to provide you high quality care today.  You may receive a patient satisfaction survey over the next few weeks. We would appreciate your feedback and comments so that we may continue to improve ourselves and the health of our patients.

## 2022-02-14 NOTE — Progress Notes (Signed)
Can you please contact patient/wife to advise them that Dr. Frances Furbish still recommends following up with his sleep specialist Dr. Craige Cotta for restless leg syndrome management as this should be managed by him. Also, they can discuss with PCP about getting a second opinion regarding his tremors and etiology behind them. Thank you.

## 2022-02-14 NOTE — Progress Notes (Signed)
Okay sounds good. I did see where you advised them of this at the last visit and reiterated that during yesterday's visit but they wanted me to ask if you would take over this treatment. I will also let them know about considering a second opinion for tremors. Thank you!

## 2022-03-07 ENCOUNTER — Encounter: Payer: Self-pay | Admitting: Family Medicine

## 2022-03-07 ENCOUNTER — Ambulatory Visit (INDEPENDENT_AMBULATORY_CARE_PROVIDER_SITE_OTHER): Payer: 59

## 2022-03-07 ENCOUNTER — Ambulatory Visit: Payer: 59 | Admitting: Family Medicine

## 2022-03-07 VITALS — BP 128/72 | HR 70 | Temp 98.4°F | Ht 71.5 in | Wt 338.4 lb

## 2022-03-07 DIAGNOSIS — F339 Major depressive disorder, recurrent, unspecified: Secondary | ICD-10-CM | POA: Insufficient documentation

## 2022-03-07 DIAGNOSIS — M5441 Lumbago with sciatica, right side: Secondary | ICD-10-CM

## 2022-03-07 DIAGNOSIS — M5136 Other intervertebral disc degeneration, lumbar region: Secondary | ICD-10-CM | POA: Diagnosis not present

## 2022-03-07 DIAGNOSIS — R69 Illness, unspecified: Secondary | ICD-10-CM | POA: Diagnosis not present

## 2022-03-07 DIAGNOSIS — F411 Generalized anxiety disorder: Secondary | ICD-10-CM | POA: Diagnosis not present

## 2022-03-07 DIAGNOSIS — G2581 Restless legs syndrome: Secondary | ICD-10-CM

## 2022-03-07 DIAGNOSIS — G4701 Insomnia due to medical condition: Secondary | ICD-10-CM | POA: Insufficient documentation

## 2022-03-07 DIAGNOSIS — M545 Low back pain, unspecified: Secondary | ICD-10-CM | POA: Diagnosis not present

## 2022-03-07 MED ORDER — PREDNISONE 20 MG PO TABS: 20.0000 mg | ORAL_TABLET | Freq: Every day | ORAL | 0 refills | Status: AC

## 2022-03-07 NOTE — Progress Notes (Signed)
Established Patient Office Visit  Subjective   Patient ID: Jesse Castillo, male    DOB: 1965/12/26  Age: 56 y.o. MRN: [id number]  Chief Complaint  Patient presents with   restless leg    Back Pain    Back Pain   Jesse Castillo is here with his wife today.   He reports an increased in RLS symptoms for the last 1-2 months. He feels the constant urge to move his legs at night. This also occurs during the day if he is sits for too long. This makes it very difficulty for him to sleep. He has failed Ambien for sleep. Pulmonology put him on remeron about a month ago but this has not helped either. He reports anxiety related to his OSA. He is afraid that he will stop breathing in his sleep and this keeps him awake. He does wear CPAP and night and reports that his numbers are good with his CPAP on. He is interested in trying another medication for sleep or anxiety. He does not want to try another SSRI due to side effects that he has had in the past. He takes requip 4 mg for RLS.    Pulmonology took him off of ambien. He has been taking remeron but still has difficulty sleeping. He started on this 2 months ago. He reports that Lorrin Mais was not particularly helping with his sleep either.    He reports that RLS has worsened 1-2 months ago. He feels like he needs to constantly move his legs and body.    He also reports increased back pain on the right lower side for the last month. It radiates to his right hip. He denies numbness or tingling. No injury. He reports relief with sitting. Pain is aggravated by bending, activity, standing. He has been taking voltaren but this is no longer helping. He has had pain like this in the past but not this bad. His has not had imaging on his back before.       03/07/2022    8:07 AM 08/02/2021    9:46 AM 06/07/2021    9:51 AM  Depression screen PHQ 2/9  Decreased Interest 3 2 1   Down, Depressed, Hopeless 1 1 1   PHQ - 2 Score 4 3 2   Altered sleeping 3 3 3   Tired, decreased  energy 3 2 1   Change in appetite 3 3 2   Feeling bad or failure about yourself  2 1 1   Trouble concentrating 3 2 2   Moving slowly or fidgety/restless 2 3 1   Suicidal thoughts 0 0 0  PHQ-9 Score 20 17 12   Difficult doing work/chores Very difficult  Somewhat difficult      03/07/2022    8:08 AM 08/02/2021    9:46 AM 06/07/2021    9:51 AM 04/18/2021   10:44 AM  GAD 7 : Generalized Anxiety Score  Nervous, Anxious, on Edge 3 3 1 3   Control/stop worrying 1 1 1  0  Worry too much - different things 1 1 1  0  Trouble relaxing 3 3 2 2   Restless 3 2 2 1   Easily annoyed or irritable 3 3 1 3   Afraid - awful might happen 2 1 1  0  Total GAD 7 Score 16 14 9 9   Anxiety Difficulty Not difficult at all Very difficult Somewhat difficult Extremely difficult       Review of Systems  Musculoskeletal:  Positive for back pain.   As per HPI.   Objective:  BP 128/72   Pulse 70   Temp 98.4 F (36.9 C) (Temporal)   Ht 5' 11.5" (1.816 m)   Wt (!) 338 lb 6 oz (153.5 kg)   SpO2 95%   BMI 46.54 kg/m    Physical Exam Vitals and nursing note reviewed.  Constitutional:      General: He is not in acute distress.    Appearance: He is obese. He is not ill-appearing, toxic-appearing or diaphoretic.  HENT:     Head: Normocephalic and atraumatic.  Cardiovascular:     Rate and Rhythm: Normal rate and regular rhythm.     Heart sounds: Normal heart sounds. No murmur heard. Pulmonary:     Effort: Pulmonary effort is normal. No respiratory distress.     Breath sounds: No wheezing or rales.  Chest:     Chest wall: No tenderness.  Musculoskeletal:     Lumbar back: No swelling, edema, deformity, signs of trauma, lacerations, spasms, tenderness or bony tenderness. Normal range of motion. No scoliosis.     Right lower leg: No edema.     Left lower leg: No edema.  Skin:    General: Skin is warm and dry.  Neurological:     General: No focal deficit present.     Mental Status: He is alert and oriented  to person, place, and time.  Psychiatric:        Mood and Affect: Mood normal.        Behavior: Behavior normal.    No results found for any visits on 03/07/22.    The 10-year ASCVD risk score (Arnett DK, et al., 2019) is: 7.8%    Assessment & Plan:   Jesse Castillo was seen today for restless leg  and back pain.  Diagnoses and all orders for this visit:  Restless leg Uncontrolled. Will check labs as below prior to changes therapy. Currently on Requip 4 mg daily.  -     Anemia Profile B -     CMP14+EGFR  Acute right-sided low back pain with right-sided sciatica Xray today in office, report pending. Will try prednisone burst as below. Continue voltaren. Will notify patient when report is available.  -     DG Lumbar Spine 2-3 Views; Future -     predniSONE (DELTASONE) 20 MG tablet; Take 1 tablet (20 mg total) by mouth daily with breakfast for 5 days.  Depression, recurrent (Jesse Castillo) Generalized anxiety disorder Insomnia due to medical condition Uncontrolled. Denies SI. No agreeable to SSRI. Discussed trazadone as option for sleep and depression. However would like to only make one change at a time. Will address pending labs for RLS to see if RLS therapy also needs to be changed.    Return in about 3 months (around 06/06/2022) for chronic follow up.   The patient indicates understanding of these issues and agrees with the plan.  Gwenlyn Perking, FNP

## 2022-03-07 NOTE — Patient Instructions (Addendum)
Here is a guide to help [place] find out which weight loss medications will be covered by your insurance plan.  Please check out this web site  [employer] and follow the 3 simple steps.   There is also a phone number you can call if you do not have access to the Internet. [phone] (Monday- Friday 8am-8pm)  [employer] provides coverage information for more than 80% of the inquiries submitted!!   Restless Legs Syndrome Restless legs syndrome is a condition that causes uncomfortable feelings or sensations in the legs, especially while sitting or lying down. The sensations usually cause an overwhelming urge to move the legs. The arms can also sometimes be affected. The condition can range from mild to severe. The symptoms often interfere with a person's ability to sleep. What are the causes? The cause of this condition is not known. What increases the risk? The following factors may make you more likely to develop this condition: Being older than 50. Pregnancy. Being a woman. In general, the condition is more common in women than in men. A family history of the condition. Having iron deficiency. Overuse of caffeine, nicotine, or alcohol. Certain medical conditions, such as kidney disease, Parkinson's disease, or nerve damage. Certain medicines, such as those for high blood pressure, nausea, colds, allergies, depression, and some heart conditions. What are the signs or symptoms? The main symptom of this condition is uncomfortable sensations in the legs, such as: Pulling. Tingling. Prickling. Throbbing. Crawling. Burning. Usually, the sensations: Affect both sides of the body. Are worse when you sit or lie down. Are worse at night. These may make it difficult to fall asleep. Make you have a strong urge to move your legs. Are temporarily relieved by moving your legs or standing. The arms can also be affected, but this is rare. People who have this condition often have tiredness during  the day because of their lack of sleep at night. How is this diagnosed? This condition may be diagnosed based on: Your symptoms. Blood tests. In some cases, you may be monitored in a sleep lab by a specialist (a sleep study). This can detect any disruptions in your sleep. How is this treated? This condition is treated by managing the symptoms. This may include: Lifestyle changes, such as exercising, using relaxation techniques, and avoiding caffeine, alcohol, or tobacco. Iron supplements. Medicines. Parkinson's medications may be tried first. Anti-seizure medications can also be helpful. Follow these instructions at home: General instructions Take over-the-counter and prescription medicines only as told by your health care provider. Use methods to help relieve the uncomfortable sensations, such as: Massaging your legs. Walking or stretching. Taking a cold or hot bath. Keep all follow-up visits. This is important. Lifestyle     Practice good sleep habits. For example, go to bed and get up at the same time every day. Most adults should get 7-9 hours of sleep each night. Exercise regularly. Try to get at least 30 minutes of exercise most days of the week. Practice ways of relaxing, such as yoga or meditation. Avoid caffeine and alcohol. Do not use any products that contain nicotine or tobacco. These products include cigarettes, chewing tobacco, and vaping devices, such as e-cigarettes. If you need help quitting, ask your health care provider. Where to find more information [employer] of Neurological Disorders and Stroke: [employer] Contact a health care provider if: Your symptoms get worse or they do not improve with treatment. Summary Restless legs syndrome is a condition that causes uncomfortable feelings or  sensations in the legs, especially while sitting or lying down. The symptoms often interfere with your ability to sleep. This condition is treated by managing the  symptoms. You may need to make lifestyle changes or take medicines. This information is not intended to replace advice given to you by your health care provider. Make sure you discuss any questions you have with your health care provider. Document Revised: 01/29/2021 Document Reviewed: 01/29/2021 Elsevier Patient Education  2023 [employer].

## 2022-03-08 ENCOUNTER — Other Ambulatory Visit: Payer: Self-pay | Admitting: Family Medicine

## 2022-03-08 ENCOUNTER — Other Ambulatory Visit: Payer: Self-pay | Admitting: Adult Health

## 2022-03-08 ENCOUNTER — Encounter: Payer: Self-pay | Admitting: Family Medicine

## 2022-03-08 ENCOUNTER — Other Ambulatory Visit: Payer: Self-pay | Admitting: *Deleted

## 2022-03-08 DIAGNOSIS — K219 Gastro-esophageal reflux disease without esophagitis: Secondary | ICD-10-CM

## 2022-03-08 DIAGNOSIS — R7989 Other specified abnormal findings of blood chemistry: Secondary | ICD-10-CM

## 2022-03-08 DIAGNOSIS — G4701 Insomnia due to medical condition: Secondary | ICD-10-CM

## 2022-03-08 DIAGNOSIS — I10 Essential (primary) hypertension: Secondary | ICD-10-CM

## 2022-03-08 LAB — ANEMIA PROFILE B
Basophils Absolute: 0 10*3/uL (ref 0.0–0.2)
Basos: 0 %
EOS (ABSOLUTE): 0.1 10*3/uL (ref 0.0–0.4)
Eos: 1 %
Ferritin: 53 ng/mL (ref 30–400)
Folate: >20 ng/mL (ref >3.0)
Hematocrit: 48.9 % (ref 37.5–51.0)
Hemoglobin: 17.1 g/dL (ref 13.0–17.7)
Immature Grans (Abs): 0 10*3/uL (ref 0.0–0.1)
Immature Granulocytes: 0 %
Iron Saturation: 32 % (ref 15–55)
Iron: 106 ug/dL (ref 38–169)
Lymphocytes Absolute: 1.8 10*3/uL (ref 0.7–3.1)
Lymphs: 25 %
MCH: 31.7 pg (ref 26.6–33.0)
MCHC: 35 g/dL (ref 31.5–35.7)
MCV: 91 fL (ref 79–97)
Monocytes Absolute: 1 10*3/uL — ABNORMAL HIGH (ref 0.1–0.9)
Monocytes: 14 %
Neutrophils Absolute: 4.3 10*3/uL (ref 1.4–7.0)
Neutrophils: 60 %
Platelets: 207 10*3/uL (ref 150–450)
RBC: 5.4 x10E6/uL (ref 4.14–5.80)
RDW: 13.5 % (ref 11.6–15.4)
Retic Ct Pct: 2.3 % (ref 0.6–2.6)
Total Iron Binding Capacity: 336 ug/dL (ref 250–450)
UIBC: 230 ug/dL (ref 111–343)
Vitamin B-12: 556 pg/mL (ref 232–1245)
WBC: 7.2 10*3/uL (ref 3.4–10.8)

## 2022-03-08 LAB — CMP14+EGFR
ALT: 28 IU/L (ref 0–44)
AST: 22 IU/L (ref 0–40)
Albumin/Globulin Ratio: 1.8 (ref 1.2–2.2)
Albumin: 4.1 g/dL (ref 3.8–4.9)
Alkaline Phosphatase: 61 IU/L (ref 44–121)
BUN/Creatinine Ratio: 14 (ref 9–20)
BUN: 18 mg/dL (ref 6–24)
Bilirubin Total: 0.6 mg/dL (ref 0.0–1.2)
CO2: 22 mmol/L (ref 20–29)
Calcium: 9.3 mg/dL (ref 8.7–10.2)
Chloride: 103 mmol/L (ref 96–106)
Creatinine, Ser: 1.27 mg/dL (ref 0.76–1.27)
Globulin, Total: 2.3 g/dL (ref 1.5–4.5)
Glucose: 175 mg/dL — ABNORMAL HIGH (ref 70–99)
Potassium: 4.1 mmol/L (ref 3.5–5.2)
Sodium: 139 mmol/L (ref 134–144)
Total Protein: 6.4 g/dL (ref 6.0–8.5)
eGFR: 68 mL/min/{1.73_m2} (ref >59)

## 2022-03-08 MED ORDER — PANTOPRAZOLE SODIUM 40 MG PO TBEC: DELAYED_RELEASE_TABLET | ORAL | 0 refills | Status: DC

## 2022-03-08 MED ORDER — TRAZODONE HCL 50 MG PO TABS: 25.0000 mg | ORAL_TABLET | Freq: Every day | ORAL | 3 refills | Status: DC

## 2022-03-08 MED ORDER — "BD DISP NEEDLE 23G X 1"" MISC": 1 refills | Status: AC

## 2022-03-09 ENCOUNTER — Other Ambulatory Visit: Payer: Self-pay | Admitting: Family Medicine

## 2022-03-09 DIAGNOSIS — I10 Essential (primary) hypertension: Secondary | ICD-10-CM

## 2022-04-04 ENCOUNTER — Encounter: Payer: Self-pay | Admitting: Family Medicine

## 2022-04-04 ENCOUNTER — Ambulatory Visit (INDEPENDENT_AMBULATORY_CARE_PROVIDER_SITE_OTHER): Payer: 59

## 2022-04-04 ENCOUNTER — Ambulatory Visit: Payer: 59 | Admitting: Family Medicine

## 2022-04-04 VITALS — BP 131/78 | HR 76 | Temp 97.9°F | Ht 71.5 in | Wt 348.0 lb

## 2022-04-04 DIAGNOSIS — G4701 Insomnia due to medical condition: Secondary | ICD-10-CM | POA: Diagnosis not present

## 2022-04-04 DIAGNOSIS — G2581 Restless legs syndrome: Secondary | ICD-10-CM

## 2022-04-04 DIAGNOSIS — M25551 Pain in right hip: Secondary | ICD-10-CM

## 2022-04-04 MED ORDER — SEMAGLUTIDE-WEIGHT MANAGEMENT 1.7 MG/0.75ML ~~LOC~~ SOAJ: 1.7000 mg | SUBCUTANEOUS | 0 refills | Status: AC

## 2022-04-04 MED ORDER — SEMAGLUTIDE-WEIGHT MANAGEMENT 1 MG/0.5ML ~~LOC~~ SOAJ: 1.0000 mg | SUBCUTANEOUS | 0 refills | Status: AC

## 2022-04-04 MED ORDER — SEMAGLUTIDE-WEIGHT MANAGEMENT 0.5 MG/0.5ML ~~LOC~~ SOAJ: 0.5000 mg | SUBCUTANEOUS | 0 refills | Status: AC

## 2022-04-04 MED ORDER — SEMAGLUTIDE-WEIGHT MANAGEMENT 2.4 MG/0.75ML ~~LOC~~ SOAJ: 2.4000 mg | SUBCUTANEOUS | 6 refills | Status: AC

## 2022-04-04 MED ORDER — KETOROLAC TROMETHAMINE 60 MG/2ML IM SOLN
60.0000 mg | Freq: Once | INTRAMUSCULAR | Status: AC
  Administered 2022-04-04: 60 mg via INTRAMUSCULAR

## 2022-04-04 MED ORDER — SEMAGLUTIDE-WEIGHT MANAGEMENT 0.25 MG/0.5ML ~~LOC~~ SOAJ: 0.2500 mg | SUBCUTANEOUS | 0 refills | Status: AC

## 2022-04-04 MED ORDER — TRAZODONE HCL 100 MG PO TABS: 100.0000 mg | ORAL_TABLET | Freq: Every day | ORAL | 1 refills | Status: DC

## 2022-04-04 NOTE — Progress Notes (Signed)
Acute Office Visit  Subjective:     Patient ID: Jesse Castillo, male    DOB: [birth year], 56 y.o.   MRN: [id number]  Chief Complaint  Patient presents with   Hip Pain   Insomnia    HPI Patient is in today for right hip pain for last 2-3 months. At first he though that is was from his back but now realizes that it is more in his hip. The pain occurs with weight bearing and activity. It is sharp stabbing pain. The pain is severe. Denies numbness, tingling, swelling, erythema, injury, or fall. He is taking Voltaren BID. He recently had a prednisone burst for his back pain and this was not helpful.   He has started trazodone 50 mg at bedtime. He reports that this has not been helpful yet. He is able to fall asleep easily but then startles awake shortly after due to anxiety. He continues to struggle with RLS. He failed numerous mediations for this. He is currently on requip 4 mg. His ferritin level was low on labs. He reports that he did not receive a call about this for his labs to start a daily iron supplement. He has declined SSRIs due to sexual side effects.   His insurance will cover weight loss medications. He is interested in [place].   ROS As per HPI.      Objective:    BP 131/78   Pulse 76   Temp 97.9 F (36.6 C) (Temporal)   Ht 5' 11.5" (1.816 m)   Wt (!) 348 lb (157.9 kg)   SpO2 95%   BMI 47.86 kg/m    Physical Exam Vitals and nursing note reviewed.  Constitutional:      General: He is not in acute distress.    Appearance: He is not ill-appearing, toxic-appearing or diaphoretic.  Cardiovascular:     Rate and Rhythm: Regular rhythm.     Heart sounds: Normal heart sounds. No murmur heard. Pulmonary:     Effort: Pulmonary effort is normal.     Breath sounds: Normal breath sounds.  Abdominal:     General: Bowel sounds are normal. There is no distension.     Palpations: Abdomen is soft.     Tenderness: There is no abdominal tenderness. There is no guarding or  rebound.  Musculoskeletal:     Lumbar back: No swelling, edema, tenderness or bony tenderness.     Right hip: No tenderness or bony tenderness. Normal range of motion.     Right lower leg: No edema.     Left lower leg: No edema.  Skin:    General: Skin is warm and dry.  Neurological:     General: No focal deficit present.     Mental Status: He is alert and oriented to person, place, and time.  Psychiatric:        Mood and Affect: Mood normal.        Behavior: Behavior normal.     No results found for any visits on 04/04/22.      Assessment & Plan:   Sigfredo was seen today for hip pain and insomnia.  Diagnoses and all orders for this visit:  Right hip pain Xray today, report pending. Toradol injection today, hold voltaren today. Will notify patient of results and plan of care pending radiology report.  -     DG HIP UNILAT W OR W/O PELVIS 2-3 VIEWS RIGHT; Future -     ketorolac (TORADOL) injection 60 mg  Insomnia  due to medical condition Increase trazodone to 100 mg nightly. After 1 week, can increase to 150 mg.  -     traZODone (DESYREL) 100 MG tablet; Take 1 tablet (100 mg total) by mouth at bedtime.  Restless leg Start daily iron supplement. Discussed goal ferritin level of >75.  Morbid obesity ([place]) Wegovy ordered. Discussed that this will likely not be available until next year.  -     Semaglutide-Weight Management 0.25 MG/0.5ML SOAJ; Inject 0.25 mg into the skin once a week for 28 days. -     Semaglutide-Weight Management 0.5 MG/0.5ML SOAJ; Inject 0.5 mg into the skin once a week for 28 days. -     Semaglutide-Weight Management 1 MG/0.5ML SOAJ; Inject 1 mg into the skin once a week for 28 days. -     Semaglutide-Weight Management 1.7 MG/0.75ML SOAJ; Inject 1.7 mg into the skin once a week for 28 days. -     Semaglutide-Weight Management 2.4 MG/0.75ML SOAJ; Inject 2.4 mg into the skin once a week for 28 days.  Return if symptoms worsen or fail to improve. Keep scheduled  follow up appt.   The patient indicates understanding of these issues and agrees with the plan.  Gwenlyn Perking, FNP

## 2022-04-04 NOTE — Patient Instructions (Signed)
Start tazodone 100 mg at bedtime. After 1-2 weeks, can increase to 150 mg nightly.  Go ahead and stop taking mirtazapine.  Start daily iron supplement for restless leg

## 2022-04-05 ENCOUNTER — Encounter: Payer: Self-pay | Admitting: Family Medicine

## 2022-04-07 ENCOUNTER — Other Ambulatory Visit: Payer: Self-pay | Admitting: Family Medicine

## 2022-04-07 DIAGNOSIS — N529 Male erectile dysfunction, unspecified: Secondary | ICD-10-CM

## 2022-04-16 ENCOUNTER — Encounter: Payer: Self-pay | Admitting: Urology

## 2022-04-17 NOTE — Telephone Encounter (Signed)
Patient requesting medication dose change. Please see request.

## 2022-04-18 ENCOUNTER — Telehealth: Payer: Self-pay

## 2022-04-18 MED ORDER — TADALAFIL 10 MG PO TABS: 10.0000 mg | ORAL_TABLET | Freq: Every day | ORAL | 0 refills | Status: DC | PRN

## 2022-04-18 NOTE — Telephone Encounter (Signed)
Prescription change request approved by Dr. Jeffie Pollock.

## 2022-04-30 ENCOUNTER — Other Ambulatory Visit: Payer: Self-pay | Admitting: Urology

## 2022-05-02 ENCOUNTER — Other Ambulatory Visit: Payer: Self-pay | Admitting: Urology

## 2022-05-07 ENCOUNTER — Other Ambulatory Visit: Payer: Self-pay | Admitting: Family Medicine

## 2022-05-07 DIAGNOSIS — G2581 Restless legs syndrome: Secondary | ICD-10-CM

## 2022-05-16 ENCOUNTER — Ambulatory Visit: Payer: 59 | Admitting: Neurology

## 2022-05-22 ENCOUNTER — Ambulatory Visit: Payer: 59 | Admitting: Family Medicine

## 2022-05-23 ENCOUNTER — Other Ambulatory Visit: Payer: Self-pay | Admitting: Family Medicine

## 2022-05-23 DIAGNOSIS — R7989 Other specified abnormal findings of blood chemistry: Secondary | ICD-10-CM

## 2022-05-23 MED ORDER — "SYRINGE/NEEDLE (DISP) 18G X 1"" 3 ML MISC": 1.0000 | 1 refills | Status: DC

## 2022-06-06 ENCOUNTER — Other Ambulatory Visit: Payer: Self-pay | Admitting: Family Medicine

## 2022-06-06 ENCOUNTER — Other Ambulatory Visit: Payer: Self-pay | Admitting: Urology

## 2022-06-06 DIAGNOSIS — N529 Male erectile dysfunction, unspecified: Secondary | ICD-10-CM

## 2022-06-06 DIAGNOSIS — K219 Gastro-esophageal reflux disease without esophagitis: Secondary | ICD-10-CM

## 2022-06-06 DIAGNOSIS — M1990 Unspecified osteoarthritis, unspecified site: Secondary | ICD-10-CM

## 2022-06-09 ENCOUNTER — Other Ambulatory Visit: Payer: Self-pay | Admitting: Family Medicine

## 2022-06-09 DIAGNOSIS — N529 Male erectile dysfunction, unspecified: Secondary | ICD-10-CM

## 2022-06-13 ENCOUNTER — Other Ambulatory Visit: Payer: Self-pay | Admitting: Family Medicine

## 2022-06-13 ENCOUNTER — Ambulatory Visit: Payer: 59 | Admitting: Family Medicine

## 2022-06-13 DIAGNOSIS — K219 Gastro-esophageal reflux disease without esophagitis: Secondary | ICD-10-CM

## 2022-06-13 DIAGNOSIS — I10 Essential (primary) hypertension: Secondary | ICD-10-CM

## 2022-07-04 ENCOUNTER — Other Ambulatory Visit: Payer: Self-pay

## 2022-07-04 ENCOUNTER — Other Ambulatory Visit: Payer: 59

## 2022-07-04 DIAGNOSIS — E291 Testicular hypofunction: Secondary | ICD-10-CM | POA: Diagnosis not present

## 2022-07-05 ENCOUNTER — Other Ambulatory Visit: Payer: 59

## 2022-07-05 ENCOUNTER — Telehealth: Payer: Self-pay

## 2022-07-05 LAB — HEMOGLOBIN AND HEMATOCRIT, BLOOD
Hematocrit: 53.3 % — ABNORMAL HIGH (ref 37.5–51.0)
Hemoglobin: 18.5 g/dL — ABNORMAL HIGH (ref 13.0–17.7)

## 2022-07-05 LAB — TESTOSTERONE: Testosterone: 457 ng/dL (ref 264–916)

## 2022-07-05 NOTE — Telephone Encounter (Signed)
-----   Message from Irine Seal, MD sent at 07/05/2022  9:45 AM EST ----- His hemoglobin is up to 18.5.   He sees me on 1/11 but I think he needs to go ahead and get set up to donate blood if possible.   We need to get that down to continue the testosterone therapy.  ----- Message ----- From: Sherrilyn Rist, CMA Sent: 07/05/2022   9:36 AM EST To: Irine Seal, MD  Please review

## 2022-07-05 NOTE — Telephone Encounter (Signed)
Made patient aware that his hemoglobin is up to 18.5. Patient see Dr. Jeffie Pollock on 1/11 and if it is all possible for him to donate blood to help to get his hemoglobin down to continued his  testosterone therapy. Patient voiced understanding.

## 2022-07-06 ENCOUNTER — Other Ambulatory Visit: Payer: Self-pay | Admitting: Urology

## 2022-07-11 ENCOUNTER — Ambulatory Visit (HOSPITAL_COMMUNITY)
Admission: RE | Admit: 2022-07-11 | Discharge: 2022-07-11 | Disposition: A | Discharge status: Home / Self Care | Payer: 59 | Source: Ambulatory Visit | Attending: Pulmonary Disease | Admitting: Pulmonary Disease

## 2022-07-11 ENCOUNTER — Encounter: Payer: Self-pay | Admitting: Pulmonary Disease

## 2022-07-11 ENCOUNTER — Ambulatory Visit: Payer: 59 | Admitting: Pulmonary Disease

## 2022-07-11 VITALS — BP 138/88 | HR 72 | Temp 98.4°F | Ht 71.5 in | Wt 347.6 lb

## 2022-07-11 DIAGNOSIS — R0609 Other forms of dyspnea: Secondary | ICD-10-CM | POA: Diagnosis not present

## 2022-07-11 DIAGNOSIS — R918 Other nonspecific abnormal finding of lung field: Secondary | ICD-10-CM | POA: Diagnosis not present

## 2022-07-11 DIAGNOSIS — D751 Secondary polycythemia: Secondary | ICD-10-CM | POA: Diagnosis not present

## 2022-07-11 DIAGNOSIS — G4733 Obstructive sleep apnea (adult) (pediatric): Secondary | ICD-10-CM

## 2022-07-11 NOTE — Patient Instructions (Signed)
Chest xray today at [facility]  Will arrange for CPAP titration study and Pulmonary function test at [facility]  Will arrange for referral to hematology  Follow up in 3 months

## 2022-07-11 NOTE — Progress Notes (Signed)
[place] [facility], [facility]  Chief Complaint  Patient presents with   Follow-up    Feels cpap pressures need to be adjusted. SOB lately.    Constitutional:  BP 138/88   Pulse 72   Temp 98.4 F (36.9 C)   Ht 5' 11.5" (1.816 m)   Wt (!) 347 lb 9.6 oz (157.7 kg)   SpO2 97% Comment: ra  BMI 47.80 kg/m   Past Medical History:  BPH, GERD, HTN, ED, Low T, COVID Pneumonia January 2022  Past Surgical History:  He  has a past surgical history that includes Hernia repair.  Brief Summary:  Jesse Castillo is a 57 y.o. male former smoker with obstructive sleep apnea and restless leg syndrome.  He has DOT license.      Subjective:   He is here with his wife.  He gets testosterone injections from urology.  He had recent lab work that showed erythrocytosis with hemoglobin of 18.5.  he was advised to get phlebotomy done so he could continue to get testosterone injections.  His wife is concerned about why his hemoglobin level is elevated.    He used to smoke cigarettes.  He tried inhalers before but these didn't help (advair, albuterol).  He gets a cough with clear phlegm intermittently.  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  He hasn't had a chest xray recently.    He uses CPAP nightly.  He feels like he needs more pressure sometimes.  He wakes up with a gasp and feels short of breath if he falls asleep after working sitting in his chair.    Physical Exam:   Appearance - well kempt   ENMT - no sinus tenderness, no oral exudate, no LAN, Mallampati 4 airway, no stridor  Respiratory - equal breath sounds bilaterally, no wheezing or rales  CV - s1s2 regular rate and rhythm, no murmurs  Ext - no clubbing, no edema  Skin - no rashes  Psych - normal mood and affect     Sleep Tests:  HST 07/27/20 >> AHI AHI 20, SpO2 low 86%. Auto CPAP 06/10/22 07/09/22 >> used on 30 of 30  nights with average 8 hrs 15 min.  Average AHI 3.5 with median CPAP 11 and 95 th percentile CPAP 13 cm H2O  Social History:  He  reports that he quit smoking about 15 years ago. His smoking use included cigarettes. He has a 54.00 pack-year smoking history. He has never used smokeless tobacco. He reports that he does not drink alcohol and does not use drugs.  Family History:  His family history includes Heart disease in his maternal grandmother and mother; Hypertension in his daughter; Parkinson's disease in his paternal grandmother; Tremor in his father.     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  He does have secondary pulmonary causes that could causing erythrocytosis.  While it is less likely, he could have a hematologic condition that could be contributing.  Assessment/Plan:   Erythrocytosis. - advised him to proceed with phlebotomy as instructed by urology - he would like to have further assessment with hematology - chest xray today - will arrange for pulmonary function test to assess for obstructive lung disease  - will arrange for CPAP titration study and then determine if he might need  supplemental oxygen and/or Bipap at night  Obstructive sleep apnea. - he is compliant with therapy and reports benefit from CPAP - he uses [employer] for his DME - current CPAP ordered on 07/29/20 - continue auto CPAP 10 to 20 cm H2O pending CPAP titration study results  Restless leg syndrome. - f/u with PCP  Hand tremor. - followed by Dr. Star Age with [facility]  Obesity. - discussed how his weight can impact control of sleep apnea  Time Spent Involved in Patient Care on Day of Examination:  38 minutes  Follow up:   Patient Instructions  Chest x[facility] today at [facility]  Will arrange for CPAP titration study and Pulmonary function test at [facility]  Will  arrange for referral to hematology  Follow up in 3 months  Medication List:   Allergies as of 07/11/2022       Reactions   Mobic [meloxicam] Other (See Comments)   Mouth sores        Medication List        Accurate as of July 11, 2022 12:10 PM. If you have any questions, ask your nurse or doctor.          STOP taking these medications    levocetirizine 5 MG tablet Commonly known as: XYZAL Stopped by: Chesley Mires, MD       TAKE these medications    amLODipine 5 MG tablet Commonly known as: NORVASC TAKE ONE (1) TABLET BY MOUTH EVERY DAY   aspirin EC 81 MG tablet Take 81 mg by mouth daily.   BD Disp Needle 23G X 1" Misc Generic drug: NEEDLE (DISP) 23 G Use every 14 days with testosterone   diclofenac 75 MG EC tablet Commonly known as: VOLTAREN TAKE ONE TABLET (75MG  TOTAL) BY MOUTH TWO TIMES DAILY   ECHINACEA C COMPLETE PO Take by mouth.   famotidine 20 MG tablet Commonly known as: PEPCID TAKE THREE TABLETS BY MOUTH EVERY NIGHT AT BEDTIME   fluticasone 50 MCG/ACT nasal spray Commonly known as: FLONASE Place 2 sprays into both nostrils daily.   HEALTHY COLON PO Take by mouth.   hydrochlorothiazide 50 MG tablet Commonly known as: HYDRODIURIL TAKE ONE TABLET (50MG  TOTAL) BY MOUTH DAILY   MULTIVITAMIN MEN PO Take by mouth.   pantoprazole 40 MG tablet Commonly known as: PROTONIX TAKE ONE (1) TABLET BY MOUTH EVERY DAY   propranolol ER 60 MG 24 hr capsule Commonly known as: INDERAL LA TAKE ONE CAPSULE (60MG  TOTAL) BY MOUTH DAILY   rOPINIRole 4 MG tablet Commonly known as: REQUIP TAKE ONE TABLET (4MG  TOTAL) BY MOUTH AT BEDTIME   Semaglutide-Weight Management 1.7 MG/0.75ML Soaj Inject 1.7 mg into the skin once a week for 28 days.   Semaglutide-Weight Management 2.4 MG/0.75ML Soaj Inject 2.4 mg into the skin once a week for 28 days. Start taking on: July 29, 2022   sildenafil 100 MG tablet Commonly known as: VIAGRA TAKE 1 TABLET BY  MOUTH ONCE DAILY AS NEEDED   Syringe/Needle (Disp) 18G X 1" 3 ML Misc 1 Device by Does not apply route every 14 (fourteen) days.   tadalafil 10 MG tablet Commonly known as: CIALIS TAKE 1 TABLET BY MOUTH ONCE DAILY AS NEEDED FOR ERECTILE DYSFUNCTION   testosterone cypionate 200 MG/ML injection Commonly known as: DEPOTESTOSTERONE CYPIONATE Inject 0.5 mLs (100 mg total) into the muscle once a week.   traZODone 100 MG tablet Commonly known as: DESYREL Take 1 tablet (100 mg total) by mouth at  bedtime.   URINOZINC PLUS PO Take by mouth.        Signature:  Coralyn Helling, MD [facility] Pulmonary/Critical Care Pager - [phone] 07/11/2022, 12:10 PM

## 2022-07-12 ENCOUNTER — Ambulatory Visit (INDEPENDENT_AMBULATORY_CARE_PROVIDER_SITE_OTHER): Payer: 59 | Admitting: Urology

## 2022-07-12 VITALS — BP 145/92 | HR 87

## 2022-07-12 DIAGNOSIS — D751 Secondary polycythemia: Secondary | ICD-10-CM

## 2022-07-12 DIAGNOSIS — N529 Male erectile dysfunction, unspecified: Secondary | ICD-10-CM

## 2022-07-12 DIAGNOSIS — N401 Enlarged prostate with lower urinary tract symptoms: Secondary | ICD-10-CM

## 2022-07-12 DIAGNOSIS — E291 Testicular hypofunction: Secondary | ICD-10-CM | POA: Diagnosis not present

## 2022-07-12 DIAGNOSIS — N3943 Post-void dribbling: Secondary | ICD-10-CM

## 2022-07-12 DIAGNOSIS — N138 Other obstructive and reflux uropathy: Secondary | ICD-10-CM

## 2022-07-12 MED ORDER — SILDENAFIL CITRATE 100 MG PO TABS: 100.0000 mg | ORAL_TABLET | Freq: Every day | ORAL | 11 refills | Status: DC | PRN

## 2022-07-12 MED ORDER — TADALAFIL 5 MG PO TABS: 5.0000 mg | ORAL_TABLET | Freq: Every day | ORAL | 11 refills | Status: DC | PRN

## 2022-07-12 NOTE — Progress Notes (Signed)
Subjective: 1. Hypogonadism in male   2. Erectile dysfunction, unspecified erectile dysfunction type   3. Secondary polycythemia   4. BPH with urinary obstruction   5. Post-void dribbling     07/12/22: Lamondre returns today in f/u.  He hasn't donated blood as requested.  He is scheduled by Dr. Craige Cotta to see hematology on 08/02/22. He has not had his testosterone injection in 2 weeks.  His Hgb on 1/3 was up to 18.5 with a T of 457.  He remains on tadalafil but went to 10mg  qod because of supply issues.  He uses the sildenafil 100mg  prn as well.   His IPSS is 11.  He has nocturia x 1.  He reports a slow stream but is primarily reporting post void dribbling.  He has had no hematuria.    GU hx: Keyshawn is a 57 yo male who is sent for a history of hypogonadism that is being managed with Testosterone cypionate 100mg  q1wk.  His level is 751 at a peak.  His Hgb is stab1e at 17.4 and he has been donating blood regularly.  He has improved energy with the med.  His PSA was 0.8 on 02/08/21.  He has moderate LUTS with an IPSS of 9.  He can have some hesitancy but his IPSS is 8 with nocturia x 2.  His PSA is 0.6.  He has no prior stones, UTI's or GU surgery.   He has ED and is on sildenafil and  tadalafil with an improved response but he had decreased sensation difficulty with reaching a climax but that is better off of the Prozac. He doesn't have any problems with the injections.  He still has some issues with irritability. UA is clear.   ROS:  Review of Systems  Constitutional:  Positive for chills.  HENT:  Positive for congestion.   Eyes:  Positive for blurred vision.  Respiratory:  Positive for cough and shortness of breath.   Cardiovascular:  Positive for leg swelling.  Gastrointestinal:  Positive for heartburn and nausea.  Musculoskeletal:  Positive for back pain.  Neurological:  Positive for weakness.  Endo/Heme/Allergies:  Bruises/bleeds easily.  Psychiatric/Behavioral:  Positive for depression and memory  loss.   All other systems reviewed and are negative.   Allergies  Allergen Reactions   Mobic [Meloxicam] Other (See Comments)    Mouth sores    Past Medical History:  Diagnosis Date   Enlarged prostate    GERD (gastroesophageal reflux disease)    Hypertension    Neuropathy    RLE   Pneumonia due to COVID-19 virus 07/2020   Restless leg    Sleep apnea    Tremor of left hand     Past Surgical History:  Procedure Laterality Date   HERNIA REPAIR      Social History   Socioeconomic History   Marital status: Married    Spouse name: Not on file   Number of children: Not on file   Years of education: Not on file   Highest education level: Not on file  Occupational History   Occupation: work for 04/10/21  Tobacco Use   Smoking status: Former    Packs/day: 3.00    Years: 18.00    Total pack years: 54.00    Types: Cigarettes    Quit date: 07/03/2007    Years since quitting: 15.0   Smokeless tobacco: Never  Vaping Use   Vaping Use: Never used  Substance and Sexual Activity   Alcohol use:  Never   Drug use: Never   Sexual activity: Not on file  Other Topics Concern   Not on file  Social History Narrative   Not on file   Social Determinants of Health   Financial Resource Strain: Not on file  Food Insecurity: Not on file  Transportation Needs: Not on file  Physical Activity: Not on file  Stress: Not on file  Social Connections: Not on file  Intimate Partner Violence: Not on file    Family History  Problem Relation Age of Onset   Heart disease Mother    Tremor Father    Hypertension Daughter    Heart disease Maternal Grandmother    Parkinson's disease Paternal Grandmother     Anti-infectives: Anti-infectives (From admission, onward)    None       Current Outpatient Medications  Medication Sig Dispense Refill   amLODipine (NORVASC) 5 MG tablet TAKE ONE (1) TABLET BY MOUTH EVERY DAY 30 tablet 5   aspirin EC 81 MG tablet Take 81 mg by mouth  daily.     diclofenac (VOLTAREN) 75 MG EC tablet TAKE ONE TABLET (75MG  TOTAL) BY MOUTH TWO TIMES DAILY 180 tablet 0   famotidine (PEPCID) 20 MG tablet TAKE THREE TABLETS BY MOUTH EVERY NIGHT AT BEDTIME 270 tablet 0   fluticasone (FLONASE) 50 MCG/ACT nasal spray Place 2 sprays into both nostrils daily. 16 g 6   hydrochlorothiazide (HYDRODIURIL) 50 MG tablet TAKE ONE TABLET (50MG  TOTAL) BY MOUTH DAILY 90 tablet 0   Misc Natural Products (URINOZINC PLUS PO) Take by mouth.     Multiple Vitamins-Minerals (MULTIVITAMIN MEN PO) Take by mouth.     NEEDLE, DISP, 23 G (BD DISP NEEDLE) 23G X 1" MISC Use every 14 days with testosterone 50 each 1   pantoprazole (PROTONIX) 40 MG tablet TAKE ONE (1) TABLET BY MOUTH EVERY DAY 90 tablet 0   Probiotic Product (HEALTHY COLON PO) Take by mouth.     propranolol ER (INDERAL LA) 60 MG 24 hr capsule TAKE ONE CAPSULE (60MG  TOTAL) BY MOUTH DAILY 30 capsule 5   rOPINIRole (REQUIP) 4 MG tablet TAKE ONE TABLET (4MG  TOTAL) BY MOUTH AT BEDTIME 90 tablet 0   Semaglutide-Weight Management 1.7 MG/0.75ML SOAJ Inject 1.7 mg into the skin once a week for 28 days. 3 mL 0   [START ON 07/29/2022] Semaglutide-Weight Management 2.4 MG/0.75ML SOAJ Inject 2.4 mg into the skin once a week for 28 days. 3 mL 6   Specialty Vitamins Products (ECHINACEA C COMPLETE PO) Take by mouth.     Syringe/Needle, Disp, 18G X 1" 3 ML MISC 1 Device by Does not apply route every 14 (fourteen) days. 12 each 1   tadalafil (CIALIS) 10 MG tablet TAKE 1 TABLET BY MOUTH ONCE DAILY AS NEEDED FOR ERECTILE DYSFUNCTION 10 tablet 0   tadalafil (CIALIS) 5 MG tablet Take 1 tablet (5 mg total) by mouth daily as needed for erectile dysfunction. 30 tablet 11   testosterone cypionate (DEPOTESTOSTERONE CYPIONATE) 200 MG/ML injection Inject 0.5 mLs (100 mg total) into the muscle once a week. 10 mL 1   traZODone (DESYREL) 100 MG tablet Take 1 tablet (100 mg total) by mouth at bedtime. 90 tablet 1   sildenafil (VIAGRA) 100 MG  tablet Take 1 tablet (100 mg total) by mouth daily as needed. 30 tablet 11   No current facility-administered medications for this visit.     Objective: Vital signs in last 24 hours: BP (!) 145/92   Pulse 87  Intake/Output from previous day: No intake/output data recorded. Intake/Output this shift: @IOTHISSHIFT @   Physical Exam Vitals reviewed.  Constitutional:      Appearance: Normal appearance. He is obese.  Neurological:     Mental Status: He is alert.     Lab Results:  Recent Results (from the past 2160 hour(s))  Hemoglobin and hematocrit, blood     Status: Abnormal   Collection Time: 07/04/22  9:47 AM  Result Value Ref Range   Hemoglobin 18.5 (H) 13.0 - 17.7 g/dL   Hematocrit 53.3 (H) 37.5 - 51.0 %  Testosterone     Status: None   Collection Time: 07/04/22  9:47 AM  Result Value Ref Range   Testosterone 457 264 - 916 ng/dL    Comment: Adult male reference interval is based on a population of healthy nonobese males (BMI <30) between 42 and 74 years old. [place], [place] [phone]. PMID: [id number].      BMET No results for input(s): "NA", "K", "CL", "CO2", "GLUCOSE", "BUN", "CREATININE", "CALCIUM" in the last 72 hours. PT/INR No results for input(s): "LABPROT", "INR" in the last 72 hours. ABG No results for input(s): "PHART", "HCO3" in the last 72 hours.  Invalid input(s): "PCO2", "PO2"  Studies/Results: DG Chest 2 View  Result Date: 07/12/2022 CLINICAL DATA:  57 year old male with a history of Dyspnea on exertion EXAM: CHEST - 2 VIEW COMPARISON:  07/21/2020 FINDINGS: Cardiomediastinal silhouette unchanged in size and contour. No evidence of central vascular congestion. No interlobular septal thickening. Similar appearance of reticulonodular opacities at the lung bases compared to the prior. No new confluent airspace disease. No pneumothorax or pleural effusion. No acute displaced fracture. Degenerative changes of the spine. IMPRESSION: Mild  reticular opacity at the lung bases, potentially sequela of atypical infection, either acute or chronic. Negative for lobar pneumonia or overt edema. Electronically Signed   By: Corrie Mckusick D.O.   On: 07/12/2022 16:27     Assessment/Plan: Hypogonadism.  I have requested he hold the TRT until he has been evaluated by hematology on 08/02/22.   Polycythemia.  His Hgb is up further.  He will need phlebotomy and is to see hematology on 08/02/22.     ED.  He is responding to the combination of daily tadalafil and sildenafil prn but has reduced sensation and delayed ejaculation. His scripts refilled.    BPH with urgency.  This has improved with the tadalafil but he has bothersome post void dribbling.      Meds ordered this encounter  Medications   tadalafil (CIALIS) 5 MG tablet    Sig: Take 1 tablet (5 mg total) by mouth daily as needed for erectile dysfunction.    Dispense:  30 tablet    Refill:  11   sildenafil (VIAGRA) 100 MG tablet    Sig: Take 1 tablet (100 mg total) by mouth daily as needed.    Dispense:  30 tablet    Refill:  11      Orders Placed This Encounter  Procedures   Urinalysis, Routine w reflex microscopic   Hemoglobin and hematocrit, blood    Standing Status:   Future    Standing Expiration Date:   07/13/2023   Testosterone    Standing Status:   Future    Standing Expiration Date:   07/13/2023   PSA, total and free    Standing Status:   Future    Standing Expiration Date:   07/13/2023     Return in about 6 months (around 01/10/2023)  for with labs. .    CC: Hendricks Limes FNP     Irine Seal 07/13/2022 [phone] Patient ID: Thereasa Solo, male   DOB: April 12, 1966, 57 y.o.   MRN: [id number]

## 2022-07-16 ENCOUNTER — Telehealth: Payer: Self-pay

## 2022-07-16 NOTE — Telephone Encounter (Signed)
Called and spoke with paitent about CXR results. He states his cough is bothering hi and that it is a persistent mainly dry cough with occ. Clear sputum. He wants to know what he can take for the vcough/ if we can call anything in.   He also was asking about CPAP titration study- progress on getting it scheduled. PCCs do you have an update on this?   Patient is aware that message will more than likely not be addressed today by Dr. Halford Chessman since he is out of office. Please advise.

## 2022-07-18 NOTE — Telephone Encounter (Signed)
Called and spoke with patient  Notified him of denial of CPAP titration and that there is a peer to peer review being worked out and we will give him a call once we get it straightened out and have an update  Also gave him recs of Delsym OTC for cough. Will update patient once we have an update about approval/ denial. Nothing further needed at this time.

## 2022-07-18 NOTE — Telephone Encounter (Signed)
He can try OTC delsym for his cough.

## 2022-07-20 NOTE — Telephone Encounter (Signed)
P2P was completed and [employer] approved the Sleep study. I got the pt scheduled for 2/19 Mon. At [facility] He is aware of his appt info. I also provided him the number to the sleep center in case he needs to resch. Nothing further needed.

## 2022-08-01 NOTE — Progress Notes (Signed)
[address], [place] [address]   CLINIC:  Medical Oncology/Hematology  CONSULT NOTE  Patient Care Team: Gwenlyn Perking, FNP as PCP - General (Family Medicine)  CHIEF COMPLAINTS/PURPOSE OF CONSULTATION:  Erythrocytosis   HISTORY OF PRESENTING ILLNESS:   Jesse Castillo 57 y.o. male is here at the request of Dr. Halford Chessman (pulmonology) for the evaluation and treatment of his erythrocytosis.  HemoCue from 07/04/2022 showed hemoglobin 18.5 and hematocrit 53.3%.  He has had intermittent mild elevations in hemoglobin for the past 3 years.  He has been receiving testosterone injections via urology since 2021, although this has been held for the past month, pending hematology evaluation.  Patient has sleep apnea and reports that he is very compliant with CPAP, although he is currently waiting to schedule CPAP titration study.  Medical history also significant for COPD and possible obesity hypoventilation syndrome.  He is on hydrochlorothiazide.  He is a former smoker.  He denies any obvious carbon monoxide exposure, but works in a warehouse driving a Forensic scientist.  Medical history includes additional cardiac risk factors (obesity and hypertension); he takes aspirin 81 mg daily.  No history of DVT, PE, CVA, or MI.  He was recommended to donate blood by Dr. Jeffie Pollock (urology) which he has only done twice, most recently 2 months ago.  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  He reports eye pain that feels like "headache behind the eyes."  He reports that he "feels hot at night."  No B symptoms, aquagenic pruritus, Raynaud's phenomenon, or other vasomotor symptoms.  No strokelike symptoms, dizziness, tinnitus, vision changes.  Past medical history includes tremor, hypertension, COPD, morbid obesity, sleep apnea, and hypogonadism.  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  Patient quit smoking in 2009, had previously smoked 3 PPD since age 69.  He previously drank alcohol on occasion, but has not had any alcohol to drink since 2009.  He denies any current or previous illicit drug use.  No family history of blood disorder or myeloproliferative neoplasm.  Patient's paternal aunt had uterine cancer.  MEDICAL HISTORY:  Past Medical History:  Diagnosis Date   Enlarged prostate    GERD (gastroesophageal reflux disease)    Hypertension    Neuropathy    RLE   Pneumonia due to COVID-19 virus 07/2020   Restless leg    Sleep apnea    Tremor of left hand    SURGICAL HISTORY: Past Surgical History:  Procedure Laterality Date   HERNIA REPAIR      SOCIAL HISTORY: Social History   Socioeconomic History   Marital status: Married    Spouse name: Not on file   Number of children: Not on file   Years of education: Not on file   Highest education level: Not on file  Occupational History   Occupation: Comptroller work for [employer]  Tobacco Use   Smoking status: Former    Packs/day: 3.00    Years: 18.00    Total pack years: 54.00    Types: Cigarettes    Quit date: 07/03/2007    Years since quitting: 15.0   Smokeless tobacco: Never  Vaping Use   Vaping Use: Never used  Substance and Sexual Activity   Alcohol use: Never   Drug use: Never   Sexual activity: Not on file  Other Topics Concern  Not on file  Social History Narrative   Not on file   Social Determinants of Health   Financial Resource Strain: Not on file  Food Insecurity: Not on file  Transportation Needs: Not on file  Physical Activity: Not on file  Stress: Not on file  Social Connections: Not on file  Intimate Partner Violence: Not on file    FAMILY HISTORY: Family History  Problem Relation Age of Onset   Heart disease Mother    Tremor Father    Hypertension Daughter    Heart disease Maternal Grandmother    Parkinson's disease Paternal Grandmother      ALLERGIES:  is allergic to mobic [meloxicam].  MEDICATIONS:  Current Outpatient Medications  Medication Sig Dispense Refill   amLODipine (NORVASC) 5 MG tablet TAKE ONE (1) TABLET BY MOUTH EVERY DAY 30 tablet 5   aspirin EC 81 MG tablet Take 81 mg by mouth daily.     diclofenac (VOLTAREN) 75 MG EC tablet TAKE ONE TABLET (75MG  TOTAL) BY MOUTH TWO TIMES DAILY 180 tablet 0   famotidine (PEPCID) 20 MG tablet TAKE THREE TABLETS BY MOUTH EVERY NIGHT AT BEDTIME 270 tablet 0   fluticasone (FLONASE) 50 MCG/ACT nasal spray Place 2 sprays into both nostrils daily. 16 g 6   hydrochlorothiazide (HYDRODIURIL) 50 MG tablet TAKE ONE TABLET (50MG  TOTAL) BY MOUTH DAILY 90 tablet 0   Misc Natural Products (URINOZINC PLUS PO) Take by mouth.     Multiple Vitamins-Minerals (MULTIVITAMIN MEN PO) Take by mouth.     NEEDLE, DISP, 23 G (BD DISP NEEDLE) 23G X 1" MISC Use every 14 days with testosterone 50 each 1   pantoprazole (PROTONIX) 40 MG tablet TAKE ONE (1) TABLET BY MOUTH EVERY DAY 90 tablet 0   Probiotic Product (HEALTHY COLON PO) Take by mouth.     propranolol ER (INDERAL LA) 60 MG 24 hr capsule TAKE ONE CAPSULE (60MG  TOTAL) BY MOUTH DAILY 30 capsule 5   rOPINIRole (REQUIP) 4 MG tablet TAKE ONE TABLET (4MG  TOTAL) BY MOUTH AT BEDTIME 90 tablet 0   Semaglutide-Weight Management 2.4 MG/0.75ML SOAJ Inject 2.4 mg into the skin once a week for 28 days. 3 mL 6   sildenafil (VIAGRA) 100 MG tablet Take 1 tablet (100 mg total) by mouth daily as needed. 30 tablet 11   Specialty Vitamins Products (ECHINACEA C COMPLETE PO) Take by mouth.     Syringe/Needle, Disp, 18G X 1" 3 ML MISC 1 Device by Does not apply route every 14 (fourteen) days. 12 each 1   tadalafil (CIALIS) 10 MG tablet TAKE 1 TABLET BY MOUTH ONCE DAILY AS NEEDED FOR ERECTILE DYSFUNCTION 10 tablet 0   tadalafil (CIALIS) 5 MG tablet Take 1 tablet (5 mg total) by mouth daily as needed for erectile dysfunction. 30 tablet 11   testosterone cypionate  (DEPOTESTOSTERONE CYPIONATE) 200 MG/ML injection Inject 0.5 mLs (100 mg total) into the muscle once a week. 10 mL 1   traZODone (DESYREL) 100 MG tablet Take 1 tablet (100 mg total) by mouth at bedtime. 90 tablet 1   No current facility-administered medications for this visit.    REVIEW OF SYSTEMS:    Review of Systems  Constitutional:  Positive for fatigue (40 to 50%). Negative for appetite change, chills, diaphoresis, fever and unexpected weight change.  HENT:   Negative for lump/mass and nosebleeds.   Eyes:  Negative for eye problems.  Respiratory:  Positive for shortness of breath (With exertion). Negative for cough and hemoptysis.  Cardiovascular:  Negative for chest pain, leg swelling and palpitations.  Gastrointestinal:  Negative for abdominal pain, blood in stool, constipation, diarrhea, nausea and vomiting.  Genitourinary:  Negative for hematuria.   Skin: Negative.   Neurological:  Positive for headaches and numbness. Negative for dizziness and light-headedness.  Hematological:  Does not bruise/bleed easily.  Psychiatric/Behavioral:  Positive for sleep disturbance. The patient is nervous/anxious.       PHYSICAL EXAMINATION:   ECOG PERFORMANCE STATUS: 1 - Symptomatic but completely ambulatory  There were no vitals filed for this visit. There were no vitals filed for this visit.  Physical Exam Constitutional:      Appearance: Normal appearance. He is morbidly obese.  HENT:     Head: Normocephalic and atraumatic.     Mouth/Throat:     Mouth: Mucous membranes are moist.  Eyes:     Extraocular Movements: Extraocular movements intact.     Pupils: Pupils are equal, round, and reactive to light.  Cardiovascular:     Rate and Rhythm: Normal rate and regular rhythm.     Pulses: Normal pulses.     Heart sounds: Normal heart sounds.  Pulmonary:     Effort: Pulmonary effort is normal.     Breath sounds: Decreased air movement present. Decreased breath sounds present.   Abdominal:     General: Bowel sounds are normal.     Palpations: Abdomen is soft.     Tenderness: There is no abdominal tenderness.  Musculoskeletal:        General: No swelling.     Right lower leg: No edema.     Left lower leg: No edema.  Lymphadenopathy:     Cervical: No cervical adenopathy.  Skin:    General: Skin is warm and dry.  Neurological:     General: No focal deficit present.     Mental Status: He is alert and oriented to person, place, and time.  Psychiatric:        Mood and Affect: Mood normal.        Behavior: Behavior normal.     LABORATORY DATA:  I have reviewed the data as listed Recent Results (from the past 2160 hour(s))  Hemoglobin and hematocrit, blood     Status: Abnormal   Collection Time: 07/04/22  9:47 AM  Result Value Ref Range   Hemoglobin 18.5 (H) 13.0 - 17.7 g/dL   Hematocrit 53.3 (H) 37.5 - 51.0 %  Testosterone     Status: None   Collection Time: 07/04/22  9:47 AM  Result Value Ref Range   Testosterone 457 264 - 916 ng/dL    Comment: Adult male reference interval is based on a population of healthy nonobese males (BMI <30) between 87 and 51 years old. [place], [place] [phone]. PMID: [id number].     RADIOGRAPHIC STUDIES: I have personally reviewed the radiological images as listed and agreed with the findings in the report. DG Chest 2 View  Result Date: 07/12/2022 CLINICAL DATA:  57 year old male with a history of Dyspnea on exertion EXAM: CHEST - 2 VIEW COMPARISON:  07/21/2020 FINDINGS: Cardiomediastinal silhouette unchanged in size and contour. No evidence of central vascular congestion. No interlobular septal thickening. Similar appearance of reticulonodular opacities at the lung bases compared to the prior. No new confluent airspace disease. No pneumothorax or pleural effusion. No acute displaced fracture. Degenerative changes of the spine. IMPRESSION: Mild reticular opacity at the lung bases, potentially sequela of atypical  infection, either acute or chronic. Negative for lobar pneumonia  or overt edema. Electronically Signed   By: Gilmer Mor D.O.   On: 07/12/2022 16:27     ASSESSMENT & PLAN:  1.  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  He takes aspirin 81 mg daily. - No history of DVT, PE, CVA, or MI. - Recommended to donate blood by Dr. Annabell Howells (urology, which he has only done twice, most recently about 2 months ago) - Symptomatic with erythromelalgia of bilateral feet and "headache behind the eyes" for the past 3 to 4 months - Extensive discussion today with patient regarding potential causes of erythrocytosis and the main risks of erythrocytosis and treatments thereof. 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  Additional contributing factors include sleep apnea, hydrochlorothiazide, and COPD. - PLAN: We will check erythropoietin and JAK2 with reflex to rule out any clonal process.  Check carbon monoxide. - Recommend voluntary blood donation every 2 months.  Patient cautioned NOT to donate plasma. - Continue aspirin 81 mg daily. - From hematology standpoint, patient is okay to resume testosterone - Same-day labs (CBC) + OFFICE visit in 4 weeks (possible discharge at that time if he has good urology/pulmonology/PCP follow-up on his blood levels)  2.  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 Patient quit smoking in 2009, had previously smoked 3 PPD since age 72. He previously drank alcohol on occasion, but has not had any alcohol to drink since 2009. He denies any current or previous illicit drug use.  - FAMILY: No family history of blood disorder or myeloproliferative neoplasm.  Paternal aunt had uterine cancer.  PLAN SUMMARY: >> Labs TODAY (erythropoietin, JAK2 with reflex, carbon monoxide, CBC/D) >> Same-day labs (CBC) + OFFICE visit in 1 month    All questions were answered. The patient knows to call the clinic with any problems, questions or concerns.  Medical decision making: Moderate  Time spent on visit: I spent 45 minutes counseling the patient face to face. The total time spent in the appointment was 60 minutes and more than 50% was on counseling.  I, Rojelio Brenner PA-C, have seen this patient in conjunction with Dr. Doreatha Massed.  Greater than 50% of visit was performed by Dr. Ellin Saba.   Carnella Guadalajara, PA-C 08/03/2022 9:22 PM  DR. Sigismund Cross: I have independently evaluated this patient and formulated my assessment and plan.  I agree with HPI, assessment and plan written by Elizebeth Koller, PA-C.  Patient seen at the request of Dr. Wilson Singer.  He is receiving testosterone injections for hypogonadism.  He also has obstructive sleep apnea.  He is also on HCTZ 50 mg daily for leg swellings.  He receives testosterone injections on a weekly basis.  Testosterone injection was held as his hemoglobin was 18.5 on 07/04/2022.  We discussed clonal and nonclonal causes of erythrocytosis.  We will be checking serum EPO level and JAK2 V6 1  7-reflex mutation testing to evaluate for polycythemia vera.  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  I have recommended  that he should start donating blood every 2 months to keep the hemoglobin in the normal range.  He will be followed in 1 month to discuss results.

## 2022-08-02 ENCOUNTER — Other Ambulatory Visit: Payer: Self-pay

## 2022-08-02 ENCOUNTER — Encounter: Payer: Self-pay | Admitting: Hematology

## 2022-08-02 ENCOUNTER — Inpatient Hospital Stay: Payer: 59 | Attending: Hematology | Admitting: Hematology

## 2022-08-02 ENCOUNTER — Inpatient Hospital Stay: Payer: 59

## 2022-08-02 VITALS — BP 130/90 | HR 73 | Temp 97.8°F | Resp 16 | Wt 353.0 lb

## 2022-08-02 DIAGNOSIS — I10 Essential (primary) hypertension: Secondary | ICD-10-CM | POA: Insufficient documentation

## 2022-08-02 DIAGNOSIS — E291 Testicular hypofunction: Secondary | ICD-10-CM | POA: Diagnosis not present

## 2022-08-02 DIAGNOSIS — D751 Secondary polycythemia: Secondary | ICD-10-CM | POA: Insufficient documentation

## 2022-08-02 DIAGNOSIS — R251 Tremor, unspecified: Secondary | ICD-10-CM | POA: Diagnosis not present

## 2022-08-02 DIAGNOSIS — Z808 Family history of malignant neoplasm of other organs or systems: Secondary | ICD-10-CM | POA: Diagnosis not present

## 2022-08-02 DIAGNOSIS — Z79899 Other long term (current) drug therapy: Secondary | ICD-10-CM | POA: Diagnosis not present

## 2022-08-02 DIAGNOSIS — Z87891 Personal history of nicotine dependence: Secondary | ICD-10-CM | POA: Diagnosis not present

## 2022-08-02 DIAGNOSIS — J449 Chronic obstructive pulmonary disease, unspecified: Secondary | ICD-10-CM | POA: Diagnosis not present

## 2022-08-02 DIAGNOSIS — G4730 Sleep apnea, unspecified: Secondary | ICD-10-CM | POA: Insufficient documentation

## 2022-08-02 DIAGNOSIS — I7381 Erythromelalgia: Secondary | ICD-10-CM | POA: Diagnosis not present

## 2022-08-02 LAB — CBC WITH DIFFERENTIAL/PLATELET
Abs Immature Granulocytes: 0.02 10*3/uL (ref 0.00–0.07)
Basophils Absolute: 0.1 10*3/uL (ref 0.0–0.1)
Basophils Relative: 1 %
Eosinophils Absolute: 0.1 10*3/uL (ref 0.0–0.5)
Eosinophils Relative: 2 %
HCT: 47.1 % (ref 39.0–52.0)
Hemoglobin: 17 g/dL (ref 13.0–17.0)
Immature Granulocytes: 0 %
Lymphocytes Relative: 26 %
Lymphs Abs: 2 10*3/uL (ref 0.7–4.0)
MCH: 32.5 pg (ref 26.0–34.0)
MCHC: 36.1 g/dL — ABNORMAL HIGH (ref 30.0–36.0)
MCV: 90.1 fL (ref 80.0–100.0)
Monocytes Absolute: 1.2 10*3/uL — ABNORMAL HIGH (ref 0.1–1.0)
Monocytes Relative: 15 %
Neutro Abs: 4.3 10*3/uL (ref 1.7–7.7)
Neutrophils Relative %: 56 %
Platelets: 203 10*3/uL (ref 150–400)
RBC: 5.23 MIL/uL (ref 4.22–5.81)
RDW: 12.3 % (ref 11.5–15.5)
WBC: 7.6 10*3/uL (ref 4.0–10.5)
nRBC: 0 % (ref 0.0–0.2)

## 2022-08-02 NOTE — Patient Instructions (Signed)
[facility] **   You were seen today by Dr. Delton Coombes & Tarri Abernethy PA-C for your elevated hemoglobin/red blood cells ("erythrocytosis").    ERYTHROCYTOSIS: You have had some slight elevation in your red blood cells for the past 3 years, but it has gotten slightly worse in the past few months.   Elevated red blood cells can be caused by certain genetic mutations.  In your case, your elevated hemoglobin is most likely related to the following conditions: Testosterone injections Sleep apnea Diuretic (hydrochlorothiazide) use We will check LABS TODAY to help figure out the exact cause of your high red blood cells. Having high red blood cells can make your blood "too thick" which places you at increased risk of blood clot, heart attack, and stroke. To treat your high red blood cells, we recommend... Aspirin 81 mg daily to decrease your risk of blood clots, heart attack, and stroke. Donate blood once every 6 to 8 weeks to help bring your blood cells back to a normal level.  (Do NOT donate plasma.)  FOLLOW-UP APPOINTMENT: We will see you for follow-up visit in 1 month to discuss test results, and we will recheck your blood levels at that time.  ** Thank you for trusting me with your healthcare!  I strive to provide all of my patients with quality care at each visit.  If you receive a survey for this visit, I would be so grateful to you for taking the time to provide feedback.  Thank you in advance!  ~ Ernestene Coover                   Dr. Derek Jack   &   Tarri Abernethy, PA-C   - - - - - - - - - - - - - - - - - -    Thank you for choosing [facility] to provide your oncology and hematology care.  To afford each patient quality time with our provider, please arrive at least 15 minutes before your scheduled appointment time.   If you have a lab appointment with the  [facility] please come in thru the Main Entrance and check in at the main information desk.  You need to re-schedule your appointment should you arrive 10 or more minutes late.  We strive to give you quality time with our providers, and arriving late affects you and other patients whose appointments are after yours.  Also, if you no show three or more times for appointments you may be dismissed from the clinic at the providers discretion.     Again, thank you for choosing [facility].  Our hope is that these requests will decrease the amount of time that you wait before being seen by our physicians.       _____________________________________________________________  Should you have questions after your visit to [facility], please contact our office at [phone] and follow the prompts.  Our office hours are 8:00 a.m. and 4:30 p.m. Monday - Friday.  Please note that voicemails left after 4:00 p.m. may not be returned until the following business day.  We are closed weekends and major holidays.  You do have access to a nurse 24-7, just call the main number to the clinic [phone] and do not press any options, hold on the line and a nurse will answer the phone.    For prescription  refill requests, have your pharmacy contact our office and allow 72 hours.

## 2022-08-03 ENCOUNTER — Other Ambulatory Visit: Payer: Self-pay | Admitting: Family Medicine

## 2022-08-03 DIAGNOSIS — G2581 Restless legs syndrome: Secondary | ICD-10-CM

## 2022-08-04 LAB — ERYTHROPOIETIN: Erythropoietin: 13.8 m[IU]/mL (ref 2.6–18.5)

## 2022-08-09 ENCOUNTER — Ambulatory Visit (HOSPITAL_BASED_OUTPATIENT_CLINIC_OR_DEPARTMENT_OTHER): Payer: 59 | Admitting: Pulmonary Disease

## 2022-08-09 LAB — CARBON MONOXIDE, BLOOD (PERFORMED AT REF LAB): Carbon Monoxide, Blood: 4.6 % — ABNORMAL HIGH (ref 0.0–3.6)

## 2022-08-09 NOTE — Progress Notes (Signed)
Reviewed and agree with assessment/plan.   Daishaun Ayre, MD [facility] 08/09/2022, 8:22 AM Pager:  [phone]  

## 2022-08-10 LAB — CALR +MPL + E12-E15  (REFLEX)

## 2022-08-10 LAB — JAK2 V617F RFX CALR/MPL/E12-15

## 2022-08-14 ENCOUNTER — Encounter: Payer: Self-pay | Admitting: Family Medicine

## 2022-08-14 ENCOUNTER — Ambulatory Visit (INDEPENDENT_AMBULATORY_CARE_PROVIDER_SITE_OTHER): Payer: 59 | Admitting: Family Medicine

## 2022-08-14 DIAGNOSIS — J0100 Acute maxillary sinusitis, unspecified: Secondary | ICD-10-CM | POA: Diagnosis not present

## 2022-08-14 MED ORDER — PSEUDOEPHEDRINE-GUAIFENESIN ER 120-1200 MG PO TB12: 1.0000 | ORAL_TABLET | Freq: Two times a day (BID) | ORAL | 0 refills | Status: DC

## 2022-08-14 MED ORDER — AMOXICILLIN-POT CLAVULANATE 875-125 MG PO TABS: 1.0000 | ORAL_TABLET | Freq: Two times a day (BID) | ORAL | 0 refills | Status: DC

## 2022-08-14 NOTE — Progress Notes (Signed)
Subjective:    Patient ID: Jesse Castillo, male    DOB: April 21, 1966, 57 y.o.   MRN: [id number]   HPI: Jesse Castillo is a 57 y.o. male presenting for Symptoms include congestion, facial pain, eyes ache,  nasal congestion, nose burns. Productive cough, post nasal drip and sinus pressure. There is no fever, chills, or sweats. Onset of symptoms was a few weeks ago, gradually worsening since that time. No HA.        08/02/2022    1:05 PM 04/04/2022    8:59 AM 03/07/2022    8:07 AM 08/02/2021    9:46 AM 06/07/2021    9:51 AM  Depression screen PHQ 2/9  Decreased Interest 1 1 3 2 1  $ Down, Depressed, Hopeless 1 0 1 1 1  $ PHQ - 2 Score 2 1 4 3 2  $ Altered sleeping  3 3 3 3  $ Tired, decreased energy  1 3 2 1  $ Change in appetite  3 3 3 2  $ Feeling bad or failure about yourself   0 2 1 1  $ Trouble concentrating  3 3 2 2  $ Moving slowly or fidgety/restless  3 2 3 1  $ Suicidal thoughts  0 0 0 0  PHQ-9 Score  14 20 17 12  $ Difficult doing work/chores  Very difficult Very difficult  Somewhat difficult     Relevant past medical, surgical, family and social history reviewed and updated as indicated.  Interim medical history since our last visit reviewed. Allergies and medications reviewed and updated.  ROS:  Review of Systems  Constitutional:  Negative for activity change, appetite change, chills and fever.  HENT:  Positive for congestion, postnasal drip, rhinorrhea and sinus pressure. Negative for ear discharge, ear pain, hearing loss, nosebleeds, sneezing and trouble swallowing.   Respiratory:  Negative for chest tightness and shortness of breath.   Cardiovascular:  Negative for chest pain and palpitations.  Skin:  Negative for rash.     Social History   Tobacco Use  Smoking Status Former   Packs/day: 3.00   Years: 18.00   Total pack years: 54.00   Types: Cigarettes   Quit date: 07/03/2007   Years since quitting: 15.1  Smokeless Tobacco Never       Objective:     Wt Readings from Last  3 Encounters:  08/02/22 (!) 352 lb 15.3 oz (160.1 kg)  07/11/22 (!) 347 lb 9.6 oz (157.7 kg)  04/04/22 (!) 348 lb (157.9 kg)     Exam deferred. Pt. Harboring due to COVID 19. Phone visit performed.   Assessment & Plan:   1. Acute maxillary sinusitis, recurrence not specified     Meds ordered this encounter  Medications   Pseudoephedrine-Guaifenesin [id number] MG TB12    Sig: Take 1 tablet by mouth 2 (two) times daily. For congestion    Dispense:  16 tablet    Refill:  0   amoxicillin-clavulanate (AUGMENTIN) 875-125 MG tablet    Sig: Take 1 tablet by mouth 2 (two) times daily. Take all of this medication    Dispense:  20 tablet    Refill:  0    No orders of the defined types were placed in this encounter.     Diagnoses and all orders for this visit:  Acute maxillary sinusitis, recurrence not specified  Other orders -     Pseudoephedrine-Guaifenesin [id number] MG TB12; Take 1 tablet by mouth 2 (two) times daily. For congestion -     amoxicillin-clavulanate (AUGMENTIN) 875-125 MG  tablet; Take 1 tablet by mouth 2 (two) times daily. Take all of this medication    Virtual Visit via telephone Note  I discussed the limitations, risks, security and privacy concerns of performing an evaluation and management service by telephone and the availability of in person appointments. The patient was identified with two identifiers. Pt.expressed understanding and agreed to proceed. Pt. Is at home. Dr. Livia Snellen is in his office.  Follow Up Instructions:   I discussed the assessment and treatment plan with the patient. The patient was provided an opportunity to ask questions and all were answered. The patient agreed with the plan and demonstrated an understanding of the instructions.   The patient was advised to call back or seek an in-person evaluation if the symptoms worsen or if the condition fails to improve as anticipated.   Total minutes including chart review and phone contact time:  12   Follow up plan: Return if symptoms worsen or fail to improve.  Claretta Fraise, MD [place]

## 2022-08-20 ENCOUNTER — Ambulatory Visit: Payer: 59 | Attending: Pulmonary Disease | Admitting: Pulmonary Disease

## 2022-08-20 DIAGNOSIS — G4733 Obstructive sleep apnea (adult) (pediatric): Secondary | ICD-10-CM | POA: Diagnosis not present

## 2022-08-28 ENCOUNTER — Telehealth: Payer: Self-pay | Admitting: Pulmonary Disease

## 2022-08-28 DIAGNOSIS — G4733 Obstructive sleep apnea (adult) (pediatric): Secondary | ICD-10-CM

## 2022-08-28 NOTE — Telephone Encounter (Signed)
CPAP titration 08/20/22 >> CPAP 13 cm H2O >> AHI 1.9.   Please let him know he did well with CPAP at 13 cm H2O during titration study and he didn't need to use supplemental oxygen.  His oxygen level did run low when he was at lower CPAP pressure settings.  Please send an order to have his DME change his CPAP to 13 cm H2O.

## 2022-08-28 NOTE — Telephone Encounter (Signed)
ATC patient.  Left detailed vm with results on [phone] (ok per dpr)  Advised on vm to call with questions and also sent patient a Mychart message letting him know about results.  Nothing further needed at this time.

## 2022-08-28 NOTE — Procedures (Signed)
     Patient Name: Jesse Castillo, Jesse Castillo Date: 08/20/2022 Gender: Male D.O.B: 08/29/1965 Age (years): 70 Referring Provider: Chesley Mires MD, ABSM Height (inches): 71 Interpreting Physician: Chesley Mires MD, ABSM Weight (lbs): 347 RPSGT: Rosebud Poles BMI: 48 MRN: [id number] Neck Size: 20.00  CLINICAL INFORMATION The patient is referred for a CPAP titration to treat sleep apnea.  Date of HST 07/27/20: AHI 20, SpO2 low 86%.  SLEEP STUDY TECHNIQUE As per the AASM Manual for the Scoring of Sleep and Associated Events v2.3 (April 2016) with a hypopnea requiring 4% desaturations.  The channels recorded and monitored were frontal, central and occipital EEG, electrooculogram (EOG), submentalis EMG (chin), nasal and oral airflow, thoracic and abdominal wall motion, anterior tibialis EMG, snore microphone, electrocardiogram, and pulse oximetry. Continuous positive airway pressure (CPAP) was initiated at the beginning of the study and titrated to treat sleep-disordered breathing.  MEDICATIONS Medications self-administered by patient taken the night of the study : N/A  TECHNICIAN COMMENTS Comments added by technician: CPAP therapy started at 10 CWP, per doctors' order. Titiration increased to 13 CWP, due to events that were noted to increase at times. Paitient stated that he doesn't sleep in supine position. Patient tolerated CPAP therapy very well. Patient unable to maintain sleep after bathroom trip. He stated that he normally gets up at 4:30AM. PLMS increased at times during the test. Optimal pressure obtained in lateral position. No O2 supplement added due to sleep labs' protocol Comments added by scorer: N/A  RESPIRATORY PARAMETERS Optimal PAP Pressure (cm): 13 AHI at Optimal Pressure (/hr): 1.9 Overall Minimal O2 (%): 88.00 Supine % at Optimal Pressure (%): 0 Minimal O2 at Optimal Pressure (%): 88.0   SLEEP ARCHITECTURE The study was initiated at 9:51:11 PM and ended at 4:17:37  AM.  Sleep onset time was 5.8 minutes and the sleep efficiency was 70.4%. The total sleep time was 272 minutes.  The patient spent 6.80% of the night in stage N1 sleep, 61.58% in stage N2 sleep, 16.54% in stage N3 and 15.1% in REM.Stage REM latency was 161.0 minutes  Wake after sleep onset was 108.6. Alpha intrusion was absent. Supine sleep was 0.00%.  CARDIAC DATA The 2 lead EKG demonstrated sinus rhythm. The mean heart rate was 71.52 beats per minute. Other EKG findings include: None.  LEG MOVEMENT DATA The total Periodic Limb Movements of Sleep (PLMS) were 206. The PLMS index was 45.44. A PLMS index of <15 is considered normal in adults.  IMPRESSIONS - The optimal PAP pressure was 13 cm of water. - Mild oxygen desaturations were observed during this titration (min O2 = 88.00%). - He did not require supplemental oxygen during this study. - The patient snored with moderate snoring volume during this titration study.  DIAGNOSIS - Obstructive Sleep Apnea (G47.33)  RECOMMENDATIONS - Trial of CPAP therapy on 13 cm H2O with a Extra Small size Fisher[employer]Paykel Full Face Evora Full mask and heated humidification. - Avoid alcohol, sedatives and other CNS depressants that may worsen sleep apnea and disrupt normal sleep architecture. - Sleep hygiene should be reviewed to assess factors that may improve sleep quality. - Weight management and regular exercise should be initiated or continued.  [Electronically signed] 08/28/2022 02:44 PM  Chesley Mires MD, ABSM Diplomate, American Board of Sleep Medicine NPI: [id number]  [place] PH: [phone]   FX: [phone] [place]

## 2022-08-31 ENCOUNTER — Encounter (HOSPITAL_BASED_OUTPATIENT_CLINIC_OR_DEPARTMENT_OTHER): Payer: Self-pay | Admitting: Pulmonary Disease

## 2022-09-03 ENCOUNTER — Other Ambulatory Visit: Payer: Self-pay | Admitting: *Deleted

## 2022-09-03 DIAGNOSIS — G2581 Restless legs syndrome: Secondary | ICD-10-CM

## 2022-09-03 DIAGNOSIS — I10 Essential (primary) hypertension: Secondary | ICD-10-CM

## 2022-09-03 DIAGNOSIS — K219 Gastro-esophageal reflux disease without esophagitis: Secondary | ICD-10-CM

## 2022-09-03 MED ORDER — PANTOPRAZOLE SODIUM 40 MG PO TBEC: DELAYED_RELEASE_TABLET | ORAL | 0 refills | Status: DC

## 2022-09-03 MED ORDER — AMLODIPINE BESYLATE 5 MG PO TABS: ORAL_TABLET | ORAL | 0 refills | Status: DC

## 2022-09-03 MED ORDER — ROPINIROLE HCL 4 MG PO TABS: 4.0000 mg | ORAL_TABLET | Freq: Every day | ORAL | 0 refills | Status: DC

## 2022-09-04 ENCOUNTER — Ambulatory Visit (INDEPENDENT_AMBULATORY_CARE_PROVIDER_SITE_OTHER): Payer: 59 | Admitting: Pulmonary Disease

## 2022-09-04 ENCOUNTER — Encounter (HOSPITAL_BASED_OUTPATIENT_CLINIC_OR_DEPARTMENT_OTHER): Payer: Self-pay | Admitting: Pulmonary Disease

## 2022-09-04 ENCOUNTER — Ambulatory Visit (HOSPITAL_BASED_OUTPATIENT_CLINIC_OR_DEPARTMENT_OTHER): Payer: 59 | Admitting: Pulmonary Disease

## 2022-09-04 VITALS — BP 128/72 | HR 77 | Temp 97.8°F | Ht 72.0 in | Wt 358.4 lb

## 2022-09-04 DIAGNOSIS — J302 Other seasonal allergic rhinitis: Secondary | ICD-10-CM | POA: Diagnosis not present

## 2022-09-04 DIAGNOSIS — R0609 Other forms of dyspnea: Secondary | ICD-10-CM | POA: Diagnosis not present

## 2022-09-04 LAB — PULMONARY FUNCTION TEST
DL/VA % pred: 103 %
DL/VA: 4.47 ml/min/mmHg/L
DLCO cor % pred: 84 %
DLCO cor: 25.9 ml/min/mmHg
DLCO unc % pred: 90 %
DLCO unc: 27.5 ml/min/mmHg
FEF 25-75 Post: 3.92 L/sec
FEF 25-75 Pre: 4.43 L/sec
FEF2575-%Change-Post: -11 %
FEF2575-%Pred-Post: 113 %
FEF2575-%Pred-Pre: 127 %
FEV1-%Change-Post: 0 %
FEV1-%Pred-Post: 87 %
FEV1-%Pred-Pre: 88 %
FEV1-Post: 3.57 L
FEV1-Pre: 3.58 L
FEV1FVC-%Change-Post: 0 %
FEV1FVC-%Pred-Pre: 112 %
FEV6-%Change-Post: -1 %
FEV6-%Pred-Post: 80 %
FEV6-%Pred-Pre: 81 %
FEV6-Post: 4.09 L
FEV6-Pre: 4.14 L
FEV6FVC-%Pred-Post: 103 %
FEV6FVC-%Pred-Pre: 103 %
FVC-%Change-Post: -1 %
FVC-%Pred-Post: 77 %
FVC-%Pred-Pre: 78 %
FVC-Post: 4.1 L
FVC-Pre: 4.14 L
Post FEV1/FVC ratio: 87 %
Post FEV6/FVC ratio: 100 %
Pre FEV1/FVC ratio: 86 %
Pre FEV6/FVC Ratio: 100 %
RV % pred: 100 %
RV: 2.24 L
TLC % pred: 88 %
TLC: 6.54 L

## 2022-09-04 MED ORDER — FLUTICASONE PROPIONATE 50 MCG/ACT NA SUSP: 2.0000 | Freq: Every day | NASAL | 6 refills | Status: AC

## 2022-09-04 NOTE — Progress Notes (Signed)
[facility]  Chief Complaint  Patient presents with   Follow-up    PFT performed today.     Constitutional:  BP 128/72 (BP Location: Left Arm, Patient Position: Sitting, Cuff Size: Large)   Pulse 77   Temp 97.8 F (36.6 C) (Oral)   Ht 6' (1.829 m)   Wt (!) 358 lb 6.4 oz (162.6 kg)   SpO2 97% Comment: RA  BMI 48.61 kg/m   Past Medical History:  BPH, GERD, HTN, ED, Low T, COVID Pneumonia January 2022  Past Surgical History:  He  has a past surgical history that includes Hernia repair.  Brief Summary:  Jesse Castillo is a 57 y.o. male former smoker with obstructive sleep apnea and restless leg syndrome.  He has DOT license.      Subjective:   He is here with his wife.  PFT was normal.  He has sinus congestion and pressure behind his eyes.  Gets drainage and causes a cough.  Advair and albuterol didn't help before, and made him feel sick.  He still gets short of breath with activity.    He say hematology.  Carbon monoxide level was up.  He had propane leak at work, but this was fixed.  His CPAP still registers as 10 to 20 cm H2O.  He didn't need supplemental oxygen during titration study.  Physical Exam:   Appearance - well kempt   ENMT - no sinus tenderness, no oral exudate, no LAN, Mallampati 4 airway, no stridor  Respiratory - equal breath sounds bilaterally, no wheezing or rales  CV - s1s2 regular rate and rhythm, no murmurs  Ext - no clubbing, no edema  Skin - no rashes  Psych - normal mood and affect     Pulmonary Tests:  PFT 09/04/22 >> FEV1 3.58 (88%), FEV1% 86, TLC 6.54 (88%), DLCO 90%, no BD  Sleep Tests:  HST 07/27/20 >> AHI AHI 20, SpO2 low 86%. Auto CPAP 06/10/22 07/09/22 >> used on 30 of 30 nights with average 8 hrs 15 min.  Average AHI 3.5 with median CPAP 11 and 95 th percentile CPAP 13 cm H2O CPAP titration 08/20/22 >> CPAP 13 cm H2O >> AHI 1.9.   Social History:  He  reports that he quit  smoking about 15 years ago. His smoking use included cigarettes. He has a 54.00 pack-year smoking history. He has never used smokeless tobacco. He reports that he does not drink alcohol and does not use drugs.  Family History:  His family history includes Heart disease in his maternal grandmother and mother; Hypertension in his daughter; Parkinson's disease in his paternal grandmother; Tremor in his father.      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 - he is compliant with therapy and reports benefit from CPAP - he uses [employer] for his DME - current CPAP ordered on 07/29/20 - he will check with DME to get CPAP changed to 13 cm H2O  Chronic rhinitis. - likely cause of his cough - flonase bid - add nasal irrigation  Dyspnea on exertion. - likely from obesity and deconditioning - will arrange for cardiology referral to make sure he doesn't have cardiac disease contributing to his symptoms  Restless leg syndrome. - f/u with PCP  Hand tremor. - followed by Dr. Star Age with [facility]  Obesity. - discussed how his  weight can impact control of sleep apnea  Time Spent Involved in Patient Care on Day of Examination:  37 minutes  Follow up:   Patient Instructions  Try using nasal irrigation daily  Flonase 1 spray in each nostril twice daily  Check with [facility] about your CPAP settings  Will arrange for referral to cardiology  Follow up in 6 months in [place] office  Medication List:   Allergies as of 09/04/2022       Reactions   Mobic [meloxicam] Other (See Comments)   Mouth sores        Medication List        Accurate as of September 04, 2022  3:38 PM. If you have any questions, ask your nurse or doctor.          STOP taking these medications    amoxicillin-clavulanate 875-125 MG tablet Commonly known as:  AUGMENTIN Stopped by: Chesley Mires, MD       TAKE these medications    amLODipine 5 MG tablet Commonly known as: NORVASC TAKE ONE (1) TABLET BY MOUTH EVERY DAY   aspirin EC 81 MG tablet Take 81 mg by mouth daily.   BD Disp Needle 23G X 1" Misc Generic drug: NEEDLE (DISP) 23 G Use every 14 days with testosterone   diclofenac 75 MG EC tablet Commonly known as: VOLTAREN TAKE ONE TABLET ('75MG'$  TOTAL) BY MOUTH TWO TIMES DAILY   ECHINACEA C COMPLETE PO Take by mouth.   famotidine 20 MG tablet Commonly known as: PEPCID TAKE THREE TABLETS BY MOUTH EVERY NIGHT AT BEDTIME   fluticasone 50 MCG/ACT nasal spray Commonly known as: FLONASE Place 2 sprays into both nostrils daily.   HEALTHY COLON PO Take by mouth.   hydrochlorothiazide 50 MG tablet Commonly known as: HYDRODIURIL TAKE ONE TABLET ('50MG'$  TOTAL) BY MOUTH DAILY   MULTIVITAMIN MEN PO Take by mouth.   pantoprazole 40 MG tablet Commonly known as: PROTONIX TAKE ONE (1) TABLET BY MOUTH EVERY DAY   propranolol ER 60 MG 24 hr capsule Commonly known as: INDERAL LA TAKE ONE CAPSULE ('60MG'$  TOTAL) BY MOUTH DAILY   Pseudoephedrine-Guaifenesin [phone] MG Tb12 Take 1 tablet by mouth 2 (two) times daily. For congestion   rOPINIRole 4 MG tablet Commonly known as: REQUIP Take 1 tablet (4 mg total) by mouth at bedtime.   sildenafil 100 MG tablet Commonly known as: VIAGRA Take 1 tablet (100 mg total) by mouth daily as needed.   Syringe/Needle (Disp) 18G X 1" 3 ML Misc 1 Device by Does not apply route every 14 (fourteen) days.   tadalafil 10 MG tablet Commonly known as: CIALIS TAKE 1 TABLET BY MOUTH ONCE DAILY AS NEEDED FOR ERECTILE DYSFUNCTION   tadalafil 5 MG tablet Commonly known as: CIALIS Take 1 tablet (5 mg total) by mouth daily as needed for erectile dysfunction.   testosterone cypionate 200 MG/ML injection Commonly known as: DEPOTESTOSTERONE CYPIONATE Inject 0.5 mLs (100 mg total) into the muscle once a  week.   traZODone 100 MG tablet Commonly known as: DESYREL Take 1 tablet (100 mg total) by mouth at bedtime.   URINOZINC PLUS PO Take by mouth.        Signature:  Chesley Mires, MD [place] Pager - [phone] 09/04/2022, 3:38 PM

## 2022-09-04 NOTE — Patient Instructions (Signed)
Full PFT Performed Today. 

## 2022-09-04 NOTE — Patient Instructions (Signed)
Try using nasal irrigation daily  Flonase 1 spray in each nostril twice daily  Check with [facility] about your CPAP settings  Will arrange for referral to cardiology  Follow up in 6 months in [place] office

## 2022-09-04 NOTE — Telephone Encounter (Signed)
[place], Sherie Don, Raven Anner [place], [place]; [place], Danton Sewer; Mariann Barter Hello Raven,  per notes settings were changed to 13 cm H20 on 09-03-22 @ 506pm.  Thank you!  Demetrius Charity

## 2022-09-04 NOTE — Progress Notes (Signed)
Full PFT Performed Today. 

## 2022-09-04 NOTE — Telephone Encounter (Signed)
Mychart message sent by pt stating that he still has not heard from DME about having his cpap pressure changed. Order was placed 2/27. Routing to [facility] for help with this.

## 2022-09-11 ENCOUNTER — Ambulatory Visit: Payer: 59 | Admitting: Physician Assistant

## 2022-09-11 ENCOUNTER — Other Ambulatory Visit: Payer: Self-pay | Admitting: Family Medicine

## 2022-09-11 ENCOUNTER — Other Ambulatory Visit: Payer: 59

## 2022-09-11 DIAGNOSIS — K219 Gastro-esophageal reflux disease without esophagitis: Secondary | ICD-10-CM

## 2022-09-11 NOTE — Progress Notes (Unsigned)
[place], [place] [address]   CLINIC:  Medical Oncology/Hematology  PCP:  Gwenlyn Perking, [place] [address] [phone]   REASON FOR VISIT:  Follow-up for erythrocytosis  CURRENT THERAPY: Under workup  INTERVAL HISTORY:   Jesse Castillo 57 y.o. male returns for routine follow-up of his erythrocytosis.  He was seen for initial consultation by Dr. Delton Coombes and Tarri Abernethy PA-C on 08/02/2022.  At today's visit, he reports feeling somewhat poorly.  Since his last visit, he restarted testosterone injections and donated blood about 2 weeks ago.  He remains compliant with his CPAP, and reports that his pressures were increased after recent CPAP titration study.  He reports that he has had 2 episodes of left-sided chest pain with radiation down his left arm this week.  He denies any chest pain today, reports that he is seeing a cardiologist on 10/30/2022.  (He was strongly instructed at today's visit that if these symptoms happen again he should proceed immediately to the emergency department.).  He also reports worsening dyspnea on exertion.  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  He reports eye pain that feels like "headache behind the eyes."  He reports that he "feels hot at night."  No B symptoms, aquagenic pruritus, Raynaud's phenomenon, or other vasomotor symptoms.  No strokelike symptoms, dizziness, tinnitus, vision changes.   He has little to no energy and 100% appetite. He endorses that he is maintaining a stable weight.  ASSESSMENT & PLAN:  1.   Erythrocytosis - Intermittent mild elevations in hemoglobin since around the same time that he started testosterone injections in 2021 - HemoCue from 07/04/2022 showed hemoglobin 18.5 and hematocrit 53.3 - Patient has sleep apnea, compliant with CPAP but awaiting CPAP titration study - Medical history includes COPD and possible  obesity hypoventilation syndrome - Patient takes hydrochlorothiazide - He is a former smoker (quit in 2009) - No obvious carbon monoxide exposure, but works in a [employer] - Additional cardiac risk factors include obesity and hypertension.  He takes aspirin 81 mg daily. - No history of DVT, PE, CVA, or MI. - Recommended to donate blood by Dr. Jeffie Pollock (urology, which he has only done twice, most recently about 2 months ago) - Symptomatic with erythromelalgia of bilateral feet and "headache behind the eyes" for the past 3 to 4 months - Hematology workup (08/02/2022): Mildly elevated carbon monoxide at 4.6% Erythropoietin normal at 13.8 JAK2, CALR, MPL, Exon 12-15 negative - CBC/differential today (09/12/2022): Hgb 17.4/hematocrit 49.7%, otherwise normal CBC/differential - Extensive discussion today with patient regarding potential causes of erythrocytosis and the main risks of erythrocytosis and treatments thereof. 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  Additional contributing factors include sleep apnea, hydrochlorothiazide, and COPD. - PLAN: Recommend voluntary blood donation every 2 months.  Patient cautioned NOT to donate plasma. - Continue aspirin 81 mg daily. - From hematology standpoint, patient is okay to resume testosterone - Patient would like to minimize the specialist that he sees and requests discharge from hematology clinic.  This is reasonable, as long as he has good outpatient follow-up with PCP and urologist.  Recommend regular blood donation to keep hemoglobin <18.0 and hematocrit <52.0.  Recommend that PCP check CBC every 2 months.  If any new or worsening blood problems, patient can certainly be sent back to [place] as needed! - STRICT instructions on proceeding to  ED with any recurrent chest pain, and I have reached out to his cardiologist to see if his new patient consult can be moved up.  2.  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 Patient quit smoking in 2009, had previously smoked 3 PPD since age 49. He previously drank alcohol on occasion, but has not had any alcohol to drink since 2009. He denies any current or previous illicit drug use.  - FAMILY: No family history of blood disorder or myeloproliferative neoplasm.  Paternal aunt had uterine cancer.  PLAN SUMMARY: >> Tentative discharge from clinic, with instructions as noted above.  Can be scheduled on an as-needed basis in the future.     REVIEW OF SYSTEMS:   Review of Systems  Constitutional:  Positive for fatigue. Negative for appetite change, chills, diaphoresis, fever and unexpected weight change.  HENT:   Negative for lump/mass and nosebleeds.   Eyes:  Negative for eye problems.  Respiratory:  Positive for shortness of breath. Negative for cough and hemoptysis.   Cardiovascular:  Positive for chest pain (none today - reports left-sided chest pain x 2 radiating down left arm earlier this week) and leg swelling. Negative for palpitations.  Gastrointestinal:  Negative for abdominal pain, blood in stool, constipation, diarrhea, nausea and vomiting.  Genitourinary:  Positive for difficulty urinating (slow flow). Negative for hematuria.   Skin: Negative.   Neurological:  Negative for dizziness, headaches and light-headedness.  Hematological:  Does not bruise/bleed easily.  Psychiatric/Behavioral:  Positive for sleep disturbance.      PHYSICAL EXAM:  ECOG PERFORMANCE STATUS: 1 - Symptomatic but completely ambulatory  There were no vitals filed for this visit. There were no vitals filed for this visit. Physical Exam Constitutional:      Appearance: Normal appearance. He is obese.  Cardiovascular:     Heart sounds: Normal heart sounds.  Pulmonary:     Effort:  Tachypnea (22 respirations per minute) present.     Breath sounds: Normal breath sounds. Decreased air movement present.  Musculoskeletal:     Right lower leg: 2+ Edema present.     Left lower leg: 2+ Edema present.  Neurological:     General: No focal deficit present.     Mental Status: Mental status is at baseline.  Psychiatric:        Behavior: Behavior normal. Behavior is cooperative.    PAST MEDICAL/SURGICAL HISTORY:  Past Medical History:  Diagnosis Date   Enlarged prostate    GERD (gastroesophageal reflux disease)    Hypertension    Neuropathy    RLE   Pneumonia due to COVID-19 virus 07/2020   Restless leg    Sleep apnea    Tremor of left hand    Past Surgical History:  Procedure Laterality Date   HERNIA REPAIR      SOCIAL HISTORY:  Social History   Socioeconomic History   Marital status: Married    Spouse name: Not on file   Number of children: Not on file   Years of education: Not on file   Highest education level: Not on file  Occupational History   Occupation: Comptroller work for [employer]  Tobacco Use   Smoking status: Former    Packs/day: 3.00    Years: 18.00    Total pack years: 54.00    Types: Cigarettes  Quit date: 07/03/2007    Years since quitting: 15.2   Smokeless tobacco: Never  Vaping Use   Vaping Use: Never used  Substance and Sexual Activity   Alcohol use: Never   Drug use: Never   Sexual activity: Yes  Other Topics Concern   Not on file  Social History Narrative   Not on file   Social Determinants of Health   Financial Resource Strain: Not on file  Food Insecurity: Food Insecurity Present (08/02/2022)   Hunger Vital Sign    Worried About [place] in the Last Year: Sometimes true    Ran Out of Food in the Last Year: Sometimes true  Transportation Needs: No Transportation Needs (08/02/2022)   PRAPARE - Hydrologist (Medical): No    Lack of Transportation (Non-Medical): No  Physical Activity:  Not on file  Stress: Not on file  Social Connections: Not on file  Intimate Partner Violence: Not At Risk (08/02/2022)   Humiliation, Afraid, Rape, and Kick questionnaire    Fear of Current or Ex-Partner: No    Emotionally Abused: No    Physically Abused: No    Sexually Abused: No    FAMILY HISTORY:  Family History  Problem Relation Age of Onset   Heart disease Mother    Tremor Father    Hypertension Daughter    Heart disease Maternal Grandmother    Parkinson's disease Paternal Grandmother     CURRENT MEDICATIONS:  Outpatient Encounter Medications as of 09/12/2022  Medication Sig   amLODipine (NORVASC) 5 MG tablet TAKE ONE (1) TABLET BY MOUTH EVERY DAY   aspirin EC 81 MG tablet Take 81 mg by mouth daily.   diclofenac (VOLTAREN) 75 MG EC tablet TAKE ONE TABLET ('75MG'$  TOTAL) BY MOUTH TWO TIMES DAILY   famotidine (PEPCID) 20 MG tablet TAKE THREE TABLETS BY MOUTH EVERY NIGHT AT BEDTIME   fluticasone (FLONASE) 50 MCG/ACT nasal spray Place 2 sprays into both nostrils daily.   hydrochlorothiazide (HYDRODIURIL) 50 MG tablet TAKE ONE TABLET ('50MG'$  TOTAL) BY MOUTH DAILY   Misc Natural Products (URINOZINC PLUS PO) Take by mouth.   Multiple Vitamins-Minerals (MULTIVITAMIN MEN PO) Take by mouth.   NEEDLE, DISP, 23 G (BD DISP NEEDLE) 23G X 1" MISC Use every 14 days with testosterone   pantoprazole (PROTONIX) 40 MG tablet TAKE ONE (1) TABLET BY MOUTH EVERY DAY   Probiotic Product (HEALTHY COLON PO) Take by mouth.   propranolol ER (INDERAL LA) 60 MG 24 hr capsule TAKE ONE CAPSULE ('60MG'$  TOTAL) BY MOUTH DAILY   Pseudoephedrine-Guaifenesin [phone] MG TB12 Take 1 tablet by mouth 2 (two) times daily. For congestion   rOPINIRole (REQUIP) 4 MG tablet Take 1 tablet (4 mg total) by mouth at bedtime.   sildenafil (VIAGRA) 100 MG tablet Take 1 tablet (100 mg total) by mouth daily as needed.   Specialty Vitamins Products (ECHINACEA C COMPLETE PO) Take by mouth.   Syringe/Needle, Disp, 18G X 1" 3 ML MISC 1  Device by Does not apply route every 14 (fourteen) days.   tadalafil (CIALIS) 10 MG tablet TAKE 1 TABLET BY MOUTH ONCE DAILY AS NEEDED FOR ERECTILE DYSFUNCTION   tadalafil (CIALIS) 5 MG tablet Take 1 tablet (5 mg total) by mouth daily as needed for erectile dysfunction.   testosterone cypionate (DEPOTESTOSTERONE CYPIONATE) 200 MG/ML injection Inject 0.5 mLs (100 mg total) into the muscle once a week.   traZODone (DESYREL) 100 MG tablet Take 1 tablet (100 mg total)  by mouth at bedtime.   [DISCONTINUED] amoxicillin-clavulanate (AUGMENTIN) 875-125 MG tablet Take 1 tablet by mouth 2 (two) times daily. Take all of this medication   [DISCONTINUED] fluticasone (FLONASE) 50 MCG/ACT nasal spray Place 2 sprays into both nostrils daily.   [DISCONTINUED] pantoprazole (PROTONIX) 40 MG tablet TAKE ONE (1) TABLET BY MOUTH EVERY DAY   No facility-administered encounter medications on file as of 09/12/2022.    ALLERGIES:  Allergies  Allergen Reactions   Mobic [Meloxicam] Other (See Comments)    Mouth sores    LABORATORY DATA:  I have reviewed the labs as listed.  CBC    Component Value Date/Time   WBC 7.6 08/02/2022 1415   RBC 5.23 08/02/2022 1415   HGB 17.0 08/02/2022 1415   HGB 18.5 (H) 07/04/2022 0947   HCT 47.1 08/02/2022 1415   HCT 53.3 (H) 07/04/2022 0947   PLT 203 08/02/2022 1415   PLT 207 03/07/2022 0846   MCV 90.1 08/02/2022 1415   MCV 91 03/07/2022 0846   MCH 32.5 08/02/2022 1415   MCHC 36.1 (H) 08/02/2022 1415   RDW 12.3 08/02/2022 1415   RDW 13.5 03/07/2022 0846   LYMPHSABS 2.0 08/02/2022 1415   LYMPHSABS 1.8 03/07/2022 0846   MONOABS 1.2 (H) 08/02/2022 1415   EOSABS 0.1 08/02/2022 1415   EOSABS 0.1 03/07/2022 0846   BASOSABS 0.1 08/02/2022 1415   BASOSABS 0.0 03/07/2022 0846      Latest Ref Rng & Units 03/07/2022    8:46 AM 11/21/2021    2:40 PM 02/08/2021    8:19 AM  CMP  Glucose 70 - 99 mg/dL 175  79  96   BUN 6 - 24 mg/dL '18  16  14   '$ Creatinine 0.76 - 1.27 mg/dL 1.27   1.07  1.11   Sodium 134 - 144 mmol/L 139  143  140   Potassium 3.5 - 5.2 mmol/L 4.1  4.1  4.5   Chloride 96 - 106 mmol/L 103  105  101   CO2 20 - 29 mmol/L '22  23  23   '$ Calcium 8.7 - 10.2 mg/dL 9.3  9.8  9.6   Total Protein 6.0 - 8.5 g/dL 6.4  7.2  7.1   Total Bilirubin 0.0 - 1.2 mg/dL 0.6  0.5  0.8   Alkaline Phos 44 - 121 IU/L 61  64  68   AST 0 - 40 IU/L '22  25  22   '$ ALT 0 - 44 IU/L '28  30  26     '$ DIAGNOSTIC IMAGING:  I have independently reviewed the relevant imaging and discussed with the patient.   WRAP UP:  All questions were answered. The patient knows to call the clinic with any problems, questions or concerns.  Medical decision making: Moderate  Time spent on visit: I spent 20 minutes counseling the patient face to face. The total time spent in the appointment was 30 minutes and more than 50% was on counseling.  Harriett Rush, PA-C  09/12/22 3:09 PM

## 2022-09-12 ENCOUNTER — Inpatient Hospital Stay: Payer: 59 | Admitting: Physician Assistant

## 2022-09-12 ENCOUNTER — Inpatient Hospital Stay: Payer: 59 | Attending: Hematology

## 2022-09-12 ENCOUNTER — Encounter: Payer: Self-pay | Admitting: Physician Assistant

## 2022-09-12 VITALS — BP 130/78 | HR 74 | Temp 98.0°F | Resp 20 | Wt 352.0 lb

## 2022-09-12 DIAGNOSIS — D751 Secondary polycythemia: Secondary | ICD-10-CM | POA: Insufficient documentation

## 2022-09-12 DIAGNOSIS — Z79899 Other long term (current) drug therapy: Secondary | ICD-10-CM | POA: Insufficient documentation

## 2022-09-12 DIAGNOSIS — Z87891 Personal history of nicotine dependence: Secondary | ICD-10-CM | POA: Diagnosis not present

## 2022-09-12 LAB — CBC
HCT: 49.7 % (ref 39.0–52.0)
Hemoglobin: 17.4 g/dL — ABNORMAL HIGH (ref 13.0–17.0)
MCH: 32.3 pg (ref 26.0–34.0)
MCHC: 35 g/dL (ref 30.0–36.0)
MCV: 92.4 fL (ref 80.0–100.0)
Platelets: 218 10*3/uL (ref 150–400)
RBC: 5.38 MIL/uL (ref 4.22–5.81)
RDW: 14.6 % (ref 11.5–15.5)
WBC: 8.7 10*3/uL (ref 4.0–10.5)
nRBC: 0 % (ref 0.0–0.2)

## 2022-09-12 NOTE — Patient Instructions (Addendum)
[facility] [place] **   You were seen today by Dr. Delton Coombes & Tarri Abernethy PA-C for your elevated hemoglobin/red blood cells ("erythrocytosis").    ERYTHROCYTOSIS: You have had some slight elevation in your red blood cells for the past 3 years, but it has gotten slightly worse in the past few months.   Your labs did not show any genetic mutation that would have caused high red blood cells. In your case, your elevated hemoglobin is most likely related to the following conditions: Testosterone injections Sleep apnea Diuretic (hydrochlorothiazide) use Having high red blood cells can make your blood "too thick" which places you at increased risk of blood clot, heart attack, and stroke. To treat your high red blood cells, we recommend... Aspirin 81 mg daily to decrease your risk of blood clots, heart attack, and stroke. Donate blood once every 6 to 8 weeks to help bring your blood cells back to a normal level.  (Do NOT donate plasma.) From hematology standpoint, it will be okay for you to resume testosterone injections, but only if you are donating blood regularly to keep your hemoglobin <18.0 and hematocrit <52.0% Your primary care provider should check your blood levels every 2 months.  **CHEST PAIN: You have several risk factors for heart disease.  In addition to following up with your cardiologist, go IMMEDIATELY to the emergency department if you have any other episodes of chest pain, especially left sided chest pain that is radiating down your arm.  This is concerning for early signs of heart attack!  FOLLOW-UP APPOINTMENT: As long as you are continuing to follow closely with your primary care doctor and urologist for management of your blood counts, you do not need to make another appointment here at the hematology clinic [facility]).  However, if there are any new or worsening issues with your blood, please make  another appointment so that we can address this further!  ** Thank you for trusting me with your healthcare!  I strive to provide all of my patients with quality care at each visit.  If you receive a survey for this visit, I would be so grateful to you for taking the time to provide feedback.  Thank you in advance!  ~ Jesse Castillo                   Dr. Derek Jack   &   Tarri Abernethy, PA-C   - - - - - - - - - - - - - - - - - -    Thank you for choosing [facility] to provide your oncology and hematology care.  To afford each patient quality time with our provider, please arrive at least 15 minutes before your scheduled appointment time.   If you have a lab appointment with the [facility] please come in thru the Main Entrance and check in at the main information desk.  You need to re-schedule your appointment should you arrive 10 or more minutes late.  We strive to give you quality time with our providers, and arriving late affects you and other patients whose appointments are after yours.  Also, if you no show three or more times for appointments you may be dismissed from the clinic at the providers discretion.     Again, thank you for choosing [facility].  Our hope is that these requests will decrease the amount of  time that you wait before being seen by our physicians.       _____________________________________________________________  Should you have questions after your visit to [facility], please contact our office at [phone] and follow the prompts.  Our office hours are 8:00 a.m. and 4:30 p.m. Monday - Friday.  Please note that voicemails left after 4:00 p.m. may not be returned until the following business day.  We are closed weekends and major holidays.  You do have access to a nurse 24-7, just call the main number to the clinic [phone] and do not press any options, hold on the line and a nurse will  answer the phone.    For prescription refill requests, have your pharmacy contact our office and allow 72 hours.

## 2022-09-13 ENCOUNTER — Other Ambulatory Visit: Payer: Self-pay | Admitting: Family Medicine

## 2022-09-13 DIAGNOSIS — K219 Gastro-esophageal reflux disease without esophagitis: Secondary | ICD-10-CM

## 2022-09-13 DIAGNOSIS — M1990 Unspecified osteoarthritis, unspecified site: Secondary | ICD-10-CM

## 2022-09-13 DIAGNOSIS — I10 Essential (primary) hypertension: Secondary | ICD-10-CM

## 2022-09-19 ENCOUNTER — Ambulatory Visit: Payer: 59 | Admitting: Family Medicine

## 2022-09-19 ENCOUNTER — Other Ambulatory Visit: Payer: Self-pay | Admitting: Family Medicine

## 2022-09-19 VITALS — BP 137/84 | HR 80 | Temp 98.3°F | Ht 72.0 in | Wt 355.0 lb

## 2022-09-19 DIAGNOSIS — I10 Essential (primary) hypertension: Secondary | ICD-10-CM | POA: Diagnosis not present

## 2022-09-19 DIAGNOSIS — F411 Generalized anxiety disorder: Secondary | ICD-10-CM

## 2022-09-19 DIAGNOSIS — G2581 Restless legs syndrome: Secondary | ICD-10-CM | POA: Diagnosis not present

## 2022-09-19 DIAGNOSIS — G4733 Obstructive sleep apnea (adult) (pediatric): Secondary | ICD-10-CM

## 2022-09-19 DIAGNOSIS — K219 Gastro-esophageal reflux disease without esophagitis: Secondary | ICD-10-CM

## 2022-09-19 DIAGNOSIS — M5136 Other intervertebral disc degeneration, lumbar region: Secondary | ICD-10-CM

## 2022-09-19 DIAGNOSIS — F339 Major depressive disorder, recurrent, unspecified: Secondary | ICD-10-CM

## 2022-09-19 DIAGNOSIS — Z1211 Encounter for screening for malignant neoplasm of colon: Secondary | ICD-10-CM

## 2022-09-19 DIAGNOSIS — Z1159 Encounter for screening for other viral diseases: Secondary | ICD-10-CM | POA: Diagnosis not present

## 2022-09-19 DIAGNOSIS — Z0000 Encounter for general adult medical examination without abnormal findings: Secondary | ICD-10-CM

## 2022-09-19 DIAGNOSIS — D751 Secondary polycythemia: Secondary | ICD-10-CM | POA: Diagnosis not present

## 2022-09-19 DIAGNOSIS — R946 Abnormal results of thyroid function studies: Secondary | ICD-10-CM | POA: Diagnosis not present

## 2022-09-19 DIAGNOSIS — J309 Allergic rhinitis, unspecified: Secondary | ICD-10-CM

## 2022-09-19 DIAGNOSIS — Z114 Encounter for screening for human immunodeficiency virus [HIV]: Secondary | ICD-10-CM | POA: Diagnosis not present

## 2022-09-19 DIAGNOSIS — G4701 Insomnia due to medical condition: Secondary | ICD-10-CM

## 2022-09-19 DIAGNOSIS — Z6841 Body Mass Index (BMI) 40.0 and over, adult: Secondary | ICD-10-CM

## 2022-09-19 DIAGNOSIS — E782 Mixed hyperlipidemia: Secondary | ICD-10-CM

## 2022-09-19 LAB — BAYER DCA HB A1C WAIVED: HB A1C (BAYER DCA - WAIVED): 5.7 % — ABNORMAL HIGH (ref 4.8–5.6)

## 2022-09-19 MED ORDER — PANTOPRAZOLE SODIUM 40 MG PO TBEC: 40.0000 mg | DELAYED_RELEASE_TABLET | Freq: Every day | ORAL | 3 refills | Status: DC

## 2022-09-19 MED ORDER — MONTELUKAST SODIUM 10 MG PO TABS: 10.0000 mg | ORAL_TABLET | Freq: Every day | ORAL | 3 refills | Status: DC

## 2022-09-19 NOTE — Progress Notes (Unsigned)
Established Patient Office Visit  Subjective   Patient ID: Jesse Castillo, male    DOB: March 31, 1966  Age: 57 y.o. MRN: [id number]  Chief Complaint  Patient presents with   Medical Management of Chronic Issues   Hypertension    HPI Here with wife today for his follow up.   HTN Complaint with meds - Yes Current Medications - HCTZ 50 mg, amlodipine 5 mg Exercising Regularly - walks about 0.8 mile most days Pertinent ROS:  Fatigue - yes, generalized Visual Disturbances - No Chest pain - He had 1 or 2 episodes of left sided chest pain a few weeks ago Dyspnea - yes with exertion Palpitations - No LE edema - baseline  He has been referred to cardiology for evaluation. He had an appt scheduled for later this week but has rescheduled it do to the cost with his insurance.   2. Erythrocytosis He has been seeing hematology for this. 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 He was recommend to continue aspirin, donate blood every 2 months to keep hemoglobin <18 and HCT <52.   3. Sleep Recently had follow up with sleep doctor. CPAP was increased. He had normal PFTs. He takes requip for RLS with some improvement. He has failed numerous other medications for RLS. Last ferritin level was low, he has been on a daily iron supplement since. He also takes trazodone for sleep which has been helpful.   4. Allergies Reports dry cough, nasal congestion, and sneezing for weeks. He is on Zyrtec, flonase currently. He has also tried nasal saline, Claritin without improvement.   5.  GERD Compliant with medications - Yes. Protonix 40 mg, pepcid BID prn Sore throat - No Voice change - No Hemoptysis - No Dysphagia or dyspepsia - No Water brash - No Red Flags (weight loss, hematochezia, melena, weight loss, early satiety, fevers, odynophagia, or persistent vomiting) - No  6. Back pain Hx of  lumbar DDD with pain that radiates to hips. He takes voltaren prn with minimal relief. Denies changes in bowel or bladder control, saddle anesthesia, numbness, tingling. He reports relief with sitting. Pain is aggravated by bending, activity, standing. He has decline referral.   7. Anxiety/depression Denies SI. Has declined treatment or referral.      09/19/2022    3:15 PM 08/02/2022    1:05 PM 04/04/2022    8:59 AM  Depression screen PHQ 2/9  Decreased Interest 3 1 1   Down, Depressed, Hopeless 1 1 0  PHQ - 2 Score 4 2 1   Altered sleeping 3  3  Tired, decreased energy 3  1  Change in appetite 3  3  Feeling bad or failure about yourself  1  0  Trouble concentrating 2  3  Moving slowly or fidgety/restless 3  3  Suicidal thoughts 0  0  PHQ-9 Score 19  14  Difficult doing work/chores Somewhat difficult  Very difficult      09/19/2022    3:16 PM 04/04/2022    9:00 AM 03/07/2022    8:08 AM 08/02/2021    9:46 AM  GAD 7 : Generalized Anxiety Score  Nervous, Anxious, on Edge 2 2 3 3   Control/stop worrying 1 0 1 1  Worry too much - different things 1 0 1 1  Trouble relaxing 3 2 3 3   Restless 3 3 3 2   Easily annoyed or irritable 2 3 3  3  Afraid - awful might happen 1 1 2 1   Total GAD 7 Score 13 11 16 14   Anxiety Difficulty Somewhat difficult Very difficult Not difficult at all Very difficult     Past Medical History:  Diagnosis Date   Enlarged prostate    GERD (gastroesophageal reflux disease)    Hypertension    Neuropathy    RLE   Pneumonia due to COVID-19 virus 07/2020   Restless leg    Sleep apnea    Tremor of left hand       ROS As per HPI.    Objective:     BP 137/84   Pulse 80   Temp 98.3 F (36.8 C) (Temporal)   Ht 6' (1.829 m)   Wt (!) 355 lb (161 kg)   SpO2 92%   BMI 48.15 kg/m    Physical Exam Vitals and nursing note reviewed.  Constitutional:      General: He is not in acute distress.    Appearance: He is obese. He is not ill-appearing,  toxic-appearing or diaphoretic.  HENT:     Head: Normocephalic and atraumatic.     Right Ear: Tympanic membrane, ear canal and external ear normal.     Left Ear: Tympanic membrane, ear canal and external ear normal.     Nose: Congestion present.     Mouth/Throat:     Mouth: Mucous membranes are moist.     Pharynx: Oropharynx is clear.  Eyes:     General: No scleral icterus.       Right eye: No discharge.        Left eye: No discharge.     Conjunctiva/sclera: Conjunctivae normal.     Pupils: Pupils are equal, round, and reactive to light.  Cardiovascular:     Rate and Rhythm: Normal rate and regular rhythm.     Heart sounds: Normal heart sounds. No murmur heard. Pulmonary:     Effort: Pulmonary effort is normal. No respiratory distress.     Breath sounds: No wheezing or rales.  Chest:     Chest wall: No tenderness.  Abdominal:     General: Bowel sounds are normal. There is no distension.     Palpations: Abdomen is soft.     Tenderness: There is no abdominal tenderness. There is no guarding or rebound.  Musculoskeletal:     Cervical back: Neck supple. No rigidity.     Lumbar back: No swelling, edema, deformity, signs of trauma, lacerations, spasms, tenderness or bony tenderness. Normal range of motion. No scoliosis.     Right lower leg: No edema.     Left lower leg: No edema.  Skin:    General: Skin is warm and dry.  Neurological:     General: No focal deficit present.     Mental Status: He is alert and oriented to person, place, and time.  Psychiatric:        Mood and Affect: Mood normal.        Behavior: Behavior normal.      No results found for any visits on 09/19/22.    The 10-year ASCVD risk score (Arnett DK, et al., 2019) is: 9.5%    Assessment & Plan:   Jesse Castillo was seen today for medical management of chronic issues and hypertension.  Diagnoses and all orders for this visit:  Primary hypertension BP well controlled today. I agree with cardiology referral  for further evaluation given recent report chest pain, fatigue, and dyspnea on exertion. Discussed need for  urgent evaluation. Will check TSH level today.  -     TSH -     amLODipine (NORVASC) 5 MG tablet; TAKE ONE (1) TABLET BY MOUTH EVERY DAY  Morbid obesity (HCC) Diet and exercise. Will check labs as below.  -     TSH -     Bayer DCA Hb A1c Waived  Erythrocytosis Reviewed recent CBC, hematology note. Discussed will repeat CBC every 2 months and need to donate blood every 6-8 weeks.   Gastroesophageal reflux disease, unspecified whether esophagitis present Well controlled on current regimen. No red flags.  -     pantoprazole (PROTONIX) 40 MG tablet; Take 1 tablet (40 mg total) by mouth daily.  OSA on CPAP Managed by sleep medicine.   Depression, recurrent ([place]) Generalized anxiety disorder Denies SI. Declined treatment or referral.   Insomnia due to medical condition Well controlled with trazodone.   Restless leg Will recheck ferritin today. Continue requip as he has failed numerous other medications.  -     Ferritin -     rOPINIRole (REQUIP) 4 MG tablet; Take 1 tablet (4 mg total) by mouth at bedtime.  DDD (degenerative disc disease), lumbar Continue voltaren prn. Declined ortho and PT referral. No red flags.   Allergic rhinitis, unspecified seasonality, unspecified trigger Continue flonase and zyrtec. Add singular.  -     montelukast (SINGULAIR) 10 MG tablet; Take 1 tablet (10 mg total) by mouth at bedtime.  Encounter for hepatitis C screening test for low risk patient -     Hepatitis C antibody  Screening for HIV (human immunodeficiency virus) -     HIV Antibody (routine testing w rflx)  Colon cancer screening -     Cologuard  Return in about 2 months (around 11/19/2022) for CPE with fasting labs.   The patient indicates understanding of these issues and agrees with the plan.  Gwenlyn Perking, FNP

## 2022-09-19 NOTE — Patient Instructions (Signed)
Go to the follow to check on coverage for [facility]. If they say that they will cover this, I will order this and [facility] with complete the prior authorization.   Here is a guide to help [place] find out which weight loss medications will be covered by your insurance plan.  Please check out this web site  [employer] and follow the instructions.   There is also a phone number you can call if you do not have access to the Internet. Call [phone] (Monday- Friday 8am-8pm) and an associate will help navigate you.   [employer] provides coverage information for more than 80% of the inquiries submitted!!

## 2022-09-20 LAB — TSH: TSH: 7.19 u[IU]/mL — ABNORMAL HIGH (ref 0.450–4.500)

## 2022-09-20 LAB — HIV ANTIBODY (ROUTINE TESTING W REFLEX): HIV Screen 4th Generation wRfx: NONREACTIVE

## 2022-09-20 LAB — HEPATITIS C ANTIBODY: Hep C Virus Ab: NONREACTIVE

## 2022-09-20 LAB — FERRITIN: Ferritin: 124 ng/mL (ref 30–400)

## 2022-09-20 MED ORDER — ROPINIROLE HCL 4 MG PO TABS: 4.0000 mg | ORAL_TABLET | Freq: Every day | ORAL | 3 refills | Status: DC

## 2022-09-20 MED ORDER — AMLODIPINE BESYLATE 5 MG PO TABS: ORAL_TABLET | ORAL | 3 refills | Status: DC

## 2022-09-21 ENCOUNTER — Ambulatory Visit: Payer: 59 | Admitting: Internal Medicine

## 2022-09-22 LAB — SPECIMEN STATUS REPORT

## 2022-09-22 LAB — T4, FREE: Free T4: 1.05 ng/dL (ref 0.82–1.77)

## 2022-09-25 DIAGNOSIS — Z1211 Encounter for screening for malignant neoplasm of colon: Secondary | ICD-10-CM | POA: Diagnosis not present

## 2022-09-29 LAB — COLOGUARD: COLOGUARD: NEGATIVE

## 2022-10-03 ENCOUNTER — Other Ambulatory Visit: Payer: Self-pay | Admitting: Adult Health

## 2022-10-11 ENCOUNTER — Telehealth: Payer: Self-pay | Admitting: Adult Health

## 2022-10-11 MED ORDER — PROPRANOLOL HCL ER 60 MG PO CP24: 60.0000 mg | ORAL_CAPSULE | Freq: Every day | ORAL | 0 refills | Status: DC

## 2022-10-11 NOTE — Telephone Encounter (Signed)
Pt is needing a refill for his propranolol ER (INDERAL LA) 60 MG 24 hr capsule sent to the [facility] pharmacy Pt has a f/u appt scheduled.

## 2022-10-18 ENCOUNTER — Other Ambulatory Visit: Payer: Self-pay | Admitting: Urology

## 2022-10-18 DIAGNOSIS — R7989 Other specified abnormal findings of blood chemistry: Secondary | ICD-10-CM

## 2022-10-18 DIAGNOSIS — D751 Secondary polycythemia: Secondary | ICD-10-CM

## 2022-10-30 ENCOUNTER — Ambulatory Visit: Payer: 59 | Admitting: Internal Medicine

## 2022-10-31 ENCOUNTER — Ambulatory Visit: Payer: 59 | Attending: Internal Medicine | Admitting: Internal Medicine

## 2022-10-31 ENCOUNTER — Encounter: Payer: Self-pay | Admitting: Internal Medicine

## 2022-10-31 VITALS — BP 135/81 | HR 79 | Ht 72.0 in | Wt 361.2 lb

## 2022-10-31 DIAGNOSIS — R0609 Other forms of dyspnea: Secondary | ICD-10-CM

## 2022-10-31 DIAGNOSIS — I509 Heart failure, unspecified: Secondary | ICD-10-CM | POA: Insufficient documentation

## 2022-10-31 DIAGNOSIS — I10 Essential (primary) hypertension: Secondary | ICD-10-CM

## 2022-10-31 MED ORDER — POTASSIUM CHLORIDE CRYS ER 20 MEQ PO TBCR: 20.0000 meq | EXTENDED_RELEASE_TABLET | Freq: Every day | ORAL | 3 refills | Status: DC

## 2022-10-31 MED ORDER — METOPROLOL TARTRATE 100 MG PO TABS: ORAL_TABLET | ORAL | 0 refills | Status: DC

## 2022-10-31 MED ORDER — FUROSEMIDE 40 MG PO TABS: 40.0000 mg | ORAL_TABLET | Freq: Every day | ORAL | 3 refills | Status: DC

## 2022-10-31 NOTE — Patient Instructions (Addendum)
Medication Instructions:  Your physician has recommended you make the following change in your medication:   -Stop HCTZ -Start Lasix 40 mg tablet once daily -Start KCL 20 mEq tablet once daily  *If you need a refill on your cardiac medications before your next appointment, please call your pharmacy*   Lab Work: None If you have labs (blood work) drawn today and your tests are completely normal, you will receive your results only by: MyChart Message (if you have MyChart) OR A paper copy in the mail If you have any lab test that is abnormal or we need to change your treatment, we will call you to review the results.   Testing/Procedures: Your physician has requested that you have an echocardiogram. Echocardiography is a painless test that uses sound waves to create images of your heart. It provides your doctor with information about the size and shape of your heart and how well your heart's chambers and valves are working. This procedure takes approximately one hour. There are no restrictions for this procedure. Please do NOT wear cologne, perfume, aftershave, or lotions (deodorant is allowed). Please arrive 15 minutes prior to your appointment time.  Coronary CTA   Follow-Up: At [facility], you and your health needs are our priority.  As part of our continuing mission to provide you with exceptional heart care, we have created designated Provider Care Teams.  These Care Teams include your primary Cardiologist (physician) and Advanced Practice Providers (APPs -  Physician Assistants and Nurse Practitioners) who all work together to provide you with the care you need, when you need it.  We recommend signing up for the patient portal called "MyChart".  Sign up information is provided on this After Visit Summary.  MyChart is used to connect with patients for Virtual Visits (Telemedicine).  Patients are able to view lab/test results, encounter notes, upcoming appointments, etc.   Non-urgent messages can be sent to your provider as well.   To learn more about what you can do with MyChart, go to [email or web address].    Your next appointment:   3 week(s) Televisit  Provider:   Luane School, MD    Other Instructions   Your cardiac CT will be scheduled at one of the below locations:   [facility] [address] [place], [place] [address] [phone]  OR  [facility] [address] [place], [place] [address] [phone]  OR   [facility] [address] [place], [place] [address] [phone]  If scheduled at [facility], please arrive at the [facility] Entrance (Entrance C2) of [facility] 30 minutes prior to test start time. You can use the FREE valet parking offered at entrance C (encouraged to control the heart rate for the test)  Proceed to the [facility] Radiology Department (first floor) to check-in and test prep.  All radiology patients and guests should use entrance C2 at [facility], accessed from [facility], even though the hospital's physical address listed is [address].    If scheduled at [facility] or [facility], please arrive 15 mins early for check-in and test prep.   Please follow these instructions carefully (unless otherwise directed):  Hold all erectile dysfunction medications at least 3 days (72 hrs) prior to test. (Ie viagra, cialis, sildenafil, tadalafil, etc) We will administer nitroglycerin during this exam.   On the Night Before the Test:  Be sure to Drink plenty of water. Do not consume any caffeinated/decaffeinated beverages or chocolate 12 hours prior to your test. Do not take any antihistamines 12 hours prior to your test.  On the Day of the Test: Drink plenty of water until 1 hour prior to the test. Do not eat  any food 1 hour prior to test. You may take your regular medications prior to the test.  Take metoprolol (Lopressor) two hours prior to test. If you take Furosemide/Hydrochlorothiazide/Spironolactone, please HOLD on the morning of the test.      After the Test: Drink plenty of water. After receiving IV contrast, you may experience a mild flushed feeling. This is normal. On occasion, you may experience a mild rash up to 24 hours after the test. This is not dangerous. If this occurs, you can take Benadryl 25 mg and increase your fluid intake. If you experience trouble breathing, this can be serious. If it is severe call 911 IMMEDIATELY. If it is mild, please call our office. If you take any of these medications: Glipizide/Metformin, Avandament, Glucavance, please do not take 48 hours after completing test unless otherwise instructed.  We will call to schedule your test 2-4 weeks out understanding that some insurance companies will need an authorization prior to the service being performed.   For non-scheduling related questions, please contact the cardiac imaging nurse navigator should you have any questions/concerns: [place], Cardiac Imaging Nurse Navigator Larey Brick, Cardiac Imaging Nurse Navigator [facility] Direct Office Dial: [phone]   For scheduling needs, including cancellations and rescheduling, please call [place], [phone].

## 2022-10-31 NOTE — Progress Notes (Signed)
Cardiology Office Note  Date: 10/31/2022   ID: Jesse Castillo, DOB 1966/01/16, MRN [id number]  PCP:  Gabriel Earing, FNP  Cardiologist:  Marjo Bicker, MD Electrophysiologist:  None   Reason for Office Visit: DOE evaluation at the request of Dr. Craige Cotta   History of Present Illness: Jesse Castillo is a 57 y.o. male known to have HTN, OSA on CPAP was referred to cardiology clinic for evaluation of DOE.  Patient reported having DOE, orthopnea, PND, cough and bilateral lower EXTR swelling for the last 3 to 4 months. He had one-time episode of chest pain but no recurrence of chest pains. No angina, no palpitations or syncope. He quit smoking in early 2000's. He underwent LHC in the early 2000's at [facility] and was told he had 20% blockages.  His grandfather had the third transplant in the country later he passed away.  His mother had CAD.  Past Medical History:  Diagnosis Date   Enlarged prostate    GERD (gastroesophageal reflux disease)    Hypertension    Neuropathy    RLE   Pneumonia due to COVID-19 virus 07/2020   Restless leg    Sleep apnea    Tremor of left hand     Past Surgical History:  Procedure Laterality Date   HERNIA REPAIR      Current Outpatient Medications  Medication Sig Dispense Refill   amLODipine (NORVASC) 5 MG tablet TAKE ONE (1) TABLET BY MOUTH EVERY DAY 90 tablet 3   aspirin EC 81 MG tablet Take 81 mg by mouth daily.     diclofenac (VOLTAREN) 75 MG EC tablet TAKE ONE TABLET (75MG  TOTAL) BY MOUTH TWO TIMES DAILY 180 tablet 0   famotidine (PEPCID) 20 MG tablet TAKE THREE TABLETS BY MOUTH EVERY NIGHT AT BEDTIME 270 tablet 0   fluticasone (FLONASE) 50 MCG/ACT nasal spray Place 2 sprays into both nostrils daily. 16 g 6   furosemide (LASIX) 40 MG tablet Take 1 tablet (40 mg total) by mouth daily. 90 tablet 3   metoprolol tartrate (LOPRESSOR) 100 MG tablet Take 2 hours prior to CT scan 1 tablet 0   Misc Natural Products (URINOZINC PLUS PO) Take by mouth.      montelukast (SINGULAIR) 10 MG tablet Take 1 tablet (10 mg total) by mouth at bedtime. 90 tablet 3   Multiple Vitamins-Minerals (MULTIVITAMIN MEN PO) Take by mouth.     NEEDLE, DISP, 23 G (BD DISP NEEDLE) 23G X 1" MISC Use every 14 days with testosterone 50 each 1   pantoprazole (PROTONIX) 40 MG tablet Take 1 tablet (40 mg total) by mouth daily. 90 tablet 3   potassium chloride SA (KLOR-CON M) 20 MEQ tablet Take 1 tablet (20 mEq total) by mouth daily. 90 tablet 3   Probiotic Product (HEALTHY COLON PO) Take by mouth.     propranolol ER (INDERAL LA) 60 MG 24 hr capsule Take 1 capsule (60 mg total) by mouth daily. NEEDS AN APPOINTMENT FOR FURTHER REFILLS 60 capsule 0   rOPINIRole (REQUIP) 4 MG tablet Take 1 tablet (4 mg total) by mouth at bedtime. 90 tablet 3   sildenafil (VIAGRA) 100 MG tablet Take 1 tablet (100 mg total) by mouth daily as needed. 30 tablet 11   Specialty Vitamins Products (ECHINACEA C COMPLETE PO) Take by mouth.     Syringe/Needle, Disp, 18G X 1" 3 ML MISC 1 Device by Does not apply route every 14 (fourteen) days. 12 each 1  tadalafil (CIALIS) 10 MG tablet TAKE 1 TABLET BY MOUTH ONCE DAILY AS NEEDED FOR ERECTILE DYSFUNCTION 10 tablet 0   tadalafil (CIALIS) 5 MG tablet Take 1 tablet (5 mg total) by mouth daily as needed for erectile dysfunction. 30 tablet 11   testosterone cypionate (DEPOTESTOSTERONE CYPIONATE) 200 MG/ML injection INJECT 0.5ML'S (100MG  TOTAL) INTO THE MUSCLE ONCE A WEEK 10 mL 1   traZODone (DESYREL) 100 MG tablet Take 1 tablet (100 mg total) by mouth at bedtime. 90 tablet 1   No current facility-administered medications for this visit.   Allergies:  Mobic [meloxicam]   Social History: The patient  reports that he quit smoking about 15 years ago. His smoking use included cigarettes. He has a 54.00 pack-year smoking history. He has never used smokeless tobacco. He reports that he does not drink alcohol and does not use drugs.   Family History: The patient's  family history includes Heart disease in his maternal grandmother and mother; Hypertension in his daughter; Parkinson's disease in his paternal grandmother; Tremor in his father.   ROS:  Please see the history of present illness. Otherwise, complete review of systems is positive for none.  All other systems are reviewed and negative.   Physical Exam: VS:  BP 135/81 (BP Location: Left Arm, Patient Position: Sitting, Cuff Size: Normal)   Pulse 79   Ht 6' (1.829 m)   Wt (!) 361 lb 3.2 oz (163.8 kg)   SpO2 96%   BMI 48.99 kg/m , BMI Body mass index is 48.99 kg/m.  Wt Readings from Last 3 Encounters:  10/31/22 (!) 361 lb 3.2 oz (163.8 kg)  09/19/22 (!) 355 lb (161 kg)  09/12/22 (!) 352 lb (159.7 kg)    General: Patient appears comfortable at rest. HEENT: Conjunctiva and lids normal, oropharynx clear with moist mucosa. Neck: Supple, JVD not examined due to body habitus Lungs: Clear to auscultation, nonlabored breathing at rest. Cardiac: Regular rate and rhythm, no S3 or significant systolic murmur, no pericardial rub. Abdomen: Soft, nontender, no hepatomegaly, bowel sounds present, no guarding or rebound. Extremities: 1+ pitting edema, distal pulses 2+. Skin: Warm and dry. Musculoskeletal: No kyphosis. Neuropsychiatric: Alert and oriented x3, affect grossly appropriate.  Recent Labwork: 03/07/2022: ALT 28; AST 22; BUN 18; Creatinine, Ser 1.27; Potassium 4.1; Sodium 139 09/12/2022: Hemoglobin 17.4; Platelets 218 09/19/2022: TSH 7.190     Component Value Date/Time   CHOL 178 11/21/2021 1440   TRIG 388 (H) 11/21/2021 1440   HDL 29 (L) 11/21/2021 1440   CHOLHDL 6.1 (H) 11/21/2021 1440   LDLCALC 85 11/21/2021 1440    Other Studies Reviewed Today:   Assessment and Plan: Patient is a 57 year old M known to have HTN, OSA on CPAP was referred to cardiology clinic for evaluation of DOE.  # DOE likely secondary to new onset heart failure exacerbation -Patient has symptoms of DOE,  orthopnea, PND, cough and bilateral lower EXTR swelling x 3 to 4 months.  Discontinue HCTZ, start p.o. Lasix 40 mg once daily which I instructed him to take twice daily if his symptoms of DOE does not get better in a few days.  Start potassium supplements 20 mg once a day which will need to be increased to twice a day according to the Lasix regimen. He works and returns back home at 4 PM, he prefers taking it at 4 PM as he has to walk for 10 minutes at his work to go to [employer]. -Obtain 2D echocardiogram and CTA cardiac to rule  out significant CAD  # HTN: Discontinue HCTZ, continue amlodipine 5 mg once daily and continue GDMT as stated above. # OSA on CPAP: Continue CPAP   I have spent a total of 45 minutes with patient reviewing chart, EKGs, labs and examining patient as well as establishing an assessment and plan that was discussed with the patient.  > 50% of time was spent in direct patient care.    Medication Adjustments/Labs and Tests Ordered: Current medicines are reviewed at length with the patient today.  Concerns regarding medicines are outlined above.   Tests Ordered: Orders Placed This Encounter  Procedures   CT CORONARY MORPH W/CTA COR W/SCORE W/CA W/CM &/OR WO/CM   EKG 12-Lead   ECHOCARDIOGRAM COMPLETE    Medication Changes: Meds ordered this encounter  Medications   furosemide (LASIX) 40 MG tablet    Sig: Take 1 tablet (40 mg total) by mouth daily.    Dispense:  90 tablet    Refill:  3   potassium chloride SA (KLOR-CON M) 20 MEQ tablet    Sig: Take 1 tablet (20 mEq total) by mouth daily.    Dispense:  90 tablet    Refill:  3   metoprolol tartrate (LOPRESSOR) 100 MG tablet    Sig: Take 2 hours prior to CT scan    Dispense:  1 tablet    Refill:  0    Disposition:  Follow up  3 weeks Tele visit (patient wants to check if his televisit is covered by insurance)  Signed, Navjot Loera Verne Spurr, MD, 10/31/2022 3:25 PM    [facility] at  [facility] [address] [place], [place] [address]

## 2022-11-01 NOTE — Progress Notes (Signed)
Reviewed and agree with assessment/plan.   Wen Merced, MD [facility] 11/01/2022, 8:36 AM Pager:  [phone]  

## 2022-11-05 ENCOUNTER — Other Ambulatory Visit: Payer: Self-pay | Admitting: Family Medicine

## 2022-11-05 DIAGNOSIS — G4701 Insomnia due to medical condition: Secondary | ICD-10-CM

## 2022-11-05 NOTE — Telephone Encounter (Signed)
Last office visit 09/19/22 Last refill 04/05/23 #90, 1 refill

## 2022-11-08 ENCOUNTER — Encounter: Payer: Self-pay | Admitting: Internal Medicine

## 2022-11-09 ENCOUNTER — Other Ambulatory Visit: Payer: Self-pay

## 2022-11-09 DIAGNOSIS — I509 Heart failure, unspecified: Secondary | ICD-10-CM

## 2022-11-09 MED ORDER — FUROSEMIDE 80 MG PO TABS: 80.0000 mg | ORAL_TABLET | Freq: Two times a day (BID) | ORAL | 3 refills | Status: DC

## 2022-11-09 NOTE — Addendum Note (Signed)
Addended by: Leonides Schanz C on: 11/09/2022 03:10 PM   Modules accepted: Orders

## 2022-11-13 ENCOUNTER — Telehealth (INDEPENDENT_AMBULATORY_CARE_PROVIDER_SITE_OTHER): Payer: 59 | Admitting: Neurology

## 2022-11-13 DIAGNOSIS — R251 Tremor, unspecified: Secondary | ICD-10-CM

## 2022-11-13 MED ORDER — PROPRANOLOL HCL ER 60 MG PO CP24: 60.0000 mg | ORAL_CAPSULE | Freq: Every day | ORAL | 5 refills | Status: DC

## 2022-11-13 NOTE — Patient Instructions (Signed)
We can continue with propranolol long-acting once daily.  For your upcoming coronary CT scan, please check with your cardiologist if you should stop the propranolol as you are taking a different beta-blocker for that scan, called metoprolol.  Please reduce your caffeine intake, limit yourself to about 2 servings per day, total of 16 ounce per day.  Try to hydrate well with water.  Please follow-up with your primary care, if she is able to continue with your propranolol prescription, you can get further refills from her and follow-up with [place] as needed.  Otherwise, you can also follow-up in 1 year to see [name], NP.

## 2022-11-13 NOTE — Progress Notes (Signed)
Interim history:   Jesse Castillo is a 57 year old right-handed gentleman with an underlying complex medical history of congestive heart failure, low testosterone, hypertension, COPD, neuropathy, restless leg syndrome, sleep apnea (on PAP therapy), BPH, and severe obesity with a BMI of over 45, who presents for a MyChart Video visit follow-up consultation of his hand tremors.  The patient is unaccompanied today and joins via iPad tablet from home. I am located in my office at [facility] using my work Health and safety inspector.   I first met him on 08/22/2021, at which time he reported a longstanding history of at least 20 years of hand tremors particularly in the left hand.  He had tried propranolol before.  He had tried gabapentin.  He was advised to restart a cautious trial of propranolol up to 20 mg twice daily.   Today, 11/13/2022: He reports feeling fairly stable, has ongoing issues with intermittent tremors, tremor gets worse when he is nervous.  He has an upcoming cardiac CT scan and has to take metoprolol for the test but does not take it daily.  He tolerates the propranolol LA, 60 mg once daily.  He would like a refill, he is going to see his primary care next month and is agreeable to asking her if she would be willing to continue with the prescription in the future.  He has not noticed any new neurological symptoms.  Of note, he drinks quite a bit of caffeine in the form of unsweet tea, 2 32 ounce servings per day on average.  Previously:   He saw [name], NP on 02/13/2022, at which time he was on propranolol 20 mg twice daily without side effects.  He reported some improvement in his tremors.  He was advised to change to Inderal LA 60 mg daily and follow-up with his sleep specialist regarding his restless leg symptoms.  He was on ropinirole 4 mg nightly at the time.  08/22/21 (SA): (He) reports a longstanding history of a left hand tremor of over 20 years.  He has previously been treated for this with  propranolol, as he recalls he was given 20 mg once daily and it did not help.  I reviewed your office note from 06/07/2021.  Gabapentin has not been helpful for his tremor.  He then tried propranolol again in low-dose and it did not help.     He denies any tremor in the right hand or in the legs.  He has not fallen recently. He reports no family history of tremors with the exception of paternal grandmother who had Parkinson's disease. He drinks caffeine in the form of soda occasionally and 1 cup of coffee per day on average, he admits that he does not always hydrate very well but tries to get 3-4 bottles of water per day.  He quit smoking in 2009.  He currently does not consume any alcohol.  He sleeps fairly well but does endorse restless leg symptoms, his sleep specialist is Dr. Craige Cotta and he had a recent new CPAP machine issued.  He reports compliance with his CPAP.   He had a remote brain MRI with and without contrast on 04/26/2002 and I reviewed the results: IMPRESSION:   UNREMARKABLE HEAD AND PITUITARY GLAND.  THERE IS NO EVIDENCE OF A PITUITARY MASS.   For anxiety and depression he has been on Celexa.  He reports side effects particularly sexual side effects from taking Celexa.  He was on Prozac before that.  He also has a prescription for  hydroxyzine.     His Past Medical History Is Significant For: Past Medical History:  Diagnosis Date   Enlarged prostate    GERD (gastroesophageal reflux disease)    Hypertension    Neuropathy    RLE   Pneumonia due to COVID-19 virus 07/2020   Restless leg    Sleep apnea    Tremor of left hand     His Past Surgical History Is Significant For: Past Surgical History:  Procedure Laterality Date   HERNIA REPAIR      His Family History Is Significant For: Family History  Problem Relation Age of Onset   Heart disease Mother    Tremor Father    Hypertension Daughter    Heart disease Maternal Grandmother    Parkinson's disease Paternal Grandmother      His Social History Is Significant For: Social History   Socioeconomic History   Marital status: Married    Spouse name: Not on file   Number of children: Not on file   Years of education: Not on file   Highest education level: 12th grade  Occupational History   Occupation: Dentist work for [employer]  Tobacco Use   Smoking status: Former    Packs/day: 3.00    Years: 18.00    Additional pack years: 0.00    Total pack years: 54.00    Types: Cigarettes    Quit date: 07/03/2007    Years since quitting: 15.3   Smokeless tobacco: Never  Vaping Use   Vaping Use: Never used  Substance and Sexual Activity   Alcohol use: Never   Drug use: Never   Sexual activity: Yes  Other Topics Concern   Not on file  Social History Narrative   Not on file   Social Determinants of Health   Financial Resource Strain: Low Risk  (09/19/2022)   Overall Financial Resource Strain (CARDIA)    Difficulty of Paying Living Expenses: Not very hard  Food Insecurity: Food Insecurity Present (09/19/2022)   Hunger Vital Sign    Worried About Running Out of Food in the Last Year: Sometimes true    Ran Out of Food in the Last Year: Sometimes true  Transportation Needs: No Transportation Needs (09/19/2022)   PRAPARE - Administrator, Civil Service (Medical): No    Lack of Transportation (Non-Medical): No  Physical Activity: Insufficiently Active (09/19/2022)   Exercise Vital Sign    Days of Exercise per Week: 5 days    Minutes of Exercise per Session: 10 min  Stress: Stress Concern Present (09/19/2022)   [name] of Occupational Health - Occupational Stress Questionnaire    Feeling of Stress : To some extent  Social Connections: Moderately Integrated (09/19/2022)   Social Connection and Isolation Panel [NHANES]    Frequency of Communication with Friends and Family: More than three times a week    Frequency of Social Gatherings with Friends and Family: Once a week    Attends Religious  Services: 1 to 4 times per year    Active Member of [employer] or Organizations: No    Attends Engineer, structural: Not on file    Marital Status: Married    His Allergies Are:  Allergies  Allergen Reactions   Mobic [Meloxicam] Other (See Comments)    Mouth sores  :   His Current Medications Are:  Outpatient Encounter Medications as of 11/13/2022  Medication Sig   amLODipine (NORVASC) 5 MG tablet TAKE ONE (1) TABLET BY MOUTH EVERY DAY  aspirin EC 81 MG tablet Take 81 mg by mouth daily.   diclofenac (VOLTAREN) 75 MG EC tablet TAKE ONE TABLET (75MG  TOTAL) BY MOUTH TWO TIMES DAILY   famotidine (PEPCID) 20 MG tablet TAKE THREE TABLETS BY MOUTH EVERY NIGHT AT BEDTIME   fluticasone (FLONASE) 50 MCG/ACT nasal spray Place 2 sprays into both nostrils daily.   furosemide (LASIX) 80 MG tablet Take 1 tablet (80 mg total) by mouth 2 (two) times daily.   metoprolol tartrate (LOPRESSOR) 100 MG tablet Take 2 hours prior to CT scan   Misc Natural Products (URINOZINC PLUS PO) Take by mouth.   montelukast (SINGULAIR) 10 MG tablet Take 1 tablet (10 mg total) by mouth at bedtime.   Multiple Vitamins-Minerals (MULTIVITAMIN MEN PO) Take by mouth.   NEEDLE, DISP, 23 G (BD DISP NEEDLE) 23G X 1" MISC Use every 14 days with testosterone   pantoprazole (PROTONIX) 40 MG tablet Take 1 tablet (40 mg total) by mouth daily.   potassium chloride SA (KLOR-CON M) 20 MEQ tablet Take 1 tablet (20 mEq total) by mouth daily.   Probiotic Product (HEALTHY COLON PO) Take by mouth.   propranolol ER (INDERAL LA) 60 MG 24 hr capsule Take 1 capsule (60 mg total) by mouth daily. NEEDS AN APPOINTMENT FOR FURTHER REFILLS   rOPINIRole (REQUIP) 4 MG tablet Take 1 tablet (4 mg total) by mouth at bedtime.   sildenafil (VIAGRA) 100 MG tablet Take 1 tablet (100 mg total) by mouth daily as needed.   Specialty Vitamins Products (ECHINACEA C COMPLETE PO) Take by mouth.   Syringe/Needle, Disp, 18G X 1" 3 ML MISC 1 Device by Does not  apply route every 14 (fourteen) days.   tadalafil (CIALIS) 10 MG tablet TAKE 1 TABLET BY MOUTH ONCE DAILY AS NEEDED FOR ERECTILE DYSFUNCTION   tadalafil (CIALIS) 5 MG tablet Take 1 tablet (5 mg total) by mouth daily as needed for erectile dysfunction.   testosterone cypionate (DEPOTESTOSTERONE CYPIONATE) 200 MG/ML injection INJECT 0.5ML'S (100MG  TOTAL) INTO THE MUSCLE ONCE A WEEK   traZODone (DESYREL) 100 MG tablet TAKE ONE TABLET (100MG  TOTAL) BY MOUTH AT BEDTIME   No facility-administered encounter medications on file as of 11/13/2022.  :  Review of Systems:  Out of a complete 14 point review of systems, all are reviewed and negative with the exception of these symptoms as listed below:  Virtual Visit via Video Note on @ TODAY@  I connected withNAME@ on 11/13/22 at 10:45 AM EDT by a video enabled telemedicine application and verified that I am speaking with the correct person using two identifiers.   I discussed the limitations of evaluation and management by telemedicine and the availability of in person appointments. The patient expressed understanding and agreed to proceed.  History of Present Illness: See above.   Observations/Objective:   Vital signs per patient from today: Blood pressure 123/86, pulse 73, O2 sats 96 on room air, temperature 98, weight from this morning 351 pounds. Pleasant conversant, no acute distress, face is symmetric, no head or neck or facial tremor.  Speech is clear without dysarthria, hypophonia or voice tremor.  Corrective eyeglasses in place, hearing grossly intact, upper body movements normal, walks without a walking aid, no limp noted. With outstretched hands, minimal postural tremor noted, mild action tremor left more than right upper extremity, no intention tremor.  Hand movements and finger taps without decrement in amplitude.  Assessment and Plan: In summary, Jesse Castillo is a very pleasant 57 y.o.-year old male  with an underlying complex medical  history of congestive heart failure, low testosterone, hypertension, COPD, neuropathy, restless leg syndrome, sleep apnea (on PAP therapy), BPH, and severe obesity with a BMI of over 45, who presents for a MyChart Video visit follow-up consultation of his hand tremors.  He feels fairly stable, has been able to tolerate Inderal LA generic 60 mg once daily.  Examination is limited to video exam and shows no significant resting tremor, no worsening tremor, rather mild postural and action tremor, no intention tremor.  He feels that the Inderal has been helpful, he requested a refill today which I was happy to provide.  If he feels stable and is able to get further refills from his primary care, he is encouraged to do so.  Otherwise, he can certainly follow-up in 1 year for a recheck to see [name], NP.  He is advised to scale back on his unsweet tea intake as caffeine can trigger tremors.  I answered all his questions today and he was in agreement.  Follow Up Instructions:    I discussed the assessment and treatment plan with the patient. The patient was provided an opportunity to ask questions and all were answered. The patient agreed with the plan and demonstrated an understanding of the instructions.   The patient was advised to call back or seek an in-person evaluation if the symptoms worsen or if the condition fails to improve as anticipated.    I provided 20 minutes of non-face-to-face time during this encounter.    Huston Foley, MD

## 2022-11-14 ENCOUNTER — Ambulatory Visit: Payer: 59 | Admitting: Family Medicine

## 2022-11-14 ENCOUNTER — Encounter: Payer: Self-pay | Admitting: Family Medicine

## 2022-11-14 VITALS — BP 124/80 | HR 70 | Temp 97.7°F | Ht 72.0 in | Wt 358.2 lb

## 2022-11-14 DIAGNOSIS — G629 Polyneuropathy, unspecified: Secondary | ICD-10-CM

## 2022-11-14 DIAGNOSIS — R202 Paresthesia of skin: Secondary | ICD-10-CM

## 2022-11-14 MED ORDER — PREGABALIN 50 MG PO CAPS
ORAL_CAPSULE | ORAL | 0 refills | Status: DC
Start: 2022-11-14 — End: 2023-01-28

## 2022-11-14 NOTE — Progress Notes (Signed)
Subjective:  Patient ID: Jesse Castillo, male    DOB: 1966-02-04  Age: 57 y.o. MRN: [id number]  CC: FEET BURNING (BILATERAL X 2 MONTHS)   HPI Jesse Castillo presents for onset 2 months ago of burning in his feet. He had no known injury or illness preceding this. He was told 10 years ago he had neuropathy, but it never caued a problem. Pt. Is borderline diabetic with recent A1c of 5.7. The burning seems worse when still. Gets better when he puts his feet down. Awakens him at night. Has not tried treatment for this previously. He has parkinsonism and restless leg syndrome for which he takes ropinirole 4 mg qhs. The legs are not weak. They are not numb. He has large varicosities LLE. RLE. Burning pain is 7/10.     11/14/2022    8:24 AM 11/14/2022    8:20 AM 09/19/2022    3:15 PM  Depression screen PHQ 2/9  Decreased Interest 2 0 3  Down, Depressed, Hopeless 1 0 1  PHQ - 2 Score 3 0 4  Altered sleeping 2  3  Tired, decreased energy 2  3  Change in appetite 2  3  Feeling bad or failure about yourself  1  1  Trouble concentrating 2  2  Moving slowly or fidgety/restless 2  3  Suicidal thoughts 1  0  PHQ-9 Score 15  19  Difficult doing work/chores Very difficult  Somewhat difficult    History Jesse Castillo has a past medical history of Enlarged prostate, GERD (gastroesophageal reflux disease), Hypertension, Neuropathy, Pneumonia due to COVID-19 virus (07/2020), Restless leg, Sleep apnea, and Tremor of left hand.   He has a past surgical history that includes Hernia repair.   His family history includes Heart disease in his maternal grandmother and mother; Hypertension in his daughter; Parkinson's disease in his paternal grandmother; Tremor in his father.He reports that he quit smoking about 15 years ago. His smoking use included cigarettes. He has a 54.00 pack-year smoking history. He has never used smokeless tobacco. He reports that he does not drink alcohol and does not use drugs.    ROS Review  of Systems  Constitutional:  Negative for fever.  Respiratory:  Negative for shortness of breath.   Cardiovascular:  Negative for chest pain.  Musculoskeletal:  Negative for arthralgias.  Skin:  Negative for rash.  Neurological:  Positive for tremors. Negative for syncope, weakness and numbness.    Objective:  BP 124/80   Pulse 70   Temp 97.7 F (36.5 C)   Ht 6' (1.829 m)   Wt (!) 358 lb 3.2 oz (162.5 kg)   SpO2 96%   BMI 48.58 kg/m   BP Readings from Last 3 Encounters:  11/14/22 124/80  10/31/22 135/81  09/19/22 137/84    Wt Readings from Last 3 Encounters:  11/14/22 (!) 358 lb 3.2 oz (162.5 kg)  10/31/22 (!) 361 lb 3.2 oz (163.8 kg)  09/19/22 (!) 355 lb (161 kg)     Physical Exam Vitals reviewed.  Constitutional:      Appearance: He is well-developed.  HENT:     Head: Normocephalic and atraumatic.     Right Ear: External ear normal.     Left Ear: External ear normal.     Mouth/Throat:     Pharynx: No oropharyngeal exudate or posterior oropharyngeal erythema.  Eyes:     Pupils: Pupils are equal, round, and reactive to light.  Cardiovascular:     Rate and  Rhythm: Normal rate and regular rhythm.     Heart sounds: No murmur heard. Pulmonary:     Effort: No respiratory distress.     Breath sounds: Normal breath sounds.  Musculoskeletal:     Cervical back: Normal range of motion and neck supple.  Neurological:     Mental Status: He is alert and oriented to person, place, and time.     Sensory: No sensory deficit (grossly intact BLE for light touch at the toes, ball of feet, plantar outstep and dorsal midfoot.).       Assessment & Plan:   Jesse Castillo was seen today for feet burning.  Diagnoses and all orders for this visit:  Neuropathy -     Ambulatory referral to Neurology  Paresthesias  Other orders -     pregabalin (LYRICA) 50 MG capsule; 1 qhs X7 days , then 2 qhs X 7d, then 3 qhs X 7d, then 4 qhs       I am having Jesse Castillo. Jesse Castillo start on  pregabalin. I am also having him maintain his Misc Natural Products (URINOZINC PLUS PO), aspirin EC, Specialty Vitamins Products (ECHINACEA C COMPLETE PO), Probiotic Product (HEALTHY COLON PO), Multiple Vitamins-Minerals (MULTIVITAMIN MEN PO), BD Disp Needle, Syringe/Needle (Disp), tadalafil, tadalafil, sildenafil, fluticasone, diclofenac, famotidine, montelukast, pantoprazole, amLODipine, rOPINIRole, testosterone cypionate, potassium chloride SA, metoprolol tartrate, traZODone, furosemide, and propranolol ER.  Allergies as of 11/14/2022       Reactions   Mobic [meloxicam] Other (See Comments)   Mouth sores        Medication List        Accurate as of Nov 14, 2022 11:43 AM. If you have any questions, ask your nurse or doctor.          amLODipine 5 MG tablet Commonly known as: NORVASC TAKE ONE (1) TABLET BY MOUTH EVERY DAY   aspirin EC 81 MG tablet Take 81 mg by mouth daily.   BD Disp Needle 23G X 1" Misc Generic drug: NEEDLE (DISP) 23 G Use every 14 days with testosterone   diclofenac 75 MG EC tablet Commonly known as: VOLTAREN TAKE ONE TABLET (75MG  TOTAL) BY MOUTH TWO TIMES DAILY   ECHINACEA C COMPLETE PO Take by mouth.   famotidine 20 MG tablet Commonly known as: PEPCID TAKE THREE TABLETS BY MOUTH EVERY NIGHT AT BEDTIME   fluticasone 50 MCG/ACT nasal spray Commonly known as: FLONASE Place 2 sprays into both nostrils daily.   furosemide 80 MG tablet Commonly known as: LASIX Take 1 tablet (80 mg total) by mouth 2 (two) times daily.   HEALTHY COLON PO Take by mouth.   metoprolol tartrate 100 MG tablet Commonly known as: LOPRESSOR Take 2 hours prior to CT scan   montelukast 10 MG tablet Commonly known as: SINGULAIR Take 1 tablet (10 mg total) by mouth at bedtime.   MULTIVITAMIN MEN PO Take by mouth.   pantoprazole 40 MG tablet Commonly known as: PROTONIX Take 1 tablet (40 mg total) by mouth daily.   potassium chloride SA 20 MEQ tablet Commonly known  as: KLOR-CON M Take 1 tablet (20 mEq total) by mouth daily.   pregabalin 50 MG capsule Commonly known as: Lyrica 1 qhs X7 days , then 2 qhs X 7d, then 3 qhs X 7d, then 4 qhs Started by: Jesse Claude, MD   propranolol ER 60 MG 24 hr capsule Commonly known as: INDERAL LA Take 1 capsule (60 mg total) by mouth daily.   rOPINIRole 4 MG tablet Commonly  known as: REQUIP Take 1 tablet (4 mg total) by mouth at bedtime.   sildenafil 100 MG tablet Commonly known as: VIAGRA Take 1 tablet (100 mg total) by mouth daily as needed.   Syringe/Needle (Disp) 18G X 1" 3 ML Misc 1 Device by Does not apply route every 14 (fourteen) days.   tadalafil 10 MG tablet Commonly known as: CIALIS TAKE 1 TABLET BY MOUTH ONCE DAILY AS NEEDED FOR ERECTILE DYSFUNCTION   tadalafil 5 MG tablet Commonly known as: CIALIS Take 1 tablet (5 mg total) by mouth daily as needed for erectile dysfunction.   testosterone cypionate 200 MG/ML injection Commonly known as: DEPOTESTOSTERONE CYPIONATE INJECT 0.5ML'S (100MG  TOTAL) INTO THE MUSCLE ONCE A WEEK   traZODone 100 MG tablet Commonly known as: DESYREL TAKE ONE TABLET (100MG  TOTAL) BY MOUTH AT BEDTIME   URINOZINC PLUS PO Take by mouth.         Follow-up: Return in about 1 month (around 12/15/2022), or neuropathy, with PCP.  Jesse Castillo, M.D.

## 2022-11-19 ENCOUNTER — Telehealth: Payer: Self-pay

## 2022-11-19 NOTE — Telephone Encounter (Signed)
Jesse Castillo (Key: [id number]) PA Case ID #: [id number] Rx #: [id number] Need Help? Call us at [phone] Status sent iconSent to Plan today Drug Pregabalin 50MG  capsules ePA cloud Psychologist, educational Electronic PA Form [id number] NCPDP) Original Claim Info [address] [place], [place] REQR [id number] 02DRUG REQUIRES PRIOR AUTHORIZATION 03(

## 2022-12-02 ENCOUNTER — Other Ambulatory Visit (HOSPITAL_COMMUNITY): Payer: Self-pay

## 2022-12-02 NOTE — Telephone Encounter (Signed)
Pharmacy Patient Advocate Encounter  Received notification from [facility] that the request for prior authorization for Pregabalin 50MG  capsules has been denied due to Your plan only covers this drug when you have tried another drug that your plan covers (preferred drug) and it did not work well for you. We reviewed the information we had. You do not meet these conditions. Your request has been denied.    Please be advised we currently do not have a Pharmacist to review denials, therefore you will need to process appeals accordingly as needed. Thanks for your support at this time.   You may call [phone] or fax [phone] to appeal.

## 2022-12-04 ENCOUNTER — Ambulatory Visit (HOSPITAL_COMMUNITY)
Admission: RE | Admit: 2022-12-04 | Discharge: 2022-12-04 | Disposition: A | Discharge status: Home / Self Care | Payer: 59 | Source: Ambulatory Visit | Attending: Internal Medicine | Admitting: Internal Medicine

## 2022-12-04 DIAGNOSIS — R0609 Other forms of dyspnea: Secondary | ICD-10-CM | POA: Insufficient documentation

## 2022-12-04 LAB — ECHOCARDIOGRAM COMPLETE
Area-P 1/2: 2.91 cm2
S' Lateral: 3.4 cm

## 2022-12-04 MED ORDER — PERFLUTREN LIPID MICROSPHERE
1.0000 mL | INTRAVENOUS | Status: AC | PRN
  Administered 2022-12-04: 4 mL via INTRAVENOUS

## 2022-12-04 NOTE — Progress Notes (Signed)
*  PRELIMINARY RESULTS* Echocardiogram 2D Echocardiogram has been performed with [facility].  Stacey Drain 12/04/2022, 4:22 PM

## 2022-12-07 ENCOUNTER — Telehealth: Payer: 59 | Admitting: Internal Medicine

## 2022-12-10 ENCOUNTER — Telehealth (HOSPITAL_COMMUNITY): Payer: Self-pay | Admitting: Emergency Medicine

## 2022-12-10 NOTE — Telephone Encounter (Signed)
Reaching out to patient to offer assistance regarding upcoming cardiac imaging study; pt verbalizes understanding of appt date/time, parking situation and where to check in, pre-test NPO status and medications ordered, and verified current allergies; name and call back number provided for further questions should they arise Yulonda Wheeling RN Navigator Cardiac Imaging [facility] [phone] office [phone] cell 

## 2022-12-11 ENCOUNTER — Ambulatory Visit (HOSPITAL_COMMUNITY)
Admission: RE | Admit: 2022-12-11 | Discharge: 2022-12-11 | Disposition: A | Discharge status: Home / Self Care | Payer: 59 | Source: Ambulatory Visit | Attending: Internal Medicine | Admitting: Internal Medicine

## 2022-12-11 ENCOUNTER — Other Ambulatory Visit: Payer: Self-pay | Admitting: Cardiology

## 2022-12-11 ENCOUNTER — Ambulatory Visit (HOSPITAL_BASED_OUTPATIENT_CLINIC_OR_DEPARTMENT_OTHER)
Admission: RE | Admit: 2022-12-11 | Discharge: 2022-12-11 | Disposition: A | Discharge status: Home / Self Care | Payer: 59 | Source: Ambulatory Visit | Attending: Cardiology | Admitting: Cardiology

## 2022-12-11 DIAGNOSIS — I2510 Atherosclerotic heart disease of native coronary artery without angina pectoris: Secondary | ICD-10-CM

## 2022-12-11 DIAGNOSIS — R0609 Other forms of dyspnea: Secondary | ICD-10-CM | POA: Insufficient documentation

## 2022-12-11 DIAGNOSIS — R931 Abnormal findings on diagnostic imaging of heart and coronary circulation: Secondary | ICD-10-CM | POA: Diagnosis not present

## 2022-12-11 MED ORDER — NITROGLYCERIN 0.4 MG SL SUBL
SUBLINGUAL_TABLET | SUBLINGUAL | Status: AC
  Filled 2022-12-11: qty 2

## 2022-12-11 MED ORDER — IOHEXOL 350 MG/ML SOLN
115.0000 mL | Freq: Once | INTRAVENOUS | Status: AC | PRN
  Administered 2022-12-11: 115 mL via INTRAVENOUS

## 2022-12-11 MED ORDER — METOPROLOL TARTRATE 5 MG/5ML IV SOLN
INTRAVENOUS | Status: AC
  Filled 2022-12-11: qty 10

## 2022-12-11 MED ORDER — NITROGLYCERIN 0.4 MG SL SUBL
0.8000 mg | SUBLINGUAL_TABLET | Freq: Once | SUBLINGUAL | Status: AC
  Administered 2022-12-11: 0.8 mg via SUBLINGUAL

## 2022-12-11 MED ORDER — METOPROLOL TARTRATE 5 MG/5ML IV SOLN
10.0000 mg | Freq: Once | INTRAVENOUS | Status: AC
  Administered 2022-12-11: 10 mg via INTRAVENOUS

## 2022-12-12 ENCOUNTER — Ambulatory Visit: Payer: 59 | Admitting: Family Medicine

## 2022-12-13 ENCOUNTER — Other Ambulatory Visit: Payer: Self-pay | Admitting: Family Medicine

## 2022-12-13 DIAGNOSIS — K219 Gastro-esophageal reflux disease without esophagitis: Secondary | ICD-10-CM

## 2022-12-13 DIAGNOSIS — M1990 Unspecified osteoarthritis, unspecified site: Secondary | ICD-10-CM

## 2022-12-14 ENCOUNTER — Ambulatory Visit: Payer: 59 | Attending: Internal Medicine | Admitting: Internal Medicine

## 2022-12-14 ENCOUNTER — Telehealth: Payer: Self-pay | Admitting: *Deleted

## 2022-12-14 ENCOUNTER — Encounter: Payer: Self-pay | Admitting: Internal Medicine

## 2022-12-14 VITALS — BP 136/92 | HR 77 | Ht 71.0 in | Wt 347.0 lb

## 2022-12-14 DIAGNOSIS — R0609 Other forms of dyspnea: Secondary | ICD-10-CM | POA: Diagnosis not present

## 2022-12-14 DIAGNOSIS — E118 Type 2 diabetes mellitus with unspecified complications: Secondary | ICD-10-CM

## 2022-12-14 DIAGNOSIS — J984 Other disorders of lung: Secondary | ICD-10-CM | POA: Diagnosis not present

## 2022-12-14 DIAGNOSIS — E669 Obesity, unspecified: Secondary | ICD-10-CM

## 2022-12-14 NOTE — Patient Instructions (Signed)
Medication Instructions:  Your physician recommends that you continue on your current medications as directed. Please refer to the Current Medication list given to you today.   Labwork: None today  Testing/Procedures: None today  Follow-Up:  6 months  Any Other Special Instructions Will Be Listed Below (If Applicable).   You have been referred to PharmD for management of Reginal Lutes  You have been referred to Nutrition    If you need a refill on your cardiac medications before your next appointment, please call your pharmacy.

## 2022-12-14 NOTE — Telephone Encounter (Signed)
  Patient Consent for Virtual Visit        DEVARIO TISON has provided verbal consent on 12/14/2022 for a virtual visit (video or telephone).   CONSENT FOR VIRTUAL VISIT FOR:  [employer]  By participating in this virtual visit I agree to the following:  I hereby voluntarily request, consent and authorize [facility] and its employed or contracted physicians, physician assistants, nurse practitioners or other licensed health care professionals (the Practitioner), to provide me with telemedicine health care services (the "Services") as deemed necessary by the treating Practitioner. I acknowledge and consent to receive the Services by the Practitioner via telemedicine. I understand that the telemedicine visit will involve communicating with the Practitioner through live audiovisual communication technology and the disclosure of certain medical information by electronic transmission. I acknowledge that I have been given the opportunity to request an in-person assessment or other available alternative prior to the telemedicine visit and am voluntarily participating in the telemedicine visit.  I understand that I have the right to withhold or withdraw my consent to the use of telemedicine in the course of my care at any time, without affecting my right to future care or treatment, and that the Practitioner or I may terminate the telemedicine visit at any time. I understand that I have the right to inspect all information obtained and/or recorded in the course of the telemedicine visit and may receive copies of available information for a reasonable fee.  I understand that some of the potential risks of receiving the Services via telemedicine include:  Delay or interruption in medical evaluation due to technological equipment failure or disruption; Information transmitted may not be sufficient (e.g. poor resolution of images) to allow for appropriate medical decision making by the Practitioner;  and/or  In rare instances, security protocols could fail, causing a breach of personal health information.  Furthermore, I acknowledge that it is my responsibility to provide information about my medical history, conditions and care that is complete and accurate to the best of my ability. I acknowledge that Practitioner's advice, recommendations, and/or decision may be based on factors not within their control, such as incomplete or inaccurate data provided by me or distortions of diagnostic images or specimens that may result from electronic transmissions. I understand that the practice of medicine is not an exact science and that Practitioner makes no warranties or guarantees regarding treatment outcomes. I acknowledge that a copy of this consent can be made available to me via my patient portal [facility] MyChart), or I can request a printed copy by calling the office of [facility].    I understand that my insurance will be billed for this visit.   I have read or had this consent read to me. I understand the contents of this consent, which adequately explains the benefits and risks of the Services being provided via telemedicine.  I have been provided ample opportunity to ask questions regarding this consent and the Services and have had my questions answered to my satisfaction. I give my informed consent for the services to be provided through the use of telemedicine in my medical care

## 2022-12-14 NOTE — Progress Notes (Signed)
Virtual Visit via Telephone Note   Because of Jesse Castillo's co-morbid illnesses, he is at least at moderate risk for complications without adequate follow up.  This format is felt to be most appropriate for this patient at this time.  The patient did not have access to video technology/had technical difficulties with video requiring transitioning to audio format only (telephone).  All issues noted in this document were discussed and addressed.  No physical exam could be performed with this format.  Please refer to the patient's chart for his consent to telehealth for [facility].    Date:  12/14/2022   ID:  Jesse Castillo, DOB Mar 13, 1966, MRN [id number] The patient was identified using 2 identifiers.  Patient Location: Other:  Home Provider Location: Office/Clinic   PCP:  Gabriel Earing, FNP   [facility] Providers Cardiologist:  Marjo Bicker, MD     Evaluation Performed:  Follow-Up Visit  Chief Complaint:  DOE  History of Present Illness:    Jesse Castillo is a 57 y.o. male with HTN, OSA on CPAP is here for follow-up visit of DOE.  Patient was referred to cardiology clinic in 10/2022 due to symptoms of DOE, orthopnea, PND, cough and bilateral lower EXTR swelling x 4 to 5 months (since January 2024).  He was started on p.o. Lasix 40 mg once daily which was increased to 80 mg twice daily with very minimal relief in his symptoms of SOB.  He is able to breathe better in the daytime but at night, when he lays on his back, he said he is not able to breathe at all even with CPAP.  But he was noticing that he was urinating more than normal and losing weight.  No angina.  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 He also underwent a new sleep study and follows up with the sleep medicine.  Echocardiogram from 6/24 showed normal LVEF, normal diastology and no valve abnormalities. CT cardiac from 6/24 showed coronary calcium  score of 202, mild to moderate LAD disease (50 to 69%, negative FFR), mild mid LCx stenosis (25 to 49%, negative FFR).   Past Medical History:  Diagnosis Date   Enlarged prostate    GERD (gastroesophageal reflux disease)    Hypertension    Neuropathy    RLE   Pneumonia due to COVID-19 virus 07/2020   Restless leg    Sleep apnea    Tremor of left hand    Past Surgical History:  Procedure Laterality Date   HERNIA REPAIR       Current Meds  Medication Sig   amLODipine (NORVASC) 5 MG tablet TAKE ONE (1) TABLET BY MOUTH EVERY DAY   aspirin EC 81 MG tablet Take 81 mg by mouth daily.   diclofenac (VOLTAREN) 75 MG EC tablet TAKE ONE TABLET (75MG  TOTAL) BY MOUTH TWO TIMES DAILY   famotidine (PEPCID) 20 MG tablet TAKE THREE TABLETS BY MOUTH EVERY NIGHT AT BEDTIME   fluticasone (FLONASE) 50 MCG/ACT nasal spray Place 2 sprays into both nostrils daily.   furosemide (LASIX) 80 MG tablet Take 1 tablet (80 mg total) by mouth 2 (two) times daily.   Misc Natural Products (URINOZINC PLUS PO) Take by mouth.   montelukast (SINGULAIR) 10 MG tablet Take 1 tablet (10 mg total) by mouth at bedtime.   Multiple Vitamins-Minerals (MULTIVITAMIN MEN PO) Take by mouth.   NEEDLE, DISP, 23 G (BD DISP NEEDLE) 23G X  1" MISC Use every 14 days with testosterone   pantoprazole (PROTONIX) 40 MG tablet Take 1 tablet (40 mg total) by mouth daily.   potassium chloride SA (KLOR-CON M) 20 MEQ tablet Take 1 tablet (20 mEq total) by mouth daily.   pregabalin (LYRICA) 50 MG capsule 1 qhs X7 days , then 2 qhs X 7d, then 3 qhs X 7d, then 4 qhs   Probiotic Product (HEALTHY COLON PO) Take by mouth.   propranolol ER (INDERAL LA) 60 MG 24 hr capsule Take 1 capsule (60 mg total) by mouth daily.   rOPINIRole (REQUIP) 4 MG tablet Take 1 tablet (4 mg total) by mouth at bedtime.   sildenafil (VIAGRA) 100 MG tablet Take 1 tablet (100 mg total) by mouth daily as needed.   Specialty Vitamins Products (ECHINACEA C COMPLETE PO) Take by  mouth.   Syringe/Needle, Disp, 18G X 1" 3 ML MISC 1 Device by Does not apply route every 14 (fourteen) days.   tadalafil (CIALIS) 10 MG tablet TAKE 1 TABLET BY MOUTH ONCE DAILY AS NEEDED FOR ERECTILE DYSFUNCTION   tadalafil (CIALIS) 5 MG tablet Take 1 tablet (5 mg total) by mouth daily as needed for erectile dysfunction.   testosterone cypionate (DEPOTESTOSTERONE CYPIONATE) 200 MG/ML injection INJECT 0.5ML'S (100MG  TOTAL) INTO THE MUSCLE ONCE A WEEK   traZODone (DESYREL) 100 MG tablet TAKE ONE TABLET (100MG  TOTAL) BY MOUTH AT BEDTIME     Allergies:   Mobic [meloxicam]   Social History   Tobacco Use   Smoking status: Former    Packs/day: 3.00    Years: 18.00    Additional pack years: 0.00    Total pack years: 54.00    Types: Cigarettes    Quit date: 07/03/2007    Years since quitting: 15.4   Smokeless tobacco: Never  Vaping Use   Vaping Use: Never used  Substance Use Topics   Alcohol use: Never   Drug use: Never     Family Hx: The patient's family history includes Heart disease in his maternal grandmother and mother; Hypertension in his daughter; Parkinson's disease in his paternal grandmother; Tremor in his father.  ROS:   Please see the history of present illness.    All other systems reviewed and are negative.   Prior CV studies:   The following studies were reviewed today:    Labs/Other Tests and Data Reviewed:    Recent Labs: 03/07/2022: ALT 28; BUN 18; Creatinine, Ser 1.27; Potassium 4.1; Sodium 139 09/12/2022: Hemoglobin 17.4; Platelets 218 09/19/2022: TSH 7.190   Recent Lipid Panel Lab Results  Component Value Date/Time   CHOL 178 11/21/2021 02:40 PM   TRIG 388 (H) 11/21/2021 02:40 PM   HDL 29 (L) 11/21/2021 02:40 PM   CHOLHDL 6.1 (H) 11/21/2021 02:40 PM   LDLCALC 85 11/21/2021 02:40 PM    Wt Readings from Last 3 Encounters:  12/14/22 (!) 347 lb (157.4 kg)  11/14/22 (!) 358 lb 3.2 oz (162.5 kg)  10/31/22 (!) 361 lb 3.2 oz (163.8 kg)     Risk  Assessment/Calculations:          Objective:    Vital Signs:  BP (!) 136/92   Pulse 77   Ht 5\' 11"  (1.803 m)   Wt (!) 347 lb (157.4 kg)   BMI 48.40 kg/m     ASSESSMENT & PLAN:    Patient is a 57 year old M known to have HTN, OSA on CPAP is here for follow-up visit.  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 Lasix 80 mg twice daily with minimal improvement in his symptoms of SOB.  He has no SOB in upright position but has significant shortness of breath when he lays on his back at night.  Even CPAP at night is not helping him.  Echocardiogram showed normal LVEF, normal diastology and no valvular heart disease. 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 On chart review, patient was 270 in 2020 and now 347 lbs. He does not have a weighing scale at home and instructed him to check his weights every day. Will place Pharm.D. referral for [facility] initiation. Otherwise, will place referral to nutrition to help with healthy food choices. Sleep with the head end of the bed raised at night to help with the SOB symptoms.  # Moderate CAD with no FFR -No symptoms of angina.  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  No need of stress testing at this point.  # HTN -Continue amlodipine 5 mg once daily, Lasix 80 mg twice daily.  After initiating Wegovy, will need to reduce the dose of Lasix.  # OSA on CPAP -Follows up with sleep medicine, has a new sleep study ordered.           Time:   Today, I have spent 15 minutes with the patient with telehealth technology discussing the above problems.     Medication Adjustments/Labs  and Tests Ordered: Current medicines are reviewed at length with the patient today.  Concerns regarding medicines are outlined above.   Tests Ordered: Orders Placed This Encounter  Procedures   AMB Referral to [facility] Pharm-D    Medication Changes: No orders of the defined types were placed in this encounter.   Follow Up:  In Person  6 months  Signed, Marjo Bicker, MD  12/14/2022 10:52 AM     [facility]

## 2022-12-18 ENCOUNTER — Encounter: Payer: Self-pay | Admitting: General Surgery

## 2022-12-18 ENCOUNTER — Ambulatory Visit: Payer: 59 | Admitting: General Surgery

## 2022-12-18 VITALS — BP 112/76 | HR 68 | Temp 97.4°F | Resp 18 | Ht 71.0 in | Wt 360.0 lb

## 2022-12-18 DIAGNOSIS — K432 Incisional hernia without obstruction or gangrene: Secondary | ICD-10-CM

## 2022-12-18 NOTE — Progress Notes (Signed)
Jesse Castillo; [id number]; 04/28/1966   HPI Patient is a 57 year old white male who was referred to my care by [facility] for evaluation and treatment of an umbilical hernia.  Patient states he has had intermittent pain in the umbilicus for some time now.  He also is status post a midline upper ventral hernia repair in the remote past.  He has gained a significant amount of weight since that surgery.  He states that he was having pain in that area for many weeks, but over the past 2 weeks he has not had a significant amount of pain.  He denies any nausea or vomiting.  He is being seen by both cardiology and pulmonology for dyspnea and shortness of breath.  He does have sleep apnea and uses a machine in the evening. Past Medical History:  Diagnosis Date   Enlarged prostate    GERD (gastroesophageal reflux disease)    Hypertension    Neuropathy    RLE   Pneumonia due to COVID-19 virus 07/2020   Restless leg    Sleep apnea    Tremor of left hand     Past Surgical History:  Procedure Laterality Date   HERNIA REPAIR      Family History  Problem Relation Age of Onset   Heart disease Mother    Tremor Father    Hypertension Daughter    Heart disease Maternal Grandmother    Parkinson's disease Paternal Grandmother     Current Outpatient Medications on File Prior to Visit  Medication Sig Dispense Refill   amLODipine (NORVASC) 5 MG tablet TAKE ONE (1) TABLET BY MOUTH EVERY DAY 90 tablet 3   aspirin EC 81 MG tablet Take 81 mg by mouth daily.     diclofenac (VOLTAREN) 75 MG EC tablet TAKE ONE TABLET (75MG  TOTAL) BY MOUTH TWO TIMES DAILY 180 tablet 0   famotidine (PEPCID) 20 MG tablet TAKE THREE TABLETS BY MOUTH EVERY NIGHT AT BEDTIME 270 tablet 0   fluticasone (FLONASE) 50 MCG/ACT nasal spray Place 2 sprays into both nostrils daily. 16 g 6   furosemide (LASIX) 80 MG tablet Take 1 tablet (80 mg total) by mouth 2 (two) times daily. 180 tablet 3   Misc  Natural Products (URINOZINC PLUS PO) Take by mouth.     montelukast (SINGULAIR) 10 MG tablet Take 1 tablet (10 mg total) by mouth at bedtime. 90 tablet 3   Multiple Vitamins-Minerals (MULTIVITAMIN MEN PO) Take by mouth.     NEEDLE, DISP, 23 G (BD DISP NEEDLE) 23G X 1" MISC Use every 14 days with testosterone 50 each 1   pantoprazole (PROTONIX) 40 MG tablet Take 1 tablet (40 mg total) by mouth daily. 90 tablet 3   potassium chloride SA (KLOR-CON M) 20 MEQ tablet Take 1 tablet (20 mEq total) by mouth daily. 90 tablet 3   pregabalin (LYRICA) 50 MG capsule 1 qhs X7 days , then 2 qhs X 7d, then 3 qhs X 7d, then 4 qhs 120 capsule 0   Probiotic Product (HEALTHY COLON PO) Take by mouth.     propranolol ER (INDERAL LA) 60 MG 24 hr capsule Take 1 capsule (60 mg total) by mouth daily. 30 capsule 5   rOPINIRole (REQUIP) 4 MG tablet Take 1 tablet (4 mg total) by mouth at bedtime. 90 tablet 3   sildenafil (VIAGRA) 100 MG tablet Take 1 tablet (100 mg total) by mouth daily as needed. 30 tablet 11   Specialty  Vitamins Products (ECHINACEA C COMPLETE PO) Take by mouth.     Syringe/Needle, Disp, 18G X 1" 3 ML MISC 1 Device by Does not apply route every 14 (fourteen) days. 12 each 1   tadalafil (CIALIS) 10 MG tablet TAKE 1 TABLET BY MOUTH ONCE DAILY AS NEEDED FOR ERECTILE DYSFUNCTION 10 tablet 0   tadalafil (CIALIS) 5 MG tablet Take 1 tablet (5 mg total) by mouth daily as needed for erectile dysfunction. 30 tablet 11   testosterone cypionate (DEPOTESTOSTERONE CYPIONATE) 200 MG/ML injection INJECT 0.5ML'S (100MG  TOTAL) INTO THE MUSCLE ONCE A WEEK 10 mL 1   traZODone (DESYREL) 100 MG tablet TAKE ONE TABLET (100MG  TOTAL) BY MOUTH AT BEDTIME 90 tablet 3   No current facility-administered medications on file prior to visit.    Allergies  Allergen Reactions   Mobic [Meloxicam] Other (See Comments)    Mouth sores    Social History   Substance and Sexual Activity  Alcohol Use Never    Social History   Tobacco  Use  Smoking Status Former   Packs/day: 3.00   Years: 18.00   Additional pack years: 0.00   Total pack years: 54.00   Types: Cigarettes   Quit date: 07/03/2007   Years since quitting: 15.4  Smokeless Tobacco Never    Review of Systems  Constitutional:  Positive for malaise/fatigue.  HENT:  Positive for sinus pain.   Eyes:  Positive for blurred vision and pain.  Respiratory:  Positive for cough and shortness of breath.   Cardiovascular: Negative.   Gastrointestinal:  Positive for abdominal pain and heartburn.  Skin: Negative.   Neurological: Negative.   Endo/Heme/Allergies: Negative.   Psychiatric/Behavioral: Negative.      Objective   Vitals:   12/18/22 1306  BP: 112/76  Pulse: 68  Resp: 18  Temp: (!) 97.4 F (36.3 C)  SpO2: 95%    Physical Exam Vitals reviewed.  Constitutional:      Appearance: Normal appearance. He is obese. He is not ill-appearing.  HENT:     Head: Normocephalic and atraumatic.  Cardiovascular:     Rate and Rhythm: Normal rate and regular rhythm.     Heart sounds: Normal heart sounds. No murmur heard.    No friction rub. No gallop.  Pulmonary:     Effort: Pulmonary effort is normal. No respiratory distress.     Breath sounds: Normal breath sounds. No stridor. No wheezing, rhonchi or rales.  Abdominal:     General: Bowel sounds are normal. There is no distension.     Palpations: Abdomen is soft. There is no mass.     Tenderness: There is no abdominal tenderness. There is no guarding or rebound.     Hernia: A hernia is present.     Comments: Patient has a small reducible 1.5 cm umbilical hernia present.  He also has an upper midline scar with a 4 cm hernia present that is also easily reducible.  Skin:    General: Skin is warm and dry.  Neurological:     Mental Status: He is alert and oriented to person, place, and time.   Previous cardiology and pulmonology notes reviewed  Assessment  Umbilical hernia, incisional hernia.  Both are  reducible and are not currently symptomatic. Plan  I told the patient that I would not fix the hernias at the present time as he is primarily asymptomatic.  He understands and agrees.  He is being started on weight reduction therapy.  Should they become more prominent  in the future or become painful and incarcerated, he was instructed to call my office.  Literature was given.  Follow-up here as needed.

## 2022-12-26 ENCOUNTER — Ambulatory Visit: Payer: 59 | Admitting: Neurology

## 2023-01-02 ENCOUNTER — Other Ambulatory Visit: Payer: 59

## 2023-01-10 ENCOUNTER — Ambulatory Visit: Payer: 59 | Admitting: Urology

## 2023-01-16 ENCOUNTER — Encounter: Payer: 59 | Admitting: Family Medicine

## 2023-01-28 ENCOUNTER — Encounter: Payer: Self-pay | Admitting: Internal Medicine

## 2023-01-28 ENCOUNTER — Ambulatory Visit: Payer: 59 | Admitting: Internal Medicine

## 2023-01-28 VITALS — BP 134/87 | HR 72 | Ht 71.5 in | Wt 356.0 lb

## 2023-01-28 DIAGNOSIS — R739 Hyperglycemia, unspecified: Secondary | ICD-10-CM | POA: Diagnosis not present

## 2023-01-28 DIAGNOSIS — Z13220 Encounter for screening for lipoid disorders: Secondary | ICD-10-CM

## 2023-01-28 DIAGNOSIS — K219 Gastro-esophageal reflux disease without esophagitis: Secondary | ICD-10-CM | POA: Diagnosis not present

## 2023-01-28 DIAGNOSIS — D751 Secondary polycythemia: Secondary | ICD-10-CM | POA: Diagnosis not present

## 2023-01-28 DIAGNOSIS — R7303 Prediabetes: Secondary | ICD-10-CM | POA: Diagnosis not present

## 2023-01-28 DIAGNOSIS — I509 Heart failure, unspecified: Secondary | ICD-10-CM

## 2023-01-28 LAB — POCT GLYCOSYLATED HEMOGLOBIN (HGB A1C): Hemoglobin A1C: 6.5 % — AB (ref 4.0–5.6)

## 2023-01-28 MED ORDER — GABAPENTIN 300 MG PO CAPS: 300.0000 mg | ORAL_CAPSULE | Freq: Three times a day (TID) | ORAL | 0 refills | Status: DC

## 2023-01-28 MED ORDER — FAMOTIDINE 40 MG PO TABS
ORAL_TABLET | ORAL | 0 refills | Status: DC
Start: 2023-01-28 — End: 2023-03-06

## 2023-01-28 MED ORDER — PANTOPRAZOLE SODIUM 40 MG PO TBEC: 40.0000 mg | DELAYED_RELEASE_TABLET | Freq: Two times a day (BID) | ORAL | 0 refills | Status: DC

## 2023-01-28 NOTE — Progress Notes (Unsigned)
HPI:Jesse Castillo is a 57 y.o. 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 For the details of today's visit, please refer to the assessment and plan.  HTN Patient's BP today is 134/87 with a goal of <140/80. The patient endorses adherence to amlodipine 5 mg. Also on propanol 60 mg started for essential tremor.   Morbid Obesity BMI is 48.97. Patient follows with pulmonology . His workup for shortness of breath concluded he has restrictive lung disease secondary to obesity.   GERD Patient taking protonix and pepcid for GERD. Needs refill of medications. Symptoms uncontrolled  HFpEF Following with cardiolgoy. Recent workup for DOE. Normal EF, no valve abnormalities on 6/4. Moderate CAD based on CT coronary 6/11.   Essential Tremor Taking propanolol 60 mg. Symptom stable  Hypogonadism on TRT with complication of secondary polycythemia. Following with urology who prescribes TRT. He had workup with oncology who concluded this was from combination of TRT, hydrochlorothiazide, and sleep apnea. On CPAP and hydrochlorothiazide has been stopped.   Insomnia Controlled on trazadone 100 mg nightly.  Restless leg syndrome Was on iron supplement when Ferritin 54. Reviewed and repeat ferritin 124 four months ago He is on ropinirole .   Peripheral neuropathy. Pain from neuropathy uncontrolled. He was on pregablin and did not find benefit. He is scheduled with neurology for further workup.   BPH Follows with urology. Symptoms improved with Cialis    Past Medical History:  Diagnosis Date   Enlarged prostate    GERD (gastroesophageal reflux disease)    Hypertension    Neuropathy    RLE   Pneumonia due to COVID-19 virus 07/2020   Restless  leg    Sleep apnea    Tremor of left hand     Past Surgical History:  Procedure Laterality Date   HERNIA REPAIR      Family History  Problem Relation Age of Onset   Heart disease Mother    Tremor Father    Hypertension Daughter    Heart disease Maternal Grandmother    Parkinson's disease Paternal Grandmother     Social History   Tobacco Use   Smoking status: Former    Current packs/day: 0.00    Average packs/day: 3.0 packs/day for 18.0 years (54.0 ttl pk-yrs)    Types: Cigarettes    Start date: 07/02/1989    Quit date: 07/03/2007    Years since quitting: 15.5   Smokeless tobacco: Never  Vaping Use   Vaping status: Never Used  Substance Use Topics   Alcohol use: Never   Drug use: Never     Physical Exam: Vitals:   01/28/23 0907  BP: 134/87  Pulse: 72  SpO2: 94%  Weight: (!) 356 lb 0.6 oz (161.5 kg)  Height: 5' 11.5" (1.816 m)     Physical Exam Constitutional:      General: He is not in acute distress.    Appearance: He is morbidly obese. He is not ill-appearing.  Cardiovascular:     Rate and Rhythm: Regular rhythm.     Heart sounds: No murmur heard. Pulmonary:     Breath sounds: No wheezing or rales.  Musculoskeletal:     Right lower leg: No edema.     Left lower leg: No edema.  Assessment & Plan:   Congestive heart failure (CHF) (HCC) Chronic problem, stable Patient on Lasix Check BMP and Mg  Gastroesophageal reflux disease Chronic problem , uncontrolled on Protonix one daily and pepcid Take Protonix 40 mg BID Pepcid 40 mg daily Encouraged weight loss If patient symptoms remain uncontrolled he will need further evaluation with EGD.  Erythrocytosis Chronic problem, stable Check CBC Will recommend phlebotomy if Hgb is elevated  Prediabetes History of prediabetes Check POC Hgb A1c  Addendum: A1c elevated, 6.5. Will start Ozempic for T2DM Follow up in one month   Screening for hyperlipidemia Morbidly obese Check lipid panel      Milus Banister, MD

## 2023-01-28 NOTE — Patient Instructions (Signed)
Thank you, [name] for allowing [place] to provide your care today.   I have ordered the following labs for you:   Lab Orders         CBC with Differential         Magnesium         Lipid Profile         CMP14+EGFR      Needs POC A1c.    Reminders: Follow up in 6 weeks.     Thurmon Fair, M.D.

## 2023-01-29 ENCOUNTER — Ambulatory Visit: Payer: 59 | Admitting: Internal Medicine

## 2023-01-30 DIAGNOSIS — R7303 Prediabetes: Secondary | ICD-10-CM | POA: Insufficient documentation

## 2023-01-30 DIAGNOSIS — R739 Hyperglycemia, unspecified: Secondary | ICD-10-CM | POA: Insufficient documentation

## 2023-01-30 DIAGNOSIS — Z13220 Encounter for screening for lipoid disorders: Secondary | ICD-10-CM | POA: Insufficient documentation

## 2023-01-30 NOTE — Assessment & Plan Note (Signed)
Morbidly obese Check lipid panel

## 2023-01-30 NOTE — Assessment & Plan Note (Signed)
Chronic problem , uncontrolled on Protonix one daily and pepcid Take Protonix 40 mg BID Pepcid 40 mg daily Encouraged weight loss If patient symptoms remain uncontrolled he will need further evaluation with EGD.

## 2023-01-30 NOTE — Assessment & Plan Note (Signed)
History of prediabetes Check POC Hgb A1c  Addendum: A1c elevated, 6.5. Will start Ozempic for T2DM Follow up in one month

## 2023-01-30 NOTE — Assessment & Plan Note (Signed)
Chronic problem, stable Patient on Lasix Check BMP and Mg

## 2023-01-30 NOTE — Assessment & Plan Note (Signed)
Chronic problem, stable Check CBC Will recommend phlebotomy if Hgb is elevated

## 2023-02-13 ENCOUNTER — Ambulatory Visit (INDEPENDENT_AMBULATORY_CARE_PROVIDER_SITE_OTHER): Payer: 59 | Admitting: Pulmonary Disease

## 2023-02-13 ENCOUNTER — Encounter: Payer: Self-pay | Admitting: Pulmonary Disease

## 2023-02-13 VITALS — BP 127/80 | HR 78 | Ht 71.5 in | Wt 360.0 lb

## 2023-02-13 DIAGNOSIS — E669 Obesity, unspecified: Secondary | ICD-10-CM | POA: Diagnosis not present

## 2023-02-13 DIAGNOSIS — G4730 Sleep apnea, unspecified: Secondary | ICD-10-CM

## 2023-02-13 DIAGNOSIS — G4733 Obstructive sleep apnea (adult) (pediatric): Secondary | ICD-10-CM | POA: Diagnosis not present

## 2023-02-13 NOTE — Patient Instructions (Signed)
Your sleep apnea number from your sleep study was 20 events per hour of sleep.  Follow up in 1 year.

## 2023-02-13 NOTE — Progress Notes (Signed)
[facility]  Chief Complaint  Patient presents with   Shortness of Breath   Sleep Apnea   Constitutional:  BP 127/80   Pulse 78   Ht 5' 11.5" (1.816 m)   Wt (!) 360 lb (163.3 kg)   SpO2 95%   BMI 49.51 kg/m   Past Medical History:  BPH, GERD, HTN, ED, Low T, COVID Pneumonia January 2022  Past Surgical History:  He  has a past surgical history that includes Hernia repair.  Brief Summary:  Jesse Castillo is a 57 y.o. male former smoker with obstructive sleep apnea and restless leg syndrome.  He has DOT license.      Subjective:   Uses CPAP nightly.  No issues with mask fit.  Sleeping well.  Feels rested.  Has appointment with nutritionist coming up.  Plans to start exercising more.  Needs a new PCP.  Physical Exam:   Appearance - well kempt   ENMT - no sinus tenderness, no oral exudate, no LAN, Mallampati 4 airway, no stridor  Respiratory - equal breath sounds bilaterally, no wheezing or rales  CV - s1s2 regular rate and rhythm, no murmurs  Ext - no clubbing, no edema  Skin - no rashes  Psych - normal mood and affect     Pulmonary Tests:  PFT 09/04/22 >> FEV1 3.58 (88%), FEV1% 86, TLC 6.54 (88%), DLCO 90%, no BD  Sleep Tests:  HST 07/27/20 >> AHI AHI 20, SpO2 low 86%. Auto CPAP 06/10/22 07/09/22 >> used on 30 of 30 nights with average 8 hrs 15 min.  Average AHI 3.5 with median CPAP 11 and 95 th percentile CPAP 13 cm H2O CPAP titration 08/20/22 >> CPAP 13 cm H2O >> AHI 1.9.  Auto CPAP 01/13/23 to 02/11/23 >> used on 30 of 30 nights with average 7 hrs 26 min.  Average AHI 1.3 with CPAP 13 cm H2O  Cardiac Tests:  Echo 12/04/22 >> EF 60 to 65%  Cardiac CT 12/18/22 >> coronary calcium score 202 (89th percentile)   Social History:  He  reports that he quit smoking about 15 years ago. His smoking use included cigarettes. He started smoking about 33 years ago. He has a 54 pack-year smoking history. He has never used  smokeless tobacco. He reports that he does not drink alcohol and does not use drugs.  Family History:  His family history includes Heart disease in his maternal grandmother and mother; Hypertension in his daughter; Parkinson's disease in his paternal grandmother; Tremor in his father.      Assessment/Plan:   Erythrocytosis. - likely form testosterone use, sleep apnea, and carbon monoxide exposure - most recent Hb improved  Obstructive sleep apnea. - he is compliant with therapy and reports benefit from CPAP - he uses [employer] for his DME - current CPAP ordered on 07/29/20 - continue CPAP 13 cm H2O  Dyspnea on exertion. - likely from obesity and deconditioning - discussed diet modification and exercise regimen  Restless leg syndrome. - f/u with PCP  Hand tremor. - followed by Dr. Huston Foley with [facility]  Obesity. - discussed how his weight can impact control of sleep apnea  Time Spent Involved in Patient Care on Day of Examination:  26 minutes  Follow up:   Patient Instructions  Your sleep apnea number from your sleep study was 20 events per hour of sleep.  Follow up in 1 year.  Medication List:   Allergies as of 02/13/2023  Reactions   Mobic [meloxicam] Other (See Comments)   Mouth sores        Medication List        Accurate as of February 13, 2023 10:35 AM. If you have any questions, ask your nurse or doctor.          amLODipine 5 MG tablet Commonly known as: NORVASC TAKE ONE (1) TABLET BY MOUTH EVERY DAY   aspirin EC 81 MG tablet Take 81 mg by mouth daily.   BD Disp Needle 23G X 1" Misc Generic drug: NEEDLE (DISP) 23 G Use every 14 days with testosterone   ECHINACEA C COMPLETE PO Take by mouth.   famotidine 40 MG tablet Commonly known as: PEPCID TAKE THREE TABLETS BY MOUTH EVERY NIGHT AT BEDTIME   fluticasone 50 MCG/ACT nasal spray Commonly known as: FLONASE Place 2 sprays into both nostrils daily.   furosemide 80 MG  tablet Commonly known as: LASIX Take 1 tablet (80 mg total) by mouth 2 (two) times daily.   gabapentin 300 MG capsule Commonly known as: NEURONTIN Take 1 capsule (300 mg total) by mouth 3 (three) times daily.   HEALTHY COLON PO Take by mouth.   montelukast 10 MG tablet Commonly known as: SINGULAIR Take 1 tablet (10 mg total) by mouth at bedtime.   MULTIVITAMIN MEN PO Take by mouth.   pantoprazole 40 MG tablet Commonly known as: PROTONIX Take 1 tablet (40 mg total) by mouth 2 (two) times daily.   potassium chloride SA 20 MEQ tablet Commonly known as: KLOR-CON M Take 1 tablet (20 mEq total) by mouth daily.   propranolol ER 60 MG 24 hr capsule Commonly known as: INDERAL LA Take 1 capsule (60 mg total) by mouth daily.   rOPINIRole 4 MG tablet Commonly known as: REQUIP Take 1 tablet (4 mg total) by mouth at bedtime.   sildenafil 100 MG tablet Commonly known as: VIAGRA Take 1 tablet (100 mg total) by mouth daily as needed.   Syringe/Needle (Disp) 18G X 1" 3 ML Misc 1 Device by Does not apply route every 14 (fourteen) days.   tadalafil 10 MG tablet Commonly known as: CIALIS Take 10 mg by mouth daily as needed for erectile dysfunction.   tadalafil 5 MG tablet Commonly known as: CIALIS Take 1 tablet (5 mg total) by mouth daily as needed for erectile dysfunction.   testosterone cypionate 200 MG/ML injection Commonly known as: DEPOTESTOSTERONE CYPIONATE INJECT 0.5ML'S (100MG  TOTAL) INTO THE MUSCLE ONCE A WEEK   traZODone 100 MG tablet Commonly known as: DESYREL TAKE ONE TABLET (100MG  TOTAL) BY MOUTH AT BEDTIME   URINOZINC PLUS PO Take by mouth.        Signature:  Coralyn Helling, MD [facility] Pager - [phone] 02/13/2023, 10:35 AM

## 2023-02-14 ENCOUNTER — Institutional Professional Consult (permissible substitution): Payer: 59 | Admitting: Neurology

## 2023-02-19 ENCOUNTER — Telehealth: Payer: Self-pay | Admitting: Internal Medicine

## 2023-02-19 ENCOUNTER — Encounter: Payer: 59 | Attending: Internal Medicine | Admitting: Nutrition

## 2023-02-19 DIAGNOSIS — Z6841 Body Mass Index (BMI) 40.0 and over, adult: Secondary | ICD-10-CM | POA: Insufficient documentation

## 2023-02-19 DIAGNOSIS — E118 Type 2 diabetes mellitus with unspecified complications: Secondary | ICD-10-CM | POA: Insufficient documentation

## 2023-02-19 DIAGNOSIS — I10 Essential (primary) hypertension: Secondary | ICD-10-CM | POA: Insufficient documentation

## 2023-02-19 NOTE — Progress Notes (Signed)
Medical Nutrition Therapy  Appointment Start time:  1435  Appointment End time:  1545  Primary concerns today: DM type 2, Morbid Obesity  Referral diagnosis: E11.8, E66.01 Preferred learning style: No preference  Learning readiness: Ready    NUTRITION ASSESSMENT  Newly diagnosed 57 yr old male. PCP Dr. Durwin Nora. A1C 6.5% BMI 50. Hyperlipidemia with elevated TG. HIgh risk for pancreatitis.Elevated liver enzymes. He notes he often doesn't eat til when he gets home and is starving  and then eats a lot late at night and goes to bed.   Hasn't started testing blood sugars. Needs to get a meter and testing supplies. Has used his wife's.  He is ready to lose weight and eat healthier to reverse his DM and improve his health. He is willing to start walking and working out.  He is willing to work on eating more of a whole plant based diet with Lifestyle Medicine and reverse as much of his chronic diseases as possible and reduce his medication burden.    Anthropometrics  Wt Readings from Last 3 Encounters:  02/19/23 (!) 360 lb (163.3 kg)  02/13/23 (!) 360 lb (163.3 kg)  01/28/23 (!) 356 lb 0.6 oz (161.5 kg)   Ht Readings from Last 3 Encounters:  02/19/23 5\' 11"  (1.803 m)  02/13/23 5' 11.5" (1.816 m)  01/28/23 5' 11.5" (1.816 m)   Body mass index is 50.21 kg/m. @BMIFA @ Facility age limit for growth %iles is 20 years. Facility age limit for growth %iles is 20 years.    Clinical Medical Hx: See chart Medications: See chart Labs:  Lab Results  Component Value Date   HGBA1C 6.5 (A) 01/28/2023      Latest Ref Rng & Units 01/28/2023    9:55 AM 03/07/2022    8:46 AM 11/21/2021    2:40 PM  CMP  Glucose 70 - 99 mg/dL 952  841  79   BUN 6 - 24 mg/dL 19  18  16    Creatinine 0.76 - 1.27 mg/dL 3.24  4.01  0.27   Sodium 134 - 144 mmol/L 141  139  143   Potassium 3.5 - 5.2 mmol/L 3.8  4.1  4.1   Chloride 96 - 106 mmol/L 105  103  105   CO2 20 - 29 mmol/L 18  22  23    Calcium 8.7 - 10.2  mg/dL 9.3  9.3  9.8   Total Protein 6.0 - 8.5 g/dL 6.7  6.4  7.2   Total Bilirubin 0.0 - 1.2 mg/dL 0.4  0.6  0.5   Alkaline Phos 44 - 121 IU/L 103  61  64   AST 0 - 40 IU/L 49  22  25   ALT 0 - 44 IU/L 58  28  30    Lipid Panel     Component Value Date/Time   CHOL 173 01/28/2023 0955   TRIG 526 (H) 01/28/2023 0955   HDL 25 (L) 01/28/2023 0955   CHOLHDL 6.9 (H) 01/28/2023 0955   LDLCALC 67 01/28/2023 0955   LABVLDL 81 (H) 01/28/2023 0955    Notable Signs/Symptoms: None  Lifestyle & Dietary Hx Lives with his wife. Eats 2-3 meals per day.   Estimated daily fluid intake: 40 oz Supplements:  Sleep: poor Stress / self-care:  Current average weekly physical activity: ADL  24-Hr Dietary Recall Eats 1 meals per day day. Eats usually 1 big at home when he gets home and goes to bed. Eats at home and eats out a lot.  Estimated Energy Needs Calories: 1800 Carbohydrate: 200g Protein: 135g Fat: 50g   NUTRITION DIAGNOSIS  NI-1.7 Predicted excessive energy intake As related to High Calorie High Carb diet.  As evidenced by BMI 50 and A1C 6.5%..   NUTRITION INTERVENTION  Nutrition education (E-1) on the following topics:  Nutrition and Diabetes education provided on My Plate, CHO counting, meal planning, portion sizes, timing of meals, avoiding snacks between meals unless having a low blood sugar, target ranges for A1C and blood sugars, signs/symptoms and treatment of hyper/hypoglycemia, monitoring blood sugars, taking medications as prescribed, benefits of exercising 30 minutes per day and prevention of complications of DM.   Lifestyle Medicine  - Whole Food, Plant Predominant Nutrition is highly recommended: Eat Plenty of vegetables, Mushrooms, fruits, Legumes, Whole Grains, Nuts, seeds in lieu of processed meats, processed snacks/pastries red meat, poultry, eggs.    -It is better to avoid simple carbohydrates including: Cakes, Sweet Desserts, Ice Cream, Soda (diet and regular),  Sweet Tea, Candies, Chips, Cookies, Store Bought Juices, Alcohol in Excess of  1-2 drinks a day, Lemonade,  Artificial Sweeteners, Doughnuts, Coffee Creamers, "Sugar-free" Products, etc, etc.  This is not a complete list.....  Exercise: If you are able: 30 -60 minutes a day ,4 days a week, or 150 minutes a week.  The longer the better.  Combine stretch, strength, and aerobic activities.  If you were told in the past that you have high risk for cardiovascular diseases, you may seek evaluation by your heart doctor prior to initiating moderate to intense exercise programs.   Handouts Provided Include  Lifestyle Medicine handouts Know your numbers  Learning Style & Readiness for Change Teaching method utilized: Visual & Auditory  Demonstrated degree of understanding via: Teach Back  Barriers to learning/adherence to lifestyle change: None  Goals Established by Pt Goals  Eat three meals per day Increase whole plant based foods Stop eating after 7 pm Get a protein, starch, fruit and veggies with meals. Cut out processed foods Lose 1-2 lbs per week.   MONITORING & EVALUATION Dietary intake, weekly physical activity, and blood sugars and weight in 1 month. .  Next Steps  Patient is to work on eating three meals per day and taking food to work.Marland Kitchen

## 2023-02-19 NOTE — Telephone Encounter (Signed)
I will forward message to MD who would need to add diagnosis of diabetes

## 2023-02-19 NOTE — Telephone Encounter (Signed)
Spoke to Penny-Dietician- who verbalized understanding of waiting until MD is back in the office for referral.

## 2023-02-19 NOTE — Telephone Encounter (Signed)
Remind Me reminder added to inbox to address this with provider when she returns back to office.

## 2023-02-19 NOTE — Telephone Encounter (Signed)
Calling to ask that we add type 2 diabetes to the referral so that the insurance can cover it. Please advise

## 2023-02-19 NOTE — Patient Instructions (Signed)
Goals  Eat three meals per day Increase whole plant based foods Stop eating after 7 pm Get a protein, starch, fruit and veggies with meals. Cut out processed foods Lose 1-2 lbs per week.

## 2023-03-06 ENCOUNTER — Other Ambulatory Visit: Payer: Self-pay

## 2023-03-06 DIAGNOSIS — K219 Gastro-esophageal reflux disease without esophagitis: Secondary | ICD-10-CM

## 2023-03-06 MED ORDER — FAMOTIDINE 40 MG PO TABS
ORAL_TABLET | ORAL | 0 refills | Status: DC
Start: 2023-03-06 — End: 2023-05-27

## 2023-03-06 NOTE — Addendum Note (Signed)
Addended by: Telford Nab on: 03/06/2023 10:47 AM   Modules accepted: Orders

## 2023-03-19 ENCOUNTER — Ambulatory Visit
Admission: EM | Admit: 2023-03-19 | Discharge: 2023-03-19 | Disposition: A | Discharge status: Home / Self Care | Payer: 59 | Attending: Family Medicine | Admitting: Family Medicine

## 2023-03-19 ENCOUNTER — Telehealth: Payer: Self-pay

## 2023-03-19 DIAGNOSIS — R310 Gross hematuria: Secondary | ICD-10-CM | POA: Diagnosis not present

## 2023-03-19 LAB — POCT URINALYSIS DIP (MANUAL ENTRY)
Bilirubin, UA: NEGATIVE
Glucose, UA: NEGATIVE mg/dL
Ketones, POC UA: NEGATIVE mg/dL
Leukocytes, UA: NEGATIVE
Nitrite, UA: NEGATIVE
Protein Ur, POC: 30 mg/dL — AB
Spec Grav, UA: 1.015 (ref 1.010–1.025)
Urobilinogen, UA: 1 U/dL
pH, UA: 6 (ref 5.0–8.0)

## 2023-03-19 MED ORDER — SULFAMETHOXAZOLE-TRIMETHOPRIM 800-160 MG PO TABS: 1.0000 | ORAL_TABLET | Freq: Two times a day (BID) | ORAL | 0 refills | Status: AC

## 2023-03-19 NOTE — Telephone Encounter (Signed)
Patient stopped by office to make an appointment.  Went to [facility] with symptoms of pressure and burning in prostate area.  Is now having thick clot bleeding after urination.  No available opens until October.  He states he will go to the ER if he needs to.  Wanting to see and or be advised by Dr. Annabell Howells as soon as possible.  Please advise.

## 2023-03-19 NOTE — Discharge Instructions (Signed)
Start the antibiotics to see if this helps improve your symptoms.  Follow-up with the urologist as soon as possible for a recheck and further evaluation.  Go to the emergency department if severely worsening at any time.

## 2023-03-19 NOTE — ED Triage Notes (Signed)
Pt reports he has blood in his urine, trouble  "with his balls", and burning with urination  x 2 months. States blood in urine started today.   States  "we have sex like everyday regularly" and wants to know if that is the cause.

## 2023-03-19 NOTE — Telephone Encounter (Signed)
Patient called with complains of pain and hematuria.  I returned his call to offer an apt, no answer.  I left voicemail to return call to schedule apt.

## 2023-03-20 NOTE — Telephone Encounter (Signed)
He needs a CT hematuria study and will need f/u for possible cystoscopy.  His UA had blood but it didn't appear infected.  Tried calling patient with no answer, left vm for return call.

## 2023-03-21 NOTE — Telephone Encounter (Signed)
Patient made aware via detailed voice message on patient cell phone.

## 2023-03-23 NOTE — ED Provider Notes (Signed)
[facility]    CSN: [id number] Arrival date & time: 03/19/23  1512      History   Chief Complaint No chief complaint on file.   HPI Jesse Castillo is a 57 y.o. male.   Patient presenting today with about 2 months of intermittent hematuria, dysuria significantly worse today passing clots.  Denies pelvic or abdominal pain, flank pain, fever, nausea, vomiting, bowel changes.  He states he does have intercourse about every day and wonders if this is causing irritation.  Denies any concerns for STIs.  When asked, does note some rectal region pressure and pain.  So far not tried anything over-the-counter for symptoms.  He does have a history of enlarged prostate and erectile dysfunction currently on Cialis and also takes testosterone injections.  Is followed by urology.    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   Congestive heart failure (CHF) (HCC) 10/31/2022   Erythrocytosis 09/19/2022   Mixed hyperlipidemia 09/19/2022   Generalized anxiety disorder 03/07/2022   Depression, recurrent (HCC) 03/07/2022   Insomnia due to medical condition 03/07/2022   Essential tremor 04/24/2021   Seasonal allergies 04/24/2021   Umbilical hernia without obstruction and without gangrene 04/24/2021   Erectile dysfunction 08/04/2020   Gastroesophageal reflux disease 12/21/2019   Essential hypertension 09/20/2019   Arthritis 09/20/2019   Morbid obesity (HCC) 09/20/2019   Low testosterone in male 09/18/2019   Enlarged prostate    Neuropathy    Restless leg    OSA on CPAP     Past Surgical History:   Procedure Laterality Date   HERNIA REPAIR         Home Medications    Prior to Admission medications   Medication Sig Start Date End Date Taking? Authorizing Provider  sulfamethoxazole-trimethoprim (BACTRIM DS) 800-160 MG tablet Take 1 tablet by mouth 2 (two) times daily for 14 days. 03/19/23 04/02/23 Yes Particia Nearing, PA-C  amLODipine (NORVASC) 5 MG tablet TAKE ONE (1) TABLET BY MOUTH EVERY DAY 09/20/22   Gabriel Earing, FNP  aspirin EC 81 MG tablet Take 81 mg by mouth daily.    [provider]  famotidine (PEPCID) 40 MG tablet TAKE THREE TABLETS BY MOUTH EVERY NIGHT AT BEDTIME 03/06/23   Billie Lade, MD  fluticasone [facility]) 50 MCG/ACT nasal spray Place 2 sprays into both nostrils daily. 09/04/22   Coralyn Helling, MD  furosemide (LASIX) 80 MG tablet Take 1 tablet (80 mg total) by mouth 2 (two) times daily. 11/09/22 02/07/23  Mallipeddi, Vishnu P, MD  gabapentin (NEURONTIN) 300 MG capsule Take 1 capsule (300 mg total) by mouth 3 (three) times daily. 01/28/23   Gardenia Phlegm, MD  Misc Natural Products [facility] PLUS PO) Take by mouth.    [provider]  montelukast (SINGULAIR) 10 MG tablet Take 1 tablet (10 mg total) by mouth at bedtime. 09/19/22   Gabriel Earing, FNP  Multiple Vitamins-Minerals (MULTIVITAMIN MEN PO) Take by mouth.    [provider]  NEEDLE, DISP, 23 G (BD DISP NEEDLE)  23G X 1" MISC Use every 14 days with testosterone 03/08/22   Gabriel Earing, FNP  pantoprazole (PROTONIX) 40 MG tablet Take 1 tablet (40 mg total) by mouth 2 (two) times daily. 01/28/23   Gardenia Phlegm, MD  potassium chloride SA (KLOR-CON M) 20 MEQ tablet Take 1 tablet (20 mEq total) by mouth daily. 10/31/22   Mallipeddi, Orion Modest, MD  Probiotic Product (HEALTHY COLON PO) Take by mouth.    [provider]  propranolol ER (INDERAL LA) 60 MG 24 hr capsule Take 1 capsule (60 mg total) by mouth daily. 11/13/22   Huston Foley, MD  rOPINIRole (REQUIP) 4 MG tablet Take  1 tablet (4 mg total) by mouth at bedtime. 09/20/22   Gabriel Earing, FNP  sildenafil (VIAGRA) 100 MG tablet Take 1 tablet (100 mg total) by mouth daily as needed. 07/12/22   Bjorn Pippin, MD  Specialty Vitamins Products (ECHINACEA C COMPLETE PO) Take by mouth.    [provider]  Syringe/Needle, Disp, 18G X 1" 3 ML MISC 1 Device by Does not apply route every 14 (fourteen) days. 05/23/22   Gabriel Earing, FNP  tadalafil (CIALIS) 10 MG tablet Take 10 mg by mouth daily as needed for erectile dysfunction.    [provider]  tadalafil (CIALIS) 5 MG tablet Take 1 tablet (5 mg total) by mouth daily as needed for erectile dysfunction. 07/12/22   Bjorn Pippin, MD  testosterone cypionate (DEPOTESTOSTERONE CYPIONATE) 200 MG/ML injection INJECT 0.5ML'S (100MG  TOTAL) INTO THE MUSCLE ONCE A WEEK 10/18/22   Bjorn Pippin, MD  traZODone (DESYREL) 100 MG tablet TAKE ONE TABLET (100MG  TOTAL) BY MOUTH AT BEDTIME 11/05/22   Gabriel Earing, FNP    Family History Family History  Problem Relation Age of Onset   Heart disease Mother    Tremor Father    Hypertension Daughter    Heart disease Maternal Grandmother    Parkinson's disease Paternal Grandmother     Social History Social History   Tobacco Use   Smoking status: Former    Current packs/day: 0.00    Average packs/day: 3.0 packs/day for 18.0 years (54.0 ttl pk-yrs)    Types: Cigarettes    Start date: 07/02/1989    Quit date: 07/03/2007    Years since quitting: 15.7   Smokeless tobacco: Never  Vaping Use   Vaping status: Never Used  Substance Use Topics   Alcohol use: Never   Drug use: Never     Allergies   Mobic [meloxicam]   Review of Systems Review of Systems Per HPI  Physical Exam Triage Vital Signs ED Triage Vitals  Encounter Vitals Group     BP 03/19/23 1543 (!) 146/81     Systolic BP Percentile --      Diastolic BP Percentile --      Pulse Rate 03/19/23 1543 79     Resp 03/19/23 1543 16     Temp 03/19/23  1543 98.2 F (36.8 C)     Temp Source 03/19/23 1543 Oral     SpO2 03/19/23 1543 97 %     Weight --      Height --      Head Circumference --      Peak Flow --      Pain Score 03/19/23 1557 6     Pain Loc --      Pain Education --      Exclude from Growth Chart --    No data found.  Updated Vital Signs BP (!) 146/81 (BP Location: Right Arm)   Pulse 79   Temp 98.2 F (36.8 C) (Oral)   Resp 16   SpO2 97%   Visual Acuity Right Eye Distance:   Left Eye Distance:   Bilateral Distance:    Right Eye Near:   Left Eye Near:    Bilateral Near:     Physical Exam Vitals and nursing note reviewed.  Constitutional:      Appearance: Normal appearance.  HENT:     Head: Atraumatic.     Mouth/Throat:     Mouth: Mucous membranes are moist.  Eyes:     Extraocular Movements: Extraocular movements intact.     Conjunctiva/sclera: Conjunctivae normal.  Cardiovascular:     Rate and Rhythm: Normal rate and regular rhythm.  Pulmonary:     Effort: Pulmonary effort is normal.     Breath sounds: Normal breath sounds.  Abdominal:     General: Bowel sounds are normal. There is no distension.     Palpations: Abdomen is soft.     Tenderness: There is no abdominal tenderness. There is no guarding.  Genitourinary:    Comments: GU exam deferred to urology Musculoskeletal:        General: Normal range of motion.     Cervical back: Normal range of motion and neck supple.  Skin:    General: Skin is warm and dry.  Neurological:     General: No focal deficit present.     Mental Status: He is oriented to person, place, and time.  Psychiatric:        Mood and Affect: Mood normal.        Thought Content: Thought content normal.        Judgment: Judgment normal.      UC Treatments / Results  Labs (all labs ordered are listed, but only abnormal results are displayed) Labs Reviewed  POCT URINALYSIS DIP (MANUAL ENTRY) - Abnormal; Notable for the following components:      Result Value    Color, UA red (*)    Clarity, UA cloudy (*)    Blood, UA large (*)    Protein Ur, POC =30 (*)    All other components within normal limits    EKG   Radiology No results found.  Procedures Procedures (including critical care time)  Medications Ordered in UC Medications - No data to display  Initial Impression / Assessment and Plan / UC Course  I have reviewed the triage vital signs and the nursing notes.  Pertinent labs & imaging results that were available during my care of the patient were reviewed by me and considered in my medical decision making (see chart for details).     Unclear etiology of his hematuria at this time, does not appear to have a urinary tract infection but possibly prostatitis versus kidney stone.  Will cover for possible infectious cause with Bactrim and discussed close urology follow-up for further evaluation.  Go to the emergency department for any worsening symptoms at any time.  Final Clinical Impressions(s) / UC Diagnoses   Final diagnoses:  Gross hematuria     Discharge Instructions      Start the antibiotics to see if this helps improve your symptoms.  Follow-up with the urologist as soon as possible for a recheck and further evaluation.  Go to the emergency department if severely worsening at any time.    ED Prescriptions     Medication Sig Dispense Auth. Provider  sulfamethoxazole-trimethoprim (BACTRIM DS) 800-160 MG tablet Take 1 tablet by mouth 2 (two) times daily for 14 days. 28 tablet Particia Nearing, [place]      PDMP not reviewed this encounter.   Particia Nearing, [place] 03/23/23 1409

## 2023-03-25 ENCOUNTER — Other Ambulatory Visit: Payer: Self-pay | Admitting: Family Medicine

## 2023-03-25 ENCOUNTER — Other Ambulatory Visit: Payer: Self-pay | Admitting: Internal Medicine

## 2023-03-26 ENCOUNTER — Ambulatory Visit: Payer: 59 | Admitting: Internal Medicine

## 2023-04-11 ENCOUNTER — Emergency Department (HOSPITAL_COMMUNITY)
Admission: EM | Admit: 2023-04-11 | Discharge: 2023-04-11 | Disposition: A | Discharge status: Home / Self Care | Payer: 59 | Attending: Student | Admitting: Student

## 2023-04-11 ENCOUNTER — Other Ambulatory Visit: Payer: Self-pay

## 2023-04-11 ENCOUNTER — Emergency Department (HOSPITAL_COMMUNITY): Payer: 59

## 2023-04-11 ENCOUNTER — Encounter (HOSPITAL_COMMUNITY): Payer: Self-pay | Admitting: *Deleted

## 2023-04-11 DIAGNOSIS — N433 Hydrocele, unspecified: Secondary | ICD-10-CM | POA: Diagnosis not present

## 2023-04-11 DIAGNOSIS — K8689 Other specified diseases of pancreas: Secondary | ICD-10-CM | POA: Diagnosis not present

## 2023-04-11 DIAGNOSIS — N50811 Right testicular pain: Secondary | ICD-10-CM | POA: Diagnosis not present

## 2023-04-11 DIAGNOSIS — R1030 Lower abdominal pain, unspecified: Secondary | ICD-10-CM | POA: Diagnosis not present

## 2023-04-11 DIAGNOSIS — K439 Ventral hernia without obstruction or gangrene: Secondary | ICD-10-CM | POA: Diagnosis not present

## 2023-04-11 DIAGNOSIS — Z7982 Long term (current) use of aspirin: Secondary | ICD-10-CM | POA: Insufficient documentation

## 2023-04-11 DIAGNOSIS — N50812 Left testicular pain: Secondary | ICD-10-CM | POA: Diagnosis not present

## 2023-04-11 DIAGNOSIS — K6389 Other specified diseases of intestine: Secondary | ICD-10-CM | POA: Diagnosis not present

## 2023-04-11 DIAGNOSIS — K8020 Calculus of gallbladder without cholecystitis without obstruction: Secondary | ICD-10-CM | POA: Diagnosis not present

## 2023-04-11 LAB — URINALYSIS, ROUTINE W REFLEX MICROSCOPIC
Bilirubin Urine: NEGATIVE
Glucose, UA: NEGATIVE mg/dL
Hgb urine dipstick: NEGATIVE
Ketones, ur: NEGATIVE mg/dL
Leukocytes,Ua: NEGATIVE
Nitrite: NEGATIVE
Protein, ur: NEGATIVE mg/dL
Specific Gravity, Urine: 1.008 (ref 1.005–1.030)
pH: 6 (ref 5.0–8.0)

## 2023-04-11 LAB — BASIC METABOLIC PANEL
Anion gap: 9 (ref 5–15)
BUN: 18 mg/dL (ref 6–20)
CO2: 27 mmol/L (ref 22–32)
Calcium: 9.3 mg/dL (ref 8.9–10.3)
Chloride: 99 mmol/L (ref 98–111)
Creatinine, Ser: 1.11 mg/dL (ref 0.61–1.24)
GFR, Estimated: >60 mL/min (ref >60)
Glucose, Bld: 123 mg/dL — ABNORMAL HIGH (ref 70–99)
Potassium: 3.6 mmol/L (ref 3.5–5.1)
Sodium: 135 mmol/L (ref 135–145)

## 2023-04-11 LAB — CBC WITH DIFFERENTIAL/PLATELET
Abs Immature Granulocytes: 0.03 10*3/uL (ref 0.00–0.07)
Basophils Absolute: 0.1 10*3/uL (ref 0.0–0.1)
Basophils Relative: 1 %
Eosinophils Absolute: 0.1 10*3/uL (ref 0.0–0.5)
Eosinophils Relative: 2 %
HCT: 50.4 % (ref 39.0–52.0)
Hemoglobin: 17.4 g/dL — ABNORMAL HIGH (ref 13.0–17.0)
Immature Granulocytes: 0 %
Lymphocytes Relative: 23 %
Lymphs Abs: 2.1 10*3/uL (ref 0.7–4.0)
MCH: 32 pg (ref 26.0–34.0)
MCHC: 34.5 g/dL (ref 30.0–36.0)
MCV: 92.8 fL (ref 80.0–100.0)
Monocytes Absolute: 1.5 10*3/uL — ABNORMAL HIGH (ref 0.1–1.0)
Monocytes Relative: 17 %
Neutro Abs: 5.4 10*3/uL (ref 1.7–7.7)
Neutrophils Relative %: 57 %
Platelets: 231 10*3/uL (ref 150–400)
RBC: 5.43 MIL/uL (ref 4.22–5.81)
RDW: 13.5 % (ref 11.5–15.5)
WBC: 9.3 10*3/uL (ref 4.0–10.5)
nRBC: 0 % (ref 0.0–0.2)

## 2023-04-11 MED ORDER — IOHEXOL 300 MG/ML  SOLN
125.0000 mL | Freq: Once | INTRAMUSCULAR | Status: AC | PRN
  Administered 2023-04-11: 125 mL via INTRAVENOUS

## 2023-04-11 MED ORDER — HYDROCODONE-ACETAMINOPHEN 5-325 MG PO TABS: 1.0000 | ORAL_TABLET | Freq: Four times a day (QID) | ORAL | 0 refills | Status: AC | PRN

## 2023-04-11 NOTE — ED Triage Notes (Signed)
Pt c/o groin pain and pressure x 2 months. A few weeks ago pt had blood in his urine that lasted about 1.5 days. Pt went to Urgent Care and gave him medication for an irritated prostate. He attempted to follow up with his Urologist but can't see them until December.

## 2023-04-11 NOTE — ED Notes (Signed)
Back from CT, alert, NAD, calm, interactive, no changes.  

## 2023-04-11 NOTE — ED Notes (Signed)
Pt resting, alert, NAD, calm, interactive. Denies sx or complaints, States, "ready to go". EDPA at [facility].

## 2023-04-11 NOTE — Discharge Instructions (Addendum)
Pleasure taking care of you today.  Your blood work was reassuring, your CT scan showed some thickening of your bladder wall and a slight prominence of your prostate.  You have no signs of UTI.  Your ultrasound of your testicles showed a small hydrocele and bilateral either epididymal cyst or spermatocele this.  You to follow-up with urology.  This may be causing your discomfort.  You can take the hydrocodone as needed for discomfort it is not relieved by Tylenol alone.  Do not drive or operate machinery while taking the hydrocodone as it can make you sleepy and this can be dangerous.  Come back to the ER if you have swelling, increased pain, fevers or any other new or worsening symptoms.

## 2023-04-11 NOTE — ED Notes (Addendum)
US at [facility]

## 2023-04-11 NOTE — ED Notes (Signed)
EDPA at [facility] with chaperone for testical exam. Pt alert, NAD, calm, interactive, resps e/u, speaking clearly. Minimal redness noted. No obvious induration. Minimal TTP R>L. No wound or drainage.

## 2023-04-11 NOTE — ED Provider Notes (Signed)
[address] EMERGENCY DEPARTMENT AT [facility] Provider Note   CSN: [id number] Arrival date & time: 04/11/23  0941     History  Chief Complaint  Patient presents with   Groin Pain    Jesse Castillo is a 57 y.o. male.  Presents ER today for pain and pressure and his testicles and perineal area for about a month.  Denies swelling or redness, denies fever, denies dysuria but states he did have hematuria and was seen in urgent care on 9/17 for this, states it lasted for couple days they prescribed him Bactrim.  No hematuria since then, he called his urologist and has not been able to get an appointment but was told he would need a CT scan which she has not been able to have done yet has not scheduled yet.  He denies flank pain.  No fevers or chills.  He is not having any difficulty urinating.  He states he still feels like something is wrong due to the continued pressure feeling and wanted to be evaluated.  He does have a PMH of hypertension, BPH and erectile dysfunction is on Cialis for this.  He is also on testosterone injections.   Groin Pain       Home Medications Prior to Admission medications   Medication Sig Start Date End Date Taking? Authorizing Provider  HYDROcodone-acetaminophen (NORCO) 5-325 MG tablet Take 1 tablet by mouth every 6 (six) hours as needed for moderate pain. 04/11/23  Yes Mersades Barbaro A, PA-C  amLODipine (NORVASC) 5 MG tablet TAKE ONE (1) TABLET BY MOUTH EVERY DAY 09/20/22   Gabriel Earing, FNP  aspirin EC 81 MG tablet Take 81 mg by mouth daily.    [provider]  diclofenac (VOLTAREN) 75 MG EC tablet TAKE ONE TABLET (75MG  TOTAL) BY MOUTH TWO TIMES DAILY 03/25/23   Billie Lade, MD  famotidine (PEPCID) 40 MG tablet TAKE THREE TABLETS BY MOUTH EVERY NIGHT AT BEDTIME 03/06/23   Billie Lade, MD  fluticasone (FLONASE) 50 MCG/ACT nasal spray Place 2 sprays into both nostrils daily. 09/04/22   Coralyn Helling, MD  furosemide (LASIX) 80 MG  tablet Take 1 tablet (80 mg total) by mouth 2 (two) times daily. 11/09/22 02/07/23  Mallipeddi, Vishnu P, MD  gabapentin (NEURONTIN) 300 MG capsule Take 1 capsule (300 mg total) by mouth 3 (three) times daily. 01/28/23   Gardenia Phlegm, MD  Misc Natural Products [facility] PLUS PO) Take by mouth.    [provider]  montelukast (SINGULAIR) 10 MG tablet Take 1 tablet (10 mg total) by mouth at bedtime. 09/19/22   Gabriel Earing, FNP  Multiple Vitamins-Minerals (MULTIVITAMIN MEN PO) Take by mouth.    [provider]  NEEDLE, DISP, 23 G (BD DISP NEEDLE) 23G X 1" MISC Use every 14 days with testosterone 03/08/22   Gabriel Earing, FNP  pantoprazole (PROTONIX) 40 MG tablet Take 1 tablet (40 mg total) by mouth 2 (two) times daily. 01/28/23   Gardenia Phlegm, MD  potassium chloride SA (KLOR-CON M) 20 MEQ tablet Take 1 tablet (20 mEq total) by mouth daily. 10/31/22   Mallipeddi, Orion Modest, MD  Probiotic Product (HEALTHY COLON PO) Take by mouth.    [provider]  propranolol ER (INDERAL LA) 60 MG 24 hr capsule Take 1 capsule (60 mg total) by mouth daily. 11/13/22   Huston Foley, MD  rOPINIRole (REQUIP) 4 MG tablet Take 1 tablet (4 mg total) by mouth at bedtime.  09/20/22   Gabriel Earing, FNP  sildenafil (VIAGRA) 100 MG tablet Take 1 tablet (100 mg total) by mouth daily as needed. 07/12/22   Bjorn Pippin, MD  Specialty Vitamins Products (ECHINACEA C COMPLETE PO) Take by mouth.    [provider]  Syringe/Needle, Disp, 18G X 1" 3 ML MISC 1 Device by Does not apply route every 14 (fourteen) days. 05/23/22   Gabriel Earing, FNP  tadalafil (CIALIS) 10 MG tablet Take 10 mg by mouth daily as needed for erectile dysfunction.    [provider]  tadalafil (CIALIS) 5 MG tablet Take 1 tablet (5 mg total) by mouth daily as needed for erectile dysfunction. 07/12/22   Bjorn Pippin, MD  testosterone cypionate (DEPOTESTOSTERONE CYPIONATE) 200 MG/ML injection INJECT 0.5ML'S (100MG  TOTAL)  INTO THE MUSCLE ONCE A WEEK 10/18/22   Bjorn Pippin, MD  traZODone (DESYREL) 100 MG tablet TAKE ONE TABLET (100MG  TOTAL) BY MOUTH AT BEDTIME 11/05/22   Gabriel Earing, FNP      Allergies    Mobic [meloxicam]    Review of Systems   Review of Systems  Physical Exam Updated Vital Signs BP 126/83   Pulse 72   Temp (!) 97.5 F (36.4 C) (Oral)   Resp 18   Ht 5\' 11"  (1.803 m)   Wt (!) 158.8 kg   SpO2 95%   BMI 48.82 kg/m  Physical Exam Vitals and nursing note reviewed. Exam conducted with a chaperone present.  Constitutional:      General: He is not in acute distress.    Appearance: He is well-developed.  HENT:     Head: Normocephalic and atraumatic.     Mouth/Throat:     Mouth: Mucous membranes are moist.  Eyes:     Conjunctiva/sclera: Conjunctivae normal.  Cardiovascular:     Rate and Rhythm: Normal rate and regular rhythm.     Heart sounds: No murmur heard. Pulmonary:     Effort: Pulmonary effort is normal. No respiratory distress.     Breath sounds: Normal breath sounds.  Abdominal:     Palpations: Abdomen is soft.     Tenderness: There is no abdominal tenderness.  Genitourinary:    Penis: Normal.      Testes:        Right: Tenderness present. Swelling not present.        Left: Tenderness present. Swelling not present.  Musculoskeletal:        General: No swelling. Normal range of motion.     Cervical back: Neck supple.  Skin:    General: Skin is warm and dry.     Capillary Refill: Capillary refill takes less than 2 seconds.  Neurological:     General: No focal deficit present.     Mental Status: He is alert and oriented to person, place, and time.  Psychiatric:        Mood and Affect: Mood normal.     ED Results / Procedures / Treatments   Labs (all labs ordered are listed, but only abnormal results are displayed) Labs Reviewed  URINALYSIS, ROUTINE W REFLEX MICROSCOPIC - Abnormal; Notable for the following components:      Result Value   Color, Urine  STRAW (*)    All other components within normal limits  CBC WITH DIFFERENTIAL/PLATELET - Abnormal; Notable for the following components:   Hemoglobin 17.4 (*)    Monocytes Absolute 1.5 (*)    All other components within normal limits  BASIC METABOLIC PANEL - Abnormal;  Notable for the following components:   Glucose, Bld 123 (*)    All other components within normal limits    EKG None  Radiology CT HEMATURIA WORKUP  Result Date: 04/11/2023 CLINICAL DATA:  Chronic bilateral groin pain and hematuria. EXAM: CT ABDOMEN AND PELVIS WITHOUT AND WITH CONTRAST TECHNIQUE: Multidetector CT imaging of the abdomen and pelvis was performed following the standard protocol before and following the bolus administration of intravenous contrast. RADIATION DOSE REDUCTION: This exam was performed according to the departmental dose-optimization program which includes automated exposure control, adjustment of the mA and/or kV according to patient size and/or use of iterative reconstruction technique. CONTRAST:  OMNIPAQUE IOHEXOL 300 MG/ML  SOLN COMPARISON:  None Available. FINDINGS: Lower chest: There is some linear opacity lung bases likely scar or atelectasis. No pleural effusion. Hepatobiliary: Diffuse fatty liver infiltration. Stones in the nondilated gallbladder. Pancreas: Mild global pancreatic atrophy. Spleen: Normal in size without focal abnormality. Adrenals/Urinary Tract: Adrenal glands are preserved. No abnormal calcifications are seen within either kidney nor along the course of either ureter. No enhancing renal mass. No collecting system dilatation. On the delayed dataset there is preserved appearance of the calices which are delicately cupped bilaterally. Prominent bilateral renal sinus fat. The renal pelvis and ureters have normal course and caliber down to the bladder. No urothelial thickening, dilatation or filling defect. Bladder wall is slightly thickened and trabeculated but the bladder is  underdistended. Stomach/Bowel: On this non oral contrast exam the stomach is underdistended. Small bowel is nondilated. Large bowel has a normal course and caliber with scattered stool. Normal retrocecal appendix Vascular/Lymphatic: Aortic atherosclerosis. No enlarged abdominal or pelvic lymph nodes. Reproductive: Heterogeneous prostate with slight mass effect along the bladder. Other: No free air or free fluid. Small fat containing umbilical hernia. There is also a separate midline epigastric anterior abdominal wall fat containing hernia. Musculoskeletal: Degenerative changes seen of the spine and pelvis. Moderate changes in the lumbar spine. Bridging osteophytes along the right sacroiliac joint. Moderate changes as well of the hips. IMPRESSION: No obstructing renal stone, enhancing renal mass or collecting system filling defect. Slight prominence of the prostate. The bladder wall slightly thickened. Fatty liver infiltration. No bowel obstruction, free air or free fluid. Midline anterior abdominal wall fat containing hernias. Electronically Signed   By: Karen Kays M.D.   On: 04/11/2023 14:53   US SCROTUM W/DOPPLER  Result Date: 04/11/2023 CLINICAL DATA:  Bilateral testicular pain for 2 months EXAM: SCROTAL ULTRASOUND DOPPLER ULTRASOUND OF THE TESTICLES TECHNIQUE: Complete ultrasound examination of the testicles, epididymis, and other scrotal structures was performed. Color and spectral Doppler ultrasound were also utilized to evaluate blood flow to the testicles. COMPARISON:  None Available. FINDINGS: Right testicle Measurements: 3.3 x 1.5 x 2.9 cm. No mass or microlithiasis visualized. Left testicle Measurements: 3.4 x 1.6 x 2.8 cm. No mass or microlithiasis visualized. Right epididymis: Right-sided epididymal cyst or spermatocele measuring 5 mm. Left epididymis: Multiple left-sided epididymal cysts or spermatoceles measuring up to 20 mm. Hydrocele:  Small left hydrocele Varicocele:  None visualized. Pulsed  Doppler interrogation of both testes demonstrates normal low resistance arterial and venous waveforms bilaterally. IMPRESSION: Bilateral epididymal cysts or spermatoceles. Focus on the left measures up to 2 cm. Small left hydrocele. Preserved testicular parenchyma and blood flow on Doppler. Electronically Signed   By: Karen Kays M.D.   On: 04/11/2023 14:45    Procedures Procedures    Medications Ordered in ED Medications  iohexol (OMNIPAQUE) 300 MG/ML solution 125  mL (125 mLs Intravenous Contrast Given 04/11/23 1403)    ED Course/ Medical Decision Making/ A&P                                 Medical Decision Making This patient presents to the ED for concern of perineal and testicular pressure and discomfort x 1 month, this involves an extensive number of treatment options, and is a complaint that carries with it a high risk of complications and morbidity.  The differential diagnosis includes UTI, epididymitis, orchitis, prostatitis, rectocele, other    Additional history obtained:  Additional history obtained from EMR External records from outside source obtained and reviewed including recent urgent care notes, urology notes   Lab Tests:  I Ordered, and personally interpreted labs.  The pertinent results include: BC shows no leukocytosis, there is a slight evidence around his baseline, BMP is reassuring, urinalysis is normal no UTI no hematuria    Problem List / ED Course / Critical interventions / Medication management  Has been having a month of pressure around his perineal area and testicles, no swelling, ultrasound of testicles shows bilateral spermatoceles versus epididymal cysts and small left hydrocele, no torsion and CT.  Workup was ordered as this was going to be ordered by urology but patient had not had opportunity to schedule it yet, patient has slight prominence of yesterday and some bladder thickening.  I relayed these results to the patient, we discussed that he may  ultimately need a cystoscopy given his hematuria a couple of weeks ago and need for urology follow-up for above findings.  He has appointment but is not until December, he is going to call for further guidance since he is already established care there.  Discussed follow-up and strict return precautions.  Plan was for NSAIDs but patient has allergy to NSAIDs so we will give small amount of Norco for when pain is more severe and not relieved with Tylenol.  I have reviewed the patients home medicines and have made adjustments as needed   Amount and/or Complexity of Data Reviewed Labs: ordered. Radiology: ordered.  Risk Prescription drug management.           Final Clinical Impression(s) / ED Diagnoses Final diagnoses:  Hydrocele of testis    Rx / DC Orders ED Discharge Orders          Ordered    HYDROcodone-acetaminophen (NORCO) 5-325 MG tablet  Every 6 hours PRN        04/11/23 1516              Ma Rings, PA-C 04/11/23 1522    Glendora Score, MD 04/11/23 2238

## 2023-04-22 ENCOUNTER — Other Ambulatory Visit: Payer: Self-pay | Admitting: Urology

## 2023-04-22 DIAGNOSIS — R7989 Other specified abnormal findings of blood chemistry: Secondary | ICD-10-CM

## 2023-04-22 DIAGNOSIS — D751 Secondary polycythemia: Secondary | ICD-10-CM

## 2023-04-29 ENCOUNTER — Other Ambulatory Visit: Payer: Self-pay

## 2023-04-29 MED ORDER — GABAPENTIN 300 MG PO CAPS: 300.0000 mg | ORAL_CAPSULE | Freq: Three times a day (TID) | ORAL | 0 refills | Status: DC

## 2023-05-01 ENCOUNTER — Ambulatory Visit: Payer: 59 | Admitting: Nutrition

## 2023-05-07 ENCOUNTER — Ambulatory Visit (INDEPENDENT_AMBULATORY_CARE_PROVIDER_SITE_OTHER): Payer: 59 | Admitting: Internal Medicine

## 2023-05-07 ENCOUNTER — Encounter: Payer: Self-pay | Admitting: Internal Medicine

## 2023-05-07 VITALS — BP 126/77 | HR 79 | Ht 71.5 in | Wt 349.0 lb

## 2023-05-07 DIAGNOSIS — G4733 Obstructive sleep apnea (adult) (pediatric): Secondary | ICD-10-CM | POA: Diagnosis not present

## 2023-05-07 DIAGNOSIS — E119 Type 2 diabetes mellitus without complications: Secondary | ICD-10-CM | POA: Diagnosis not present

## 2023-05-07 DIAGNOSIS — R7989 Other specified abnormal findings of blood chemistry: Secondary | ICD-10-CM | POA: Diagnosis not present

## 2023-05-07 DIAGNOSIS — K219 Gastro-esophageal reflux disease without esophagitis: Secondary | ICD-10-CM

## 2023-05-07 DIAGNOSIS — G629 Polyneuropathy, unspecified: Secondary | ICD-10-CM

## 2023-05-07 DIAGNOSIS — E1165 Type 2 diabetes mellitus with hyperglycemia: Secondary | ICD-10-CM | POA: Insufficient documentation

## 2023-05-07 MED ORDER — OZEMPIC (0.25 OR 0.5 MG/DOSE) 2 MG/3ML ~~LOC~~ SOPN
0.2500 mg | PEN_INJECTOR | SUBCUTANEOUS | 0 refills | Status: DC
Start: 2023-05-07 — End: 2023-07-04

## 2023-05-07 NOTE — Assessment & Plan Note (Signed)
TSH 7.1 on labs from March.  He is asymptomatic currently.  No prior history of hypothyroidism.  Repeat TFTs ordered today.

## 2023-05-07 NOTE — Assessment & Plan Note (Signed)
Symptoms are adequately controlled with Protonix and Pepcid.  No medication changes are indicated today.

## 2023-05-07 NOTE — Assessment & Plan Note (Signed)
New diagnosis, meeting criteria with A1c 6.5 on labs from July.  Ozempic was discussed but does not appear to have been prescribed. -Through shared decision making, Ozempic 0.25 mg weekly has been prescribed today. -Urine microalbumin/creatinine ratio ordered

## 2023-05-07 NOTE — Patient Instructions (Signed)
It was a pleasure to see you today.  Thank you for giving [place] the opportunity to be involved in your care.  Below is a brief recap of your visit and next steps.  We will plan to see you again in 3 months.  Summary Start Ozempic for diabetes / weight loss Repeat labs and urine study Follow up in 3 months

## 2023-05-07 NOTE — Assessment & Plan Note (Signed)
Recently seen by sleep medicine for follow-up.  He endorses nightly compliance with CPAP.

## 2023-05-07 NOTE — Progress Notes (Signed)
Established Patient Office Visit  Subjective   Patient ID: Jesse Castillo, male    DOB: 03/04/1966  Age: 57 y.o. MRN: [id number]  Chief Complaint  Patient presents with   Gastroesophageal Reflux    Six week follow up    Jesse Castillo returns to care today for routine follow-up.  He was last evaluated at [facility] by Dr. Barbaraann Faster on 7/29 as a new patient presenting to establish care.  Baseline labs were ordered, revealing a new diagnosis of diabetes mellitus, and 70-month follow-up arranged.  In the interim, he presented to the emergency department on 2 occasions, most recently 10/10 endorsing pain and pressure in his testicles and perineal area.  Imaging was concerning for left sided epididymal cysts or spermatoceles, and a small left hydrocele.  Outpatient urology follow-up recommended.  There have otherwise been no acute interval events.Jesse Castillo reports feeling fairly well today.  His acute concern is poorly controlled symptoms of neuropathy.  He is scheduled to see neurology next week (11/12) for evaluation.  Past Medical History:  Diagnosis Date   Enlarged prostate    GERD (gastroesophageal reflux disease)    Hypertension    Neuropathy    RLE   Pneumonia due to COVID-19 virus 07/2020   Restless leg    Sleep apnea    Tremor of left hand    Past Surgical History:  Procedure Laterality Date   HERNIA REPAIR     Social History   Tobacco Use   Smoking status: Former    Current packs/day: 0.00    Average packs/day: 3.0 packs/day for 18.0 years (54.0 ttl pk-yrs)    Types: Cigarettes    Start date: 07/02/1989    Quit date: 07/03/2007    Years since quitting: 15.8   Smokeless tobacco: Never  Vaping Use   Vaping status: Never Used  Substance Use Topics   Alcohol use: Never   Drug use: Never   Family History  Problem Relation Age of Onset   Heart disease Mother    Tremor Father    Hypertension Daughter    Heart disease Maternal Grandmother    Parkinson's disease Paternal Grandmother     Allergies  Allergen Reactions   Mobic [Meloxicam] Other (See Comments)    Mouth sores   Review of Systems  Neurological:  Positive for tingling (Neuropathic pain in both legs).  All other systems reviewed and are negative.    Objective:     BP 126/77 (BP Location: Left Arm, Patient Position: Sitting, Cuff Size: Large)   Pulse 79   Ht 5' 11.5" (1.816 m)   Wt (!) 349 lb (158.3 kg)   SpO2 97%   BMI 48.00 kg/m  BP Readings from Last 3 Encounters:  05/07/23 126/77  04/11/23 123/74  03/19/23 (!) 146/81   Physical Exam Vitals reviewed.  Constitutional:      General: He is not in acute distress.    Appearance: Normal appearance. He is obese. He is not ill-appearing.  HENT:     Head: Normocephalic and atraumatic.     Right Ear: External ear normal.     Left Ear: External ear normal.     Nose: Nose normal. No congestion or rhinorrhea.     Mouth/Throat:     Mouth: Mucous membranes are moist.     Pharynx: Oropharynx is clear.  Eyes:     General: No scleral icterus.    Extraocular Movements: Extraocular movements intact.     Conjunctiva/sclera: Conjunctivae normal.  Pupils: Pupils are equal, round, and reactive to light.  Cardiovascular:     Rate and Rhythm: Normal rate and regular rhythm.     Pulses: Normal pulses.     Heart sounds: Normal heart sounds. No murmur heard. Pulmonary:     Effort: Pulmonary effort is normal.     Breath sounds: Normal breath sounds. No wheezing, rhonchi or rales.  Abdominal:     General: Abdomen is flat. Bowel sounds are normal. There is no distension.     Palpations: Abdomen is soft.     Tenderness: There is no abdominal tenderness.  Musculoskeletal:        General: No swelling or deformity. Normal range of motion.     Cervical back: Normal range of motion.  Skin:    General: Skin is warm and dry.     Capillary Refill: Capillary refill takes less than 2 seconds.  Neurological:     General: No focal deficit present.     Mental  Status: He is alert and oriented to person, place, and time.     Motor: No weakness.  Psychiatric:        Mood and Affect: Mood normal.        Behavior: Behavior normal.        Thought Content: Thought content normal.   Last CBC Lab Results  Component Value Date   WBC 9.3 04/11/2023   HGB 17.4 (H) 04/11/2023   HCT 50.4 04/11/2023   MCV 92.8 04/11/2023   MCH 32.0 04/11/2023   RDW 13.5 04/11/2023   PLT 231 04/11/2023   Last metabolic panel Lab Results  Component Value Date   GLUCOSE 123 (H) 04/11/2023   NA 135 04/11/2023   K 3.6 04/11/2023   CL 99 04/11/2023   CO2 27 04/11/2023   BUN 18 04/11/2023   CREATININE 1.11 04/11/2023   GFRNONAA >60 04/11/2023   CALCIUM 9.3 04/11/2023   PROT 6.7 01/28/2023   ALBUMIN 4.2 01/28/2023   LABGLOB 2.5 01/28/2023   AGRATIO 1.8 03/07/2022   BILITOT 0.4 01/28/2023   ALKPHOS 103 01/28/2023   AST 49 (H) 01/28/2023   ALT 58 (H) 01/28/2023   ANIONGAP 9 04/11/2023   Last lipids Lab Results  Component Value Date   CHOL 173 01/28/2023   HDL 25 (L) 01/28/2023   LDLCALC 67 01/28/2023   TRIG 526 (H) 01/28/2023   CHOLHDL 6.9 (H) 01/28/2023   Last hemoglobin A1c Lab Results  Component Value Date   HGBA1C 6.5 (A) 01/28/2023   Last thyroid functions Lab Results  Component Value Date   TSH 7.190 (H) 09/19/2022   Last vitamin B12 and Folate Lab Results  Component Value Date   VITAMINB12 556 03/07/2022   FOLATE >20.0 03/07/2022   The 10-year ASCVD risk score (Arnett DK, et al., 2019) is: 16.9%    Assessment & Plan:   Problem List Items Addressed This Visit       OSA on CPAP (Chronic)    Recently seen by sleep medicine for follow-up.  He endorses nightly compliance with CPAP.      Gastroesophageal reflux disease    Symptoms are adequately controlled with Protonix and Pepcid.  No medication changes are indicated today.      Type 2 diabetes mellitus without complications (HCC) - Primary    New diagnosis, meeting criteria with  A1c 6.5 on labs from July.  Ozempic was discussed but does not appear to have been prescribed. -Through shared decision making, Ozempic 0.25 mg weekly  has been prescribed today. -Urine microalbumin/creatinine ratio ordered      Neuropathy    He endorses poorly controlled symptoms of peripheral neuropathy.  He will be evaluated by neurology next week (11/12).  He is currently taking gabapentin 300 mg 3 times daily, but feels that pain is poorly controlled.  I recommended increasing gabapentin to 600 mg nightly and following up with neurology as scheduled on 11/12.      Abnormal TSH    TSH 7.1 on labs from March.  He is asymptomatic currently.  No prior history of hypothyroidism.  Repeat TFTs ordered today.      Return in about 3 months (around 08/07/2023).   Billie Lade, MD

## 2023-05-07 NOTE — Assessment & Plan Note (Signed)
He endorses poorly controlled symptoms of peripheral neuropathy.  He will be evaluated by neurology next week (11/12).  He is currently taking gabapentin 300 mg 3 times daily, but feels that pain is poorly controlled.  I recommended increasing gabapentin to 600 mg nightly and following up with neurology as scheduled on 11/12.

## 2023-05-09 ENCOUNTER — Encounter: Payer: Self-pay | Admitting: Urology

## 2023-05-09 ENCOUNTER — Other Ambulatory Visit: Payer: Self-pay

## 2023-05-09 DIAGNOSIS — E119 Type 2 diabetes mellitus without complications: Secondary | ICD-10-CM

## 2023-05-09 LAB — MICROALBUMIN / CREATININE URINE RATIO
Creatinine, Urine: 57.3 mg/dL
Microalb/Creat Ratio: <5 mg/g{creat} (ref 0–29)
Microalbumin, Urine: <3 ug/mL

## 2023-05-09 LAB — TSH+FREE T4
Free T4: 1.1 ng/dL (ref 0.82–1.77)
TSH: 3.13 u[IU]/mL (ref 0.450–4.500)

## 2023-05-14 ENCOUNTER — Institutional Professional Consult (permissible substitution): Payer: 59 | Admitting: Neurology

## 2023-05-14 ENCOUNTER — Telehealth: Payer: Self-pay | Admitting: Neurology

## 2023-05-14 ENCOUNTER — Ambulatory Visit: Payer: 59 | Admitting: Internal Medicine

## 2023-05-14 NOTE — Telephone Encounter (Signed)
LVM and sent mychart msg informing pt of need to reschedule 05/14/23 appt - MD out

## 2023-05-15 NOTE — Telephone Encounter (Signed)
Unable to reach patient.

## 2023-05-15 NOTE — Telephone Encounter (Signed)
Copied from CRM [id number]. Topic: Clinical - Medication Question >> May 15, 2023  9:49 AM Almira Coaster wrote: Reason for CRM: Patient neurologist cancelled patient's appointment and he would like to know if Dr.Dixon can up his gabapentin (NEURONTIN) 300 MG capsule from 300MG  to 600MG  as previously discussed. Patient would also like to know the status of the pre-auth for his Ozempic.

## 2023-05-22 ENCOUNTER — Telehealth: Payer: Self-pay

## 2023-05-22 NOTE — Telephone Encounter (Signed)
lmtrc

## 2023-05-22 NOTE — Telephone Encounter (Signed)
Copied from CRM [id number]. Topic: Clinical - Medication Question >> May 22, 2023  1:23 PM Alcus Dad H wrote: Reason for CRM: Pt says Dr. Durwin Nora is supposed to increase his dose of gabapentin to 600mg , he's currently been taking 2 of his 300mg  and is almost out and needs a refill. Also would like to know the status of the pre-auth for his ozempic.

## 2023-05-27 ENCOUNTER — Other Ambulatory Visit: Payer: Self-pay | Admitting: Internal Medicine

## 2023-05-27 DIAGNOSIS — K219 Gastro-esophageal reflux disease without esophagitis: Secondary | ICD-10-CM

## 2023-06-13 ENCOUNTER — Other Ambulatory Visit: Payer: 59

## 2023-06-13 DIAGNOSIS — E291 Testicular hypofunction: Secondary | ICD-10-CM

## 2023-06-14 LAB — PSA, TOTAL AND FREE
PSA, Free Pct: 46 %
PSA, Free: 0.23 ng/mL
Prostate Specific Ag, Serum: 0.5 ng/mL (ref 0.0–4.0)

## 2023-06-14 LAB — TESTOSTERONE: Testosterone: 52 ng/dL — ABNORMAL LOW (ref 264–916)

## 2023-06-14 LAB — HEMOGLOBIN AND HEMATOCRIT, BLOOD
Hematocrit: 47.5 % (ref 37.5–51.0)
Hemoglobin: 16.3 g/dL (ref 13.0–17.7)

## 2023-06-18 ENCOUNTER — Ambulatory Visit: Payer: 59 | Admitting: Nutrition

## 2023-06-20 ENCOUNTER — Encounter: Payer: Self-pay | Admitting: Internal Medicine

## 2023-06-20 ENCOUNTER — Ambulatory Visit (INDEPENDENT_AMBULATORY_CARE_PROVIDER_SITE_OTHER): Payer: 59 | Admitting: Urology

## 2023-06-20 ENCOUNTER — Other Ambulatory Visit: Payer: Self-pay | Admitting: Internal Medicine

## 2023-06-20 ENCOUNTER — Ambulatory Visit: Payer: 59 | Attending: Internal Medicine | Admitting: Internal Medicine

## 2023-06-20 VITALS — BP 144/83 | HR 80

## 2023-06-20 VITALS — BP 132/80 | HR 84 | Ht 71.0 in | Wt 352.0 lb

## 2023-06-20 DIAGNOSIS — R351 Nocturia: Secondary | ICD-10-CM | POA: Diagnosis not present

## 2023-06-20 DIAGNOSIS — G629 Polyneuropathy, unspecified: Secondary | ICD-10-CM

## 2023-06-20 DIAGNOSIS — N401 Enlarged prostate with lower urinary tract symptoms: Secondary | ICD-10-CM

## 2023-06-20 DIAGNOSIS — R3915 Urgency of urination: Secondary | ICD-10-CM

## 2023-06-20 DIAGNOSIS — I10 Essential (primary) hypertension: Secondary | ICD-10-CM

## 2023-06-20 DIAGNOSIS — E781 Pure hyperglyceridemia: Secondary | ICD-10-CM | POA: Diagnosis not present

## 2023-06-20 DIAGNOSIS — E782 Mixed hyperlipidemia: Secondary | ICD-10-CM | POA: Diagnosis not present

## 2023-06-20 DIAGNOSIS — N529 Male erectile dysfunction, unspecified: Secondary | ICD-10-CM

## 2023-06-20 DIAGNOSIS — R7989 Other specified abnormal findings of blood chemistry: Secondary | ICD-10-CM

## 2023-06-20 DIAGNOSIS — E291 Testicular hypofunction: Secondary | ICD-10-CM

## 2023-06-20 DIAGNOSIS — D751 Secondary polycythemia: Secondary | ICD-10-CM | POA: Diagnosis not present

## 2023-06-20 DIAGNOSIS — R310 Gross hematuria: Secondary | ICD-10-CM | POA: Diagnosis not present

## 2023-06-20 LAB — URINALYSIS, ROUTINE W REFLEX MICROSCOPIC
Bilirubin, UA: NEGATIVE
Ketones, UA: NEGATIVE
Leukocytes,UA: NEGATIVE
Nitrite, UA: NEGATIVE
Protein,UA: NEGATIVE
RBC, UA: NEGATIVE
Specific Gravity, UA: 1.015 (ref 1.005–1.030)
Urobilinogen, Ur: 0.2 mg/dL (ref 0.2–1.0)
pH, UA: 6 (ref 5.0–7.5)

## 2023-06-20 MED ORDER — GABAPENTIN 600 MG PO TABS: 600.0000 mg | ORAL_TABLET | Freq: Three times a day (TID) | ORAL | 2 refills | Status: DC

## 2023-06-20 MED ORDER — ICOSAPENT ETHYL 1 G PO CAPS: 2.0000 g | ORAL_CAPSULE | Freq: Two times a day (BID) | ORAL | 3 refills | Status: DC

## 2023-06-20 MED ORDER — TADALAFIL 5 MG PO TABS: 5.0000 mg | ORAL_TABLET | Freq: Every day | ORAL | 11 refills | Status: AC | PRN

## 2023-06-20 MED ORDER — SILDENAFIL CITRATE 100 MG PO TABS: 100.0000 mg | ORAL_TABLET | Freq: Every day | ORAL | 11 refills | Status: DC | PRN

## 2023-06-20 MED ORDER — TESTOSTERONE CYPIONATE 200 MG/ML IM SOLN: 100.0000 mg | INTRAMUSCULAR | 1 refills | Status: DC

## 2023-06-20 NOTE — Progress Notes (Signed)
Cardiology Office Note  Date: 06/21/2023   ID: Jesse Castillo, DOB March 31, 1966, MRN [id number]  PCP:  Jesse Lade, MD  Cardiologist:  Jesse Bicker, MD Electrophysiologist:  None    History of Present Illness: Jesse Castillo is a 57 y.o. male known to have HTN, OSA on CPAP, moderate CAD with negative FFR is here for follow-up visit  Continues to have DOE but a lot better.  He takes Lasix when he gets lower EXTR swelling.  No angina, dizziness, syncope, palpitations.  He quit smoking in early 2000's. He underwent LHC in the early 2000's at [facility] and was told he had 20% blockages.  His grandfather had the third transplant in the country later he passed away.  His mother had CAD.  Past Medical History:  Diagnosis Date   Enlarged prostate    GERD (gastroesophageal reflux disease)    Hypertension    Neuropathy    RLE   Pneumonia due to COVID-19 virus 07/2020   Restless leg    Sleep apnea    Tremor of left hand     Past Surgical History:  Procedure Laterality Date   HERNIA REPAIR      Current Outpatient Medications  Medication Sig Dispense Refill   amLODipine (NORVASC) 5 MG tablet TAKE ONE (1) TABLET BY MOUTH EVERY DAY 90 tablet 3   aspirin EC 81 MG tablet Take 81 mg by mouth daily.     famotidine (PEPCID) 40 MG tablet TAKE ONE TABLET BY MOUTH EVERY NIGHT AT BEDTIME 90 tablet 0   fluticasone (FLONASE) 50 MCG/ACT nasal spray Place 2 sprays into both nostrils daily. 16 g 6   furosemide (LASIX) 80 MG tablet Take 1 tablet (80 mg total) by mouth 2 (two) times daily. 180 tablet 3   HYDROcodone-acetaminophen (NORCO) 5-325 MG tablet Take 1 tablet by mouth every 6 (six) hours as needed for moderate pain. 6 tablet 0   icosapent Ethyl (VASCEPA) 1 g capsule Take 2 capsules (2 g total) by mouth 2 (two) times daily. 360 capsule 3   Misc Natural Products (URINOZINC PLUS PO) Take by mouth.     montelukast (SINGULAIR) 10 MG tablet Take 1 tablet (10 mg total) by mouth at  bedtime. 90 tablet 3   Multiple Vitamins-Minerals (MULTIVITAMIN MEN PO) Take by mouth.     NEEDLE, DISP, 23 G (BD DISP NEEDLE) 23G X 1" MISC Use every 14 days with testosterone 50 each 1   pantoprazole (PROTONIX) 40 MG tablet Take 1 tablet (40 mg total) by mouth 2 (two) times daily. 180 tablet 0   potassium chloride SA (KLOR-CON M) 20 MEQ tablet Take 1 tablet (20 mEq total) by mouth daily. 90 tablet 3   Probiotic Product (HEALTHY COLON PO) Take by mouth.     propranolol ER (INDERAL LA) 60 MG 24 hr capsule Take 1 capsule (60 mg total) by mouth daily. 30 capsule 5   rOPINIRole (REQUIP) 4 MG tablet Take 1 tablet (4 mg total) by mouth at bedtime. 90 tablet 3   Semaglutide,0.25 or 0.5MG /DOS, (OZEMPIC, 0.25 OR 0.5 MG/DOSE,) 2 MG/3ML SOPN Inject 0.25 mg into the skin once a week. 3 mL 0   Specialty Vitamins Products (ECHINACEA C COMPLETE PO) Take by mouth.     Syringe/Needle, Disp, 18G X 1" 3 ML MISC 1 Device by Does not apply route every 14 (fourteen) days. 12 each 1   tadalafil (CIALIS) 10 MG tablet Take 10 mg by mouth daily as needed  for erectile dysfunction.     traZODone (DESYREL) 100 MG tablet TAKE ONE TABLET (100MG  TOTAL) BY MOUTH AT BEDTIME 90 tablet 3   gabapentin (NEURONTIN) 600 MG tablet Take 1 tablet (600 mg total) by mouth 3 (three) times daily. 90 tablet 2   sildenafil (VIAGRA) 100 MG tablet Take 1 tablet (100 mg total) by mouth daily as needed. 30 tablet 11   tadalafil (CIALIS) 5 MG tablet Take 1 tablet (5 mg total) by mouth daily as needed for erectile dysfunction. 30 tablet 11   testosterone cypionate (DEPOTESTOSTERONE CYPIONATE) 200 MG/ML injection Inject 0.5 mLs (100 mg total) into the muscle every 7 (seven) days. 10 mL 1   No current facility-administered medications for this visit.   Allergies:  Mobic [meloxicam]   Social History: The patient  reports that he quit smoking about 15 years ago. His smoking use included cigarettes. He started smoking about 33 years ago. He has a 54  pack-year smoking history. He has never used smokeless tobacco. He reports that he does not drink alcohol and does not use drugs.   Family History: The patient's family history includes Heart disease in his maternal grandmother and mother; Hypertension in his daughter; Parkinson's disease in his paternal grandmother; Tremor in his father.   ROS:  Please see the history of present illness. Otherwise, complete review of systems is positive for none.  All other systems are reviewed and negative.   Physical Exam: VS:  BP 132/80   Pulse 84   Ht 5\' 11"  (1.803 m)   Wt (!) 352 lb (159.7 kg)   SpO2 95%   BMI 49.09 kg/m , BMI Body mass index is 49.09 kg/m.  Wt Readings from Last 3 Encounters:  06/20/23 (!) 352 lb (159.7 kg)  05/07/23 (!) 349 lb (158.3 kg)  04/11/23 (!) 350 lb (158.8 kg)    General: Patient appears comfortable at rest. HEENT: Conjunctiva and lids normal, oropharynx clear with moist mucosa. Neck: Supple, JVD not examined due to body habitus Lungs: Clear to auscultation, nonlabored breathing at rest. Cardiac: Regular rate and rhythm, no S3 or significant systolic murmur, no pericardial rub. Abdomen: Soft, nontender, no hepatomegaly, bowel sounds present, no guarding or rebound. Extremities: 1+ pitting edema, distal pulses 2+. Skin: Warm and dry. Musculoskeletal: No kyphosis. Neuropsychiatric: Alert and oriented x3, affect grossly appropriate.  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  PFTs from 08/2022 showed respiratory lung defect from obesity.  Continues to take p.o. Lasix when he has lower EXTR swelling.  Continues to have symptoms of DOE which she understands is from obesity.  Moderate CAD with negative FFR: No angina.  Continue aspirin 81 mg once daily.  Hypertriglyceridemia without hypercholesterolemia: Repeat lipid panel from 7/24 that showed TG 526, LDL 67.  Start Vascepa 2 g twice daily.  Repeat lipid panel in 3 months. Hb A1C 6.5, 4 months ago.  HTN, controlled: Continue current antihypertensives, amlodipine 5 mg once daily,  follows with PCP.  OSA on CPAP: Continue CPAP.    Medication Adjustments/Labs and Tests Ordered: Current medicines are reviewed at length with the patient today.  Concerns regarding medicines are outlined above.   Tests Ordered: Orders Placed This Encounter  Procedures   Lipid panel   EKG 12-Lead    Medication Changes: Meds ordered this encounter  Medications   icosapent Ethyl (VASCEPA) 1 g capsule    Sig: Take 2 capsules (2 g total) by mouth 2 (two) times daily.    Dispense:  360 capsule    Refill:  3    Disposition:  Follow up 1 year  Signed, Kashmere Daywalt Verne Spurr, MD, 06/21/2023 11:43 AM    [facility] [address] [place], [place] [address]

## 2023-06-20 NOTE — Patient Instructions (Signed)
Medication Instructions:  Your physician has recommended you make the following change in your medication:   -Start Vascepa 2g twice daily.   *If you need a refill on your cardiac medications before your next appointment, please call your pharmacy*   Lab Work: In 4 months: - Fasting Lipid Panel  If you have labs (blood work) drawn today and your tests are completely normal, you will receive your results only by: MyChart Message (if you have MyChart) OR A paper copy in the mail If you have any lab test that is abnormal or we need to change your treatment, we will call you to review the results.   Testing/Procedures: None   Follow-Up: At [facility], you and your health needs are our priority.  As part of our continuing mission to provide you with exceptional heart care, we have created designated Provider Care Teams.  These Care Teams include your primary Cardiologist (physician) and Advanced Practice Providers (APPs -  Physician Assistants and Nurse Practitioners) who all work together to provide you with the care you need, when you need it.  We recommend signing up for the patient portal called "MyChart".  Sign up information is provided on this After Visit Summary.  MyChart is used to connect with patients for Virtual Visits (Telemedicine).  Patients are able to view lab/test results, encounter notes, upcoming appointments, etc.  Non-urgent messages can be sent to your provider as well.   To learn more about what you can do with MyChart, go to [email or web address].    Your next appointment:   1 year(s)  Provider:   You may see Vishnu P Mallipeddi, MD or one of the following Advanced Practice Providers on your designated Care Team:   [place], PA-C  Jacolyn Reedy, [place]     Other Instructions

## 2023-06-20 NOTE — Progress Notes (Signed)
Subjective: 1. Gross hematuria   2. Hypogonadism in male   3. Secondary polycythemia   4. Erectile dysfunction, unspecified erectile dysfunction type   5. BPH with urinary obstruction   6. Nocturia   7. Urgency of urination   8. Low testosterone in male     06/20/23: Jesse Castillo returns today in f/u.  He was in the ER on 04/11/23 for some genital pain and a history of recent gross hematuria.  He had been treated with bactrim for prostatitis and had no further bleeding.  He had a CT hematuria study in October and apart from prostate enlargement, there were no GU findings.  His UA had lg blood on 03/19/23 but was clear on 04/11/23.  He had labs on 06/13/23 and his Hgb was 16.3 down from 17.4 on 04/11/23 but his T was only 52 on 06/13/23.  His PSA was 0.5.  He had a scrotal US on 04/11/23 that showed bilateral epididymal cysts and spermatoceles.  He has a dull right tesicular ache with sitting.  He has persistent ED with inability to maintain despite combination oral therapy.    07/12/22: Jesse Castillo returns today in f/u.  He hasn't donated blood as requested.  He is scheduled by Dr. Craige Cotta to see hematology on 08/02/22. He has not had his testosterone injection in 2 weeks.  His Hgb on 1/3 was up to 18.5 with a T of 457.  He remains on tadalafil but went to 10mg  qod because of supply issues.  He uses the sildenafil 100mg  prn as well.   His IPSS is 11.  He has nocturia x 1.  He reports a slow stream but is primarily reporting post void dribbling.  He has had no hematuria.    GU hx: Jesse Castillo is a 57 yo male who is sent for a history of hypogonadism that is being managed with Testosterone cypionate 100mg  q1wk.  His level is 751 at a peak.  His Hgb is stab1e at 17.4 and he has been donating blood regularly.  He has improved energy with the med.  His PSA was 0.8 on 02/08/21.  He has moderate LUTS with an IPSS of 9.  He can have some hesitancy but his IPSS is 8 with nocturia x 2.  His PSA is 0.6.  He has no prior stones, UTI's or GU  surgery.   He has ED and is on sildenafil and  tadalafil with an improved response but he had decreased sensation difficulty with reaching a climax but that is better off of the Prozac. He doesn't have any problems with the injections.  He still has some issues with irritability. UA is clear.   ROS:  Review of Systems  HENT:  Positive for congestion.   Respiratory:  Positive for cough.   Musculoskeletal:  Positive for back pain.  Neurological:  Positive for weakness.  Endo/Heme/Allergies:  Positive for polydipsia.  Psychiatric/Behavioral:  Positive for memory loss.   All other systems reviewed and are negative.   Allergies  Allergen Reactions   Mobic [Meloxicam] Other (See Comments)    Mouth sores    Past Medical History:  Diagnosis Date   Enlarged prostate    GERD (gastroesophageal reflux disease)    Hypertension    Neuropathy    RLE   Pneumonia due to COVID-19 virus 07/2020   Restless leg    Sleep apnea    Tremor of left hand     Past Surgical History:  Procedure Laterality Date   HERNIA REPAIR  Social History   Socioeconomic History   Marital status: Married    Spouse name: Not on file   Number of children: Not on file   Years of education: Not on file   Highest education level: 12th grade  Occupational History   Occupation: Dentist work for [employer]  Tobacco Use   Smoking status: Former    Current packs/day: 0.00    Average packs/day: 3.0 packs/day for 18.0 years (54.0 ttl pk-yrs)    Types: Cigarettes    Start date: 07/02/1989    Quit date: 07/03/2007    Years since quitting: 15.9   Smokeless tobacco: Never  Vaping Use   Vaping status: Never Used  Substance and Sexual Activity   Alcohol use: Never   Drug use: Never   Sexual activity: Yes  Other Topics Concern   Not on file  Social History Narrative   Not on file   Social Drivers of Health   Financial Resource Strain: Low Risk  (09/19/2022)   Overall Financial Resource Strain (CARDIA)     Difficulty of Paying Living Expenses: Not very hard  Food Insecurity: Food Insecurity Present (09/19/2022)   Hunger Vital Sign    Worried About Running Out of Food in the Last Year: Sometimes true    Ran Out of Food in the Last Year: Sometimes true  Transportation Needs: No Transportation Needs (09/19/2022)   PRAPARE - Administrator, Civil Service (Medical): No    Lack of Transportation (Non-Medical): No  Physical Activity: Insufficiently Active (09/19/2022)   Exercise Vital Sign    Days of Exercise per Week: 5 days    Minutes of Exercise per Session: 10 min  Stress: Stress Concern Present (09/19/2022)   [name] of Occupational Health - Occupational Stress Questionnaire    Feeling of Stress : To some extent  Social Connections: Moderately Integrated (09/19/2022)   Social Connection and Isolation Panel [NHANES]    Frequency of Communication with Friends and Family: More than three times a week    Frequency of Social Gatherings with Friends and Family: Once a week    Attends Religious Services: 1 to 4 times per year    Active Member of [employer] or Organizations: No    Attends Engineer, structural: Not on file    Marital Status: Married  Catering manager Violence: Not At Risk (08/02/2022)   Humiliation, Afraid, Rape, and Kick questionnaire    Fear of Current or Ex-Partner: No    Emotionally Abused: No    Physically Abused: No    Sexually Abused: No    Family History  Problem Relation Age of Onset   Heart disease Mother    Tremor Father    Hypertension Daughter    Heart disease Maternal Grandmother    Parkinson's disease Paternal Grandmother     Anti-infectives: Anti-infectives (From admission, onward)    None       Current Outpatient Medications  Medication Sig Dispense Refill   amLODipine (NORVASC) 5 MG tablet TAKE ONE (1) TABLET BY MOUTH EVERY DAY 90 tablet 3   aspirin EC 81 MG tablet Take 81 mg by mouth daily.     famotidine (PEPCID) 40 MG  tablet TAKE ONE TABLET BY MOUTH EVERY NIGHT AT BEDTIME 90 tablet 0   fluticasone (FLONASE) 50 MCG/ACT nasal spray Place 2 sprays into both nostrils daily. 16 g 6   furosemide (LASIX) 80 MG tablet Take 1 tablet (80 mg total) by mouth 2 (two) times daily. 180  tablet 3   gabapentin (NEURONTIN) 600 MG tablet Take 1 tablet (600 mg total) by mouth 3 (three) times daily. 90 tablet 2   HYDROcodone-acetaminophen (NORCO) 5-325 MG tablet Take 1 tablet by mouth every 6 (six) hours as needed for moderate pain. 6 tablet 0   icosapent Ethyl (VASCEPA) 1 g capsule Take 2 capsules (2 g total) by mouth 2 (two) times daily. 360 capsule 3   Misc Natural Products (URINOZINC PLUS PO) Take by mouth.     montelukast (SINGULAIR) 10 MG tablet Take 1 tablet (10 mg total) by mouth at bedtime. 90 tablet 3   Multiple Vitamins-Minerals (MULTIVITAMIN MEN PO) Take by mouth.     NEEDLE, DISP, 23 G (BD DISP NEEDLE) 23G X 1" MISC Use every 14 days with testosterone 50 each 1   pantoprazole (PROTONIX) 40 MG tablet Take 1 tablet (40 mg total) by mouth 2 (two) times daily. 180 tablet 0   potassium chloride SA (KLOR-CON M) 20 MEQ tablet Take 1 tablet (20 mEq total) by mouth daily. 90 tablet 3   Probiotic Product (HEALTHY COLON PO) Take by mouth.     propranolol ER (INDERAL LA) 60 MG 24 hr capsule Take 1 capsule (60 mg total) by mouth daily. 30 capsule 5   rOPINIRole (REQUIP) 4 MG tablet Take 1 tablet (4 mg total) by mouth at bedtime. 90 tablet 3   Semaglutide,0.25 or 0.5MG /DOS, (OZEMPIC, 0.25 OR 0.5 MG/DOSE,) 2 MG/3ML SOPN Inject 0.25 mg into the skin once a week. 3 mL 0   Specialty Vitamins Products (ECHINACEA C COMPLETE PO) Take by mouth.     Syringe/Needle, Disp, 18G X 1" 3 ML MISC 1 Device by Does not apply route every 14 (fourteen) days. 12 each 1   tadalafil (CIALIS) 10 MG tablet Take 10 mg by mouth daily as needed for erectile dysfunction.     traZODone (DESYREL) 100 MG tablet TAKE ONE TABLET (100MG  TOTAL) BY MOUTH AT BEDTIME 90  tablet 3   sildenafil (VIAGRA) 100 MG tablet Take 1 tablet (100 mg total) by mouth daily as needed. 30 tablet 11   tadalafil (CIALIS) 5 MG tablet Take 1 tablet (5 mg total) by mouth daily as needed for erectile dysfunction. 30 tablet 11   testosterone cypionate (DEPOTESTOSTERONE CYPIONATE) 200 MG/ML injection Inject 0.5 mLs (100 mg total) into the muscle every 7 (seven) days. 10 mL 1   No current facility-administered medications for this visit.     Objective: Vital signs in last 24 hours: BP (!) 144/83   Pulse 80   Intake/Output from previous day: No intake/output data recorded. Intake/Output this shift: @IOTHISSHIFT @   Physical Exam Vitals reviewed.  Constitutional:      Appearance: Normal appearance. He is obese.  Neurological:     Mental Status: He is alert.     Lab Results:  Recent Results (from the past 2160 hours)  CBC with Differential     Status: Abnormal   Collection Time: 04/11/23 11:06 AM  Result Value Ref Range   WBC 9.3 4.0 - 10.5 K/uL   RBC 5.43 4.22 - 5.81 MIL/uL   Hemoglobin 17.4 (H) 13.0 - 17.0 g/dL   HCT 16.1 09.6 - 04.5 %   MCV 92.8 80.0 - 100.0 fL   MCH 32.0 26.0 - 34.0 pg   MCHC 34.5 30.0 - 36.0 g/dL   RDW 40.9 81.1 - 91.4 %   Platelets 231 150 - 400 K/uL   nRBC 0.0 0.0 - 0.2 %  Neutrophils Relative % 57 %   Neutro Abs 5.4 1.7 - 7.7 K/uL   Lymphocytes Relative 23 %   Lymphs Abs 2.1 0.7 - 4.0 K/uL   Monocytes Relative 17 %   Monocytes Absolute 1.5 (H) 0.1 - 1.0 K/uL   Eosinophils Relative 2 %   Eosinophils Absolute 0.1 0.0 - 0.5 K/uL   Basophils Relative 1 %   Basophils Absolute 0.1 0.0 - 0.1 K/uL   Immature Granulocytes 0 %   Abs Immature Granulocytes 0.03 0.00 - 0.07 K/uL    Comment: Performed at [facility], [address] [place], [place] [address]  Basic metabolic panel     Status: Abnormal   Collection Time: 04/11/23 11:06 AM  Result Value Ref Range   Sodium 135 135 - 145 mmol/L   Potassium 3.6 3.5 - 5.1 mmol/L   Chloride 99  98 - 111 mmol/L   CO2 27 22 - 32 mmol/L   Glucose, Bld 123 (H) 70 - 99 mg/dL    Comment: Glucose reference range applies only to samples taken after fasting for at least 8 hours.   BUN 18 6 - 20 mg/dL   Creatinine, Ser 8.46 0.61 - 1.24 mg/dL   Calcium 9.3 8.9 - 96.2 mg/dL   GFR, Estimated >95 >28 mL/min    Comment: (NOTE) Calculated using the CKD-EPI Creatinine Equation (2021)    Anion gap 9 5 - 15    Comment: Performed at [facility], [address] [place], [place] [address]  Urinalysis, Routine w reflex microscopic -Urine, Clean Catch     Status: Abnormal   Collection Time: 04/11/23 11:10 AM  Result Value Ref Range   Color, Urine STRAW (A) YELLOW   APPearance CLEAR CLEAR   Specific Gravity, Urine 1.008 1.005 - 1.030   pH 6.0 5.0 - 8.0   Glucose, UA NEGATIVE NEGATIVE mg/dL   Hgb urine dipstick NEGATIVE NEGATIVE   Bilirubin Urine NEGATIVE NEGATIVE   Ketones, ur NEGATIVE NEGATIVE mg/dL   Protein, ur NEGATIVE NEGATIVE mg/dL   Nitrite NEGATIVE NEGATIVE   Leukocytes,Ua NEGATIVE NEGATIVE    Comment: Performed at [facility], [address], [place], [place] [address]  TSH + free T4     Status: None   Collection Time: 05/07/23  1:52 PM  Result Value Ref Range   TSH 3.130 0.450 - 4.500 uIU/mL   Free T4 1.10 0.82 - 1.77 ng/dL  Urine Microalbumin w/creat. ratio     Status: None   Collection Time: 05/07/23  1:52 PM  Result Value Ref Range   Creatinine, Urine 57.3 Not Estab. mg/dL   Microalbumin, Urine <7.2 Not Estab. ug/mL   Microalb/Creat Ratio <5 0 - 29 mg/g creat    Comment:                        Normal:                0 -  29                        Moderately increased: 30 - 300                        Severely increased:       >300   PSA, total and free     Status: None   Collection Time: 06/13/23  8:47 AM  Result Value Ref Range   Prostate Specific Ag,  Serum 0.5 0.0 - 4.0 ng/mL    Comment: Roche ECLIA methodology. According to the American Urological Association, Serum  PSA should decrease and remain at undetectable levels after radical prostatectomy. The AUA defines biochemical recurrence as an initial PSA value 0.2 ng/mL or greater followed by a subsequent confirmatory PSA value 0.2 ng/mL or greater. Values obtained with different assay methods or kits cannot be used interchangeably. Results cannot be interpreted as absolute evidence of the presence or absence of malignant disease.    PSA, Free 0.23 N/A ng/mL    Comment: Roche ECLIA methodology.   PSA, Free Pct 46.0 %    Comment: The table below lists the probability of prostate cancer for men with non-suspicious DRE results and total PSA between 4 and 10 ng/mL, by patient age Jesse Castillo, JAMA 1998, 098:1191).                   % Free PSA       50-64 yr        65-75 yr                   0.00-10.00%        56%             55%                  10.01-15.00%        24%             35%                  15.01-20.00%        17%             23%                  20.01-25.00%        10%             20%                       >25.00%         5%              9% Please note:  Catalona et al did not make specific               recommendations regarding the use of               percent free PSA for any other population               of men.   Testosterone     Status: Abnormal   Collection Time: 06/13/23  8:47 AM  Result Value Ref Range   Testosterone 52 (L) 264 - 916 ng/dL    Comment: Adult male reference interval is based on a population of healthy nonobese males (BMI <30) between 40 and 56 years old. Travison, et.al. JCEM [phone]. PMID: [id number].   Hemoglobin and hematocrit, blood     Status: None   Collection Time: 06/13/23  8:47 AM  Result Value Ref Range   Hemoglobin 16.3 13.0 - 17.7 g/dL   Hematocrit 28.4 13.2 - 51.0 %  Urinalysis, Routine w reflex microscopic     Status: Abnormal   Collection Time: 06/20/23  1:11 PM  Result Value Ref Range   Specific Gravity, UA 1.015 1.005 -  1.030   pH, UA 6.0 5.0 - 7.5   Color, UA Yellow  Yellow   Appearance Ur Clear Clear   Leukocytes,UA Negative Negative   Protein,UA Negative Negative/Trace   Glucose, UA 3+ (A) Negative   Ketones, UA Negative Negative   RBC, UA Negative Negative   Bilirubin, UA Negative Negative   Urobilinogen, Ur 0.2 0.2 - 1.0 mg/dL   Nitrite, UA Negative Negative   Microscopic Examination Comment     Comment: Microscopic not indicated and not performed.     BMET No results for input(s): "NA", "K", "CL", "CO2", "GLUCOSE", "BUN", "CREATININE", "CALCIUM" in the last 72 hours. PT/INR No results for input(s): "LABPROT", "INR" in the last 72 hours. ABG No results for input(s): "PHART", "HCO3" in the last 72 hours.  Invalid input(s): "PCO2", "PO2"  Studies/Results: No results found. CT HEMATURIA WORKUP Result Date: 04/11/2023 CLINICAL DATA:  Chronic bilateral groin pain and hematuria. EXAM: CT ABDOMEN AND PELVIS WITHOUT AND WITH CONTRAST TECHNIQUE: Multidetector CT imaging of the abdomen and pelvis was performed following the standard protocol before and following the bolus administration of intravenous contrast. RADIATION DOSE REDUCTION: This exam was performed according to the departmental dose-optimization program which includes automated exposure control, adjustment of the mA and/or kV according to patient size and/or use of iterative reconstruction technique. CONTRAST:  OMNIPAQUE IOHEXOL 300 MG/ML  SOLN COMPARISON:  None Available. FINDINGS: Lower chest: There is some linear opacity lung bases likely scar or atelectasis. No pleural effusion. Hepatobiliary: Diffuse fatty liver infiltration. Stones in the nondilated gallbladder. Pancreas: Mild global pancreatic atrophy. Spleen: Normal in size without focal abnormality. Adrenals/Urinary Tract: Adrenal glands are preserved. No abnormal calcifications are seen within either kidney nor along the course of either ureter. No enhancing renal mass. No  collecting system dilatation. On the delayed dataset there is preserved appearance of the calices which are delicately cupped bilaterally. Prominent bilateral renal sinus fat. The renal pelvis and ureters have normal course and caliber down to the bladder. No urothelial thickening, dilatation or filling defect. Bladder wall is slightly thickened and trabeculated but the bladder is underdistended. Stomach/Bowel: On this non oral contrast exam the stomach is underdistended. Small bowel is nondilated. Large bowel has a normal course and caliber with scattered stool. Normal retrocecal appendix Vascular/Lymphatic: Aortic atherosclerosis. No enlarged abdominal or pelvic lymph nodes. Reproductive: Heterogeneous prostate with slight mass effect along the bladder. Other: No free air or free fluid. Small fat containing umbilical hernia. There is also a separate midline epigastric anterior abdominal wall fat containing hernia. Musculoskeletal: Degenerative changes seen of the spine and pelvis. Moderate changes in the lumbar spine. Bridging osteophytes along the right sacroiliac joint. Moderate changes as well of the hips. IMPRESSION: No obstructing renal stone, enhancing renal mass or collecting system filling defect. Slight prominence of the prostate. The bladder wall slightly thickened. Fatty liver infiltration. No bowel obstruction, free air or free fluid. Midline anterior abdominal wall fat containing hernias. Electronically Signed   By: Karen Kays M.D.   On: 04/11/2023 14:53   US SCROTUM W/DOPPLER Result Date: 04/11/2023 CLINICAL DATA:  Bilateral testicular pain for 2 months EXAM: SCROTAL ULTRASOUND DOPPLER ULTRASOUND OF THE TESTICLES TECHNIQUE: Complete ultrasound examination of the testicles, epididymis, and other scrotal structures was performed. Color and spectral Doppler ultrasound were also utilized to evaluate blood flow to the testicles. COMPARISON:  None Available. FINDINGS: Right testicle Measurements: 3.3  x 1.5 x 2.9 cm. No mass or microlithiasis visualized. Left testicle Measurements: 3.4 x 1.6 x 2.8 cm. No mass or microlithiasis visualized. Right epididymis: Right-sided epididymal cyst or spermatocele  measuring 5 mm. Left epididymis: Multiple left-sided epididymal cysts or spermatoceles measuring up to 20 mm. Hydrocele:  Small left hydrocele Varicocele:  None visualized. Pulsed Doppler interrogation of both testes demonstrates normal low resistance arterial and venous waveforms bilaterally. IMPRESSION: Bilateral epididymal cysts or spermatoceles. Focus on the left measures up to 2 cm. Small left hydrocele. Preserved testicular parenchyma and blood flow on Doppler. Electronically Signed   By: Karen Kays M.D.   On: 04/11/2023 14:45   Procedure: cystoscopy.  He was prepped with betadine and 2% lidocaine jelly.  He was given cipro 500mg .  The scope passed easily.  The urethra is normal. The prostate is short with some lateral lobe enlargement but minimal coaptation.  The bladder has mild trabeculation without stones or mucosal lesion.  UO's are normal.    Assessment/Plan: Hypogonadism.  His T is castrate but he ran out of med.     Polycythemia.  His Hgb is down to 16.3.      ED.  He is  partially responding to the combination of daily tadalafil and sildenafil prn but has reduced sensation and delayed ejaculation. His scripts refilled.  II suggested he try Eroxon with the oral meds.  He has failed a VED and with his body habitus he is a poor candidate for injection therapy or probably even an IPP.   I strongly suggested he work on weight loss and exercise.  He is on a GLP med already.   BPH with urgency. Continue  the tadalafil.  Right scrotal pain.  He has a 5 mm right epididymal cyst and multiple large left epididymal cysts with a small hydrocele.   I think the right scrotal pain is most likely referred pain from a musculoskeletal cause.       Meds ordered this encounter  Medications    testosterone cypionate (DEPOTESTOSTERONE CYPIONATE) 200 MG/ML injection    Sig: Inject 0.5 mLs (100 mg total) into the muscle every 7 (seven) days.    Dispense:  10 mL    Refill:  1   tadalafil (CIALIS) 5 MG tablet    Sig: Take 1 tablet (5 mg total) by mouth daily as needed for erectile dysfunction.    Dispense:  30 tablet    Refill:  11   sildenafil (VIAGRA) 100 MG tablet    Sig: Take 1 tablet (100 mg total) by mouth daily as needed.    Dispense:  30 tablet    Refill:  11      Orders Placed This Encounter  Procedures   Urinalysis, Routine w reflex microscopic   Testosterone    Standing Status:   Future    Expected Date:   12/19/2023    Expiration Date:   06/19/2024   Hemoglobin and hematocrit, blood    Standing Status:   Future    Expected Date:   12/19/2023    Expiration Date:   06/19/2024     Return in about 6 months (around 12/19/2023) for with labs. .    CC: Deliah Boston FNP     Bjorn Pippin 06/21/2023 [phone] Patient ID: Jesse Castillo, male   DOB: 1965-07-19, 57 y.o.   MRN: [id number]

## 2023-06-21 DIAGNOSIS — E781 Pure hyperglyceridemia: Secondary | ICD-10-CM | POA: Insufficient documentation

## 2023-06-27 ENCOUNTER — Emergency Department (HOSPITAL_COMMUNITY)
Admission: EM | Admit: 2023-06-27 | Discharge: 2023-06-27 | Disposition: A | Discharge status: Home / Self Care | Payer: 59 | Attending: Emergency Medicine | Admitting: Emergency Medicine

## 2023-06-27 ENCOUNTER — Emergency Department (HOSPITAL_COMMUNITY): Payer: 59

## 2023-06-27 ENCOUNTER — Ambulatory Visit: Payer: Self-pay | Admitting: Internal Medicine

## 2023-06-27 ENCOUNTER — Encounter (HOSPITAL_COMMUNITY): Payer: Self-pay

## 2023-06-27 ENCOUNTER — Other Ambulatory Visit: Payer: Self-pay

## 2023-06-27 DIAGNOSIS — Z7982 Long term (current) use of aspirin: Secondary | ICD-10-CM | POA: Diagnosis not present

## 2023-06-27 DIAGNOSIS — R112 Nausea with vomiting, unspecified: Secondary | ICD-10-CM | POA: Diagnosis not present

## 2023-06-27 DIAGNOSIS — Z1152 Encounter for screening for COVID-19: Secondary | ICD-10-CM | POA: Insufficient documentation

## 2023-06-27 DIAGNOSIS — R739 Hyperglycemia, unspecified: Secondary | ICD-10-CM | POA: Diagnosis not present

## 2023-06-27 DIAGNOSIS — R059 Cough, unspecified: Secondary | ICD-10-CM | POA: Diagnosis not present

## 2023-06-27 DIAGNOSIS — R110 Nausea: Secondary | ICD-10-CM | POA: Diagnosis not present

## 2023-06-27 DIAGNOSIS — E1165 Type 2 diabetes mellitus with hyperglycemia: Secondary | ICD-10-CM | POA: Insufficient documentation

## 2023-06-27 DIAGNOSIS — R918 Other nonspecific abnormal finding of lung field: Secondary | ICD-10-CM | POA: Diagnosis not present

## 2023-06-27 DIAGNOSIS — Z743 Need for continuous supervision: Secondary | ICD-10-CM | POA: Diagnosis not present

## 2023-06-27 DIAGNOSIS — R1111 Vomiting without nausea: Secondary | ICD-10-CM | POA: Diagnosis not present

## 2023-06-27 LAB — URINALYSIS, ROUTINE W REFLEX MICROSCOPIC
Bacteria, UA: NONE SEEN
Bilirubin Urine: NEGATIVE
Glucose, UA: >=500 mg/dL — AB
Hgb urine dipstick: NEGATIVE
Ketones, ur: 5 mg/dL — AB
Leukocytes,Ua: NEGATIVE
Nitrite: NEGATIVE
Protein, ur: 30 mg/dL — AB
Specific Gravity, Urine: >1.046 — ABNORMAL HIGH (ref 1.005–1.030)
pH: 6 (ref 5.0–8.0)

## 2023-06-27 LAB — BASIC METABOLIC PANEL
Anion gap: 12 (ref 5–15)
BUN: 24 mg/dL — ABNORMAL HIGH (ref 6–20)
CO2: 24 mmol/L (ref 22–32)
Calcium: 9.3 mg/dL (ref 8.9–10.3)
Chloride: 101 mmol/L (ref 98–111)
Creatinine, Ser: 1.12 mg/dL (ref 0.61–1.24)
GFR, Estimated: >60 mL/min (ref >60)
Glucose, Bld: 351 mg/dL — ABNORMAL HIGH (ref 70–99)
Potassium: 4.2 mmol/L (ref 3.5–5.1)
Sodium: 137 mmol/L (ref 135–145)

## 2023-06-27 LAB — CBC WITH DIFFERENTIAL/PLATELET
Abs Immature Granulocytes: 0.07 10*3/uL (ref 0.00–0.07)
Basophils Absolute: 0 10*3/uL (ref 0.0–0.1)
Basophils Relative: 0 %
Eosinophils Absolute: 0 10*3/uL (ref 0.0–0.5)
Eosinophils Relative: 0 %
HCT: 48.7 % (ref 39.0–52.0)
Hemoglobin: 17.2 g/dL — ABNORMAL HIGH (ref 13.0–17.0)
Immature Granulocytes: 1 %
Lymphocytes Relative: 4 %
Lymphs Abs: 0.4 10*3/uL — ABNORMAL LOW (ref 0.7–4.0)
MCH: 31.5 pg (ref 26.0–34.0)
MCHC: 35.3 g/dL (ref 30.0–36.0)
MCV: 89.2 fL (ref 80.0–100.0)
Monocytes Absolute: 1 10*3/uL (ref 0.1–1.0)
Monocytes Relative: 10 %
Neutro Abs: 8.6 10*3/uL — ABNORMAL HIGH (ref 1.7–7.7)
Neutrophils Relative %: 85 %
Platelets: 174 10*3/uL (ref 150–400)
RBC: 5.46 MIL/uL (ref 4.22–5.81)
RDW: 14 % (ref 11.5–15.5)
WBC: 10.2 10*3/uL (ref 4.0–10.5)
nRBC: 0 % (ref 0.0–0.2)

## 2023-06-27 LAB — RESP PANEL BY RT-PCR (RSV, FLU A&B, COVID)  RVPGX2
Influenza A by PCR: NEGATIVE
Influenza B by PCR: NEGATIVE
Resp Syncytial Virus by PCR: NEGATIVE
SARS Coronavirus 2 by RT PCR: NEGATIVE

## 2023-06-27 LAB — BLOOD GAS, VENOUS
Acid-Base Excess: 1.2 mmol/L (ref 0.0–2.0)
Bicarbonate: 26 mmol/L (ref 20.0–28.0)
Drawn by: 6509
O2 Saturation: 84.5 %
Patient temperature: 36.9
pCO2, Ven: 41 mm[Hg] — ABNORMAL LOW (ref 44–60)
pH, Ven: 7.41 (ref 7.25–7.43)
pO2, Ven: 51 mm[Hg] — ABNORMAL HIGH (ref 32–45)

## 2023-06-27 LAB — CBG MONITORING, ED
Glucose-Capillary: 331 mg/dL — ABNORMAL HIGH (ref 70–99)
Glucose-Capillary: 304 mg/dL — ABNORMAL HIGH (ref 70–99)
Glucose-Capillary: 266 mg/dL — ABNORMAL HIGH (ref 70–99)

## 2023-06-27 MED ORDER — ONDANSETRON 4 MG PO TBDP
4.0000 mg | ORAL_TABLET | Freq: Once | ORAL | Status: AC
  Administered 2023-06-27: 4 mg via ORAL
  Filled 2023-06-27: qty 1

## 2023-06-27 MED ORDER — ONDANSETRON HCL 4 MG PO TABS: 4.0000 mg | ORAL_TABLET | Freq: Four times a day (QID) | ORAL | 0 refills | Status: AC

## 2023-06-27 MED ORDER — SODIUM CHLORIDE 0.9 % IV BOLUS
1000.0000 mL | Freq: Once | INTRAVENOUS | Status: AC
  Administered 2023-06-27: 1000 mL via INTRAVENOUS

## 2023-06-27 MED ORDER — ONDANSETRON HCL 4 MG/2ML IJ SOLN
4.0000 mg | Freq: Once | INTRAMUSCULAR | Status: AC
  Administered 2023-06-27: 4 mg via INTRAVENOUS
  Filled 2023-06-27: qty 2

## 2023-06-27 MED ORDER — IOHEXOL 300 MG/ML  SOLN
75.0000 mL | Freq: Once | INTRAMUSCULAR | Status: AC | PRN
  Administered 2023-06-27: 75 mL via INTRAVENOUS

## 2023-06-27 NOTE — Telephone Encounter (Addendum)
Copied from CRM [id number]. Topic: Clinical - Red Word Triage >> Jun 27, 2023 12:16 PM Gildardo Pounds wrote: Red Word that prompted transfer to Nurse Triage: Patient has been vomiting and diareah since 3 this morning. his blood sugar is 335. patient is also weak.  No contact, caller hung up before transfer was complete. Attempted to call back but went to voicemail but unable to leave message.

## 2023-06-27 NOTE — ED Notes (Signed)
Pt provided a urinal for a urine sample. Pt states he is unable to urinate at this time.

## 2023-06-27 NOTE — ED Notes (Signed)
Patient transported to CT 

## 2023-06-27 NOTE — ED Notes (Signed)
Pt SPO2 97% while ambulating from room to nurse's station.

## 2023-06-27 NOTE — ED Triage Notes (Signed)
Pt BIB ems for hyperglycemic episode with nausea, vomiting. Per pt no hx of diabetes does not get treatment. Pt states last doctors visit A1C was 6.9 and provider informed him he was not treating it. Pt states all symptoms starting around 0300 this morning.

## 2023-06-27 NOTE — Telephone Encounter (Signed)
Spoke with patient's family and she advised she called an ambulance for the patient.  She stated that within an hour his blood sugar level had gone up to 489.  She advised that he is going to be at [facility] (across from the patient's PCP office)

## 2023-06-27 NOTE — Discharge Instructions (Signed)
Today you were seen for nausea and vomiting.  Please pick up your Zofran and take as needed for nausea and vomiting.  You were also found to be hyperglycemic.  You should follow-up with your primary care physician for further evaluation for possible diabetes.  Thank you for letting us treat you today. After reviewing your labs and imaging, I feel you are safe to go home. Please follow up with your PCP in the next several days and provide them with your records from this visit. Return to the Emergency Room if pain becomes severe or symptoms worsen.

## 2023-06-27 NOTE — ED Notes (Signed)
Pt initial oxygen level 84% RA. Pt placed on 2L of oxygen currently 92% on 2L.

## 2023-06-27 NOTE — ED Provider Notes (Signed)
[place] EMERGENCY DEPARTMENT AT [facility] Provider Note   CSN: [id number] Arrival date & time: 06/27/23  1259     History  Chief Complaint  Patient presents with   Hyperglycemia    Jesse Castillo is a 57 y.o. male presents today for nausea and vomiting since 3 AM.  Patient also endorses chills and weakness.  Patient denies fever, cough, congestion, abdominal pain, dysuria, shortness of breath or chest pain.  Patient also states that his last A1c was around 6.9 but his provider did not start him on diabetes medication.  Patient's blood glucose was found to be 331 by EMS.  Patiently currently sitting at 92% on 2 L nasal cannula.  Patient states that he does not normally wear oxygen at home.   Hyperglycemia Associated symptoms: fatigue, nausea and vomiting        Home Medications Prior to Admission medications   Medication Sig Start Date End Date Taking? Authorizing Provider  ondansetron (ZOFRAN) 4 MG tablet Take 1 tablet (4 mg total) by mouth every 6 (six) hours. 06/27/23  Yes Dolphus Jenny, PA-C  amLODipine (NORVASC) 5 MG tablet TAKE ONE (1) TABLET BY MOUTH EVERY DAY 09/20/22   Gabriel Earing, FNP  aspirin EC 81 MG tablet Take 81 mg by mouth daily.    [provider]  famotidine (PEPCID) 40 MG tablet TAKE ONE TABLET BY MOUTH EVERY NIGHT AT BEDTIME 05/27/23   Billie Lade, MD  fluticasone [facility]) 50 MCG/ACT nasal spray Place 2 sprays into both nostrils daily. 09/04/22   Coralyn Helling, MD  furosemide (LASIX) 80 MG tablet Take 1 tablet (80 mg total) by mouth 2 (two) times daily. 11/09/22 06/20/23  Mallipeddi, Vishnu P, MD  gabapentin (NEURONTIN) 600 MG tablet Take 1 tablet (600 mg total) by mouth 3 (three) times daily. 06/20/23   Billie Lade, MD  HYDROcodone-acetaminophen (NORCO) 5-325 MG tablet Take 1 tablet by mouth every 6 (six) hours as needed for moderate pain. 04/11/23   Carmel Sacramento A, PA-C  icosapent Ethyl (VASCEPA) 1 g capsule Take 2  capsules (2 g total) by mouth 2 (two) times daily. 06/20/23   Mallipeddi, Orion Modest, MD  Misc Natural Products (URINOZINC PLUS PO) Take by mouth.    [provider]  montelukast (SINGULAIR) 10 MG tablet Take 1 tablet (10 mg total) by mouth at bedtime. 09/19/22   Gabriel Earing, FNP  Multiple Vitamins-Minerals (MULTIVITAMIN MEN PO) Take by mouth.    [provider]  NEEDLE, DISP, 23 G (BD DISP NEEDLE) 23G X 1" MISC Use every 14 days with testosterone 03/08/22   Gabriel Earing, FNP  pantoprazole (PROTONIX) 40 MG tablet Take 1 tablet (40 mg total) by mouth 2 (two) times daily. 01/28/23   Gardenia Phlegm, MD  potassium chloride SA (KLOR-CON M) 20 MEQ tablet Take 1 tablet (20 mEq total) by mouth daily. 10/31/22   Mallipeddi, Orion Modest, MD  Probiotic Product (HEALTHY COLON PO) Take by mouth.    [provider]  propranolol ER (INDERAL LA) 60 MG 24 hr capsule Take 1 capsule (60 mg total) by mouth daily. 11/13/22   Huston Foley, MD  rOPINIRole (REQUIP) 4 MG tablet Take 1 tablet (4 mg total) by mouth at bedtime. 09/20/22   Gabriel Earing, FNP  Semaglutide,0.25 or 0.5MG /DOS, (OZEMPIC, 0.25 OR 0.5 MG/DOSE,) 2 MG/3ML SOPN Inject 0.25 mg into the skin once a week. 05/07/23   Billie Lade, MD  sildenafil (VIAGRA) 100  MG tablet Take 1 tablet (100 mg total) by mouth daily as needed. 06/20/23   Bjorn Pippin, MD  Specialty Vitamins Products (ECHINACEA C COMPLETE PO) Take by mouth.    [provider]  Syringe/Needle, Disp, 18G X 1" 3 ML MISC 1 Device by Does not apply route every 14 (fourteen) days. 05/23/22   Gabriel Earing, FNP  tadalafil (CIALIS) 10 MG tablet Take 10 mg by mouth daily as needed for erectile dysfunction.    [provider]  tadalafil (CIALIS) 5 MG tablet Take 1 tablet (5 mg total) by mouth daily as needed for erectile dysfunction. 06/20/23   Bjorn Pippin, MD  testosterone cypionate (DEPOTESTOSTERONE CYPIONATE) 200 MG/ML injection Inject 0.5 mLs (100 mg  total) into the muscle every 7 (seven) days. 06/20/23   Bjorn Pippin, MD  traZODone (DESYREL) 100 MG tablet TAKE ONE TABLET (100MG  TOTAL) BY MOUTH AT BEDTIME 11/05/22   Gabriel Earing, FNP      Allergies    Mobic [meloxicam]    Review of Systems   Review of Systems  Constitutional:  Positive for chills and fatigue.  Gastrointestinal:  Positive for nausea and vomiting.    Physical Exam Updated Vital Signs BP 105/76   Pulse 91   Temp 98.6 F (37 C)   Resp (!) 22   Ht 5\' 11"  (1.803 m)   Wt (!) 159.7 kg   SpO2 91%   BMI 49.09 kg/m  Physical Exam Vitals and nursing note reviewed.  Constitutional:      General: He is not in acute distress.    Appearance: He is well-developed. He is obese.  HENT:     Head: Normocephalic and atraumatic.     Right Ear: External ear normal.     Left Ear: External ear normal.     Nose: Nose normal.  Eyes:     Extraocular Movements: Extraocular movements intact.     Conjunctiva/sclera: Conjunctivae normal.  Cardiovascular:     Rate and Rhythm: Normal rate and regular rhythm.     Pulses: Normal pulses.     Heart sounds: Normal heart sounds. No murmur heard. Pulmonary:     Effort: Pulmonary effort is normal. No respiratory distress.     Breath sounds: Normal breath sounds.  Abdominal:     General: Abdomen is protuberant. Bowel sounds are normal.     Palpations: Abdomen is soft.     Tenderness: There is no abdominal tenderness. There is no right CVA tenderness or left CVA tenderness.     Hernia: A hernia is present. Hernia is present in the umbilical area.     Comments: 2 cm soft, easily reducible umbilical hernia  Musculoskeletal:        General: No swelling.     Cervical back: Normal range of motion and neck supple.  Skin:    General: Skin is warm and dry.     Capillary Refill: Capillary refill takes less than 2 seconds.  Neurological:     General: No focal deficit present.     Mental Status: He is alert.     Motor: No weakness.   Psychiatric:        Mood and Affect: Mood normal.     ED Results / Procedures / Treatments   Labs (all labs ordered are listed, but only abnormal results are displayed) Labs Reviewed  BASIC METABOLIC PANEL - Abnormal; Notable for the following components:      Result Value   Glucose, Bld 351 (*)  BUN 24 (*)    All other components within normal limits  CBC WITH DIFFERENTIAL/PLATELET - Abnormal; Notable for the following components:   Hemoglobin 17.2 (*)    Neutro Abs 8.6 (*)    Lymphs Abs 0.4 (*)    All other components within normal limits  BLOOD GAS, VENOUS - Abnormal; Notable for the following components:   pCO2, Ven 41 (*)    pO2, Ven 51 (*)    All other components within normal limits  URINALYSIS, ROUTINE W REFLEX MICROSCOPIC - Abnormal; Notable for the following components:   Specific Gravity, Urine >1.046 (*)    Glucose, UA >=500 (*)    Ketones, ur 5 (*)    Protein, ur 30 (*)    All other components within normal limits  CBG MONITORING, ED - Abnormal; Notable for the following components:   Glucose-Capillary 331 (*)    All other components within normal limits  CBG MONITORING, ED - Abnormal; Notable for the following components:   Glucose-Capillary 304 (*)    All other components within normal limits  CBG MONITORING, ED - Abnormal; Notable for the following components:   Glucose-Capillary 266 (*)    All other components within normal limits  RESP PANEL BY RT-PCR (RSV, FLU A&B, COVID)  [id number]    EKG None  Radiology CT Chest W Contrast Result Date: 06/27/2023 CLINICAL DATA:  Nausea and vomiting. EXAM: CT CHEST WITH CONTRAST TECHNIQUE: Multidetector CT imaging of the chest was performed during intravenous contrast administration. RADIATION DOSE REDUCTION: This exam was performed according to the departmental dose-optimization program which includes automated exposure control, adjustment of the mA and/or kV according to patient size and/or use of iterative  reconstruction technique. CONTRAST:  75mL OMNIPAQUE IOHEXOL 300 MG/ML  SOLN COMPARISON:  Chest x-ray from same day. CT cardiac dated December 11, 2022. FINDINGS: Cardiovascular: No significant vascular findings. Normal heart size. No pericardial effusion. Mediastinum/Nodes: No enlarged mediastinal, hilar, or axillary lymph nodes. Thyroid gland, trachea, and esophagus demonstrate no significant findings. Lungs/Pleura: No focal consolidation, pleural effusion, or pneumothorax. Normal aeration of the right lower lobe. Upper Abdomen: No acute abnormality. Unchanged severe diffusely decreased liver density. Musculoskeletal: Bilateral gynecomastia. No acute or significant osseous findings. IMPRESSION: 1. No acute intrathoracic process. Normal aeration of the right lower lobe. 2. Unchanged severe hepatic steatosis. Electronically Signed   By: Obie Dredge M.D.   On: 06/27/2023 16:26   DG Chest Port 1 View Result Date: 06/27/2023 CLINICAL DATA:  Cough. EXAM: PORTABLE CHEST 1 VIEW COMPARISON:  July 11, 2022. FINDINGS: The heart size and mediastinal contours are within normal limits. Left lung is clear. There appears to be interval complete atelectasis of right lower lobe. The visualized skeletal structures are unremarkable. IMPRESSION: There appears to be interval complete atelectasis of right lower lobe. CT scan is recommended to evaluate for possible endobronchial obstruction or lesion. Electronically Signed   By: Lupita Raider M.D.   On: 06/27/2023 15:05    Procedures Procedures    Medications Ordered in ED Medications  ondansetron (ZOFRAN-ODT) disintegrating tablet 4 mg (4 mg Oral Given 06/27/23 1412)  sodium chloride 0.9 % bolus 1,000 mL (1,000 mLs Intravenous Bolus 06/27/23 1456)  iohexol (OMNIPAQUE) 300 MG/ML solution 75 mL (75 mLs Intravenous Contrast Given 06/27/23 1557)  ondansetron (ZOFRAN) injection 4 mg (4 mg Intravenous Given 06/27/23 1941)    ED Course/ Medical Decision Making/ A&P  Medical Decision Making Amount and/or Complexity of Data Reviewed Labs: ordered. Radiology: ordered.  Risk Prescription drug management.   This patient presents to the ED with chief complaint(s) of nausea and vomiting with pertinent past medical history of diabetes and morbid obesity which further complicates the presenting complaint. The complaint involves an extensive differential diagnosis and also carries with it a high risk of complications and morbidity.    The differential diagnosis includes hyperglycemia, DKA, GI illness  Additional history obtained: Records reviewed Primary Care Documents  ED Course and Reassessment:   Independent labs interpretation:  The following labs were independently interpreted:  CBC: Mildly evaded hemoglobin which is not uncommon per historical values BMP: Elevated glucose at 351, bun mildly elevated 24 VBG: Mildly decreased PaCO2, mildly elevated PaO2 UA: Greater than 500 glucose, 5 ketones, 30 protein, elevated specific gravity. CBG: 331, 304, 266 Resp panel: Negative  Ambulatory pulse ox: Patient able to ambulate maintaining pulse ox around 97% on room air. P.o. challenge: Patient able to tolerate oral intake without issue  Independent visualization of imaging: - I independently visualized the following imaging with scope of interpretation limited to determining acute life threatening conditions related to emergency care: Chest x-ray, which revealed interval complete atelectasis of the right lower lobe.  CT scan recommended to evaluate for possible endobronchial obstruction or lesion.  CT Chest W con: No acute intrathoracic process.  Normal aeration of the right lower lobe.  Consultation: - Consulted or discussed management/test interpretation w/ external professional: None  Consideration for admission or further workup: Considered for mission or further workup however patient's vital signs, physical exam,  labs, and imaging of all been reassuring.  Patient's nausea and vomiting likely due to GI illness.  Patient tolerating oral intake after Zofran administration in ER.  Patient will be given outpatient course of Zofran for nausea and vomiting.  Patient should follow-up with PCP for further evaluation and workup.  Patient blood sugar was 266 prior to discharge.        Final Clinical Impression(s) / ED Diagnoses Final diagnoses:  Nausea and vomiting, unspecified vomiting type  Hyperglycemia    Rx / DC Orders ED Discharge Orders          Ordered    ondansetron (ZOFRAN) 4 MG tablet  Every 6 hours        06/27/23 1949              Dolphus Jenny, PA-C 06/27/23 1952    Cathren Laine, MD 06/28/23 [phone]

## 2023-06-28 ENCOUNTER — Ambulatory Visit: Payer: Self-pay | Admitting: Internal Medicine

## 2023-06-28 ENCOUNTER — Telehealth: Payer: Self-pay

## 2023-06-28 ENCOUNTER — Inpatient Hospital Stay: Payer: 59 | Admitting: Family Medicine

## 2023-06-28 NOTE — Telephone Encounter (Addendum)
Copied from CRM [id number]. Topic: Clinical - Red Word Triage >> Jun 28, 2023 10:58 AM Elle L wrote: Red Word that prompted transfer to Nurse Triage: The patient went to the hospital yesterday for diarrhea, vomiting, and high blood sugar and was dismissed without a prescription. He needs medical advice regarding his blood sugar. All he has had is unsalted chicken broth in the last 24 hours and his blood sugar was back up to 259.   Chief Complaint: High blood sugar readings Symptoms: Dark urine, "weak from vomiting all day yesterday" Frequency: Intermittent Pertinent Negatives: Patient denies dizziness, further diarrhea or vomiting Disposition: [] ED /[] Urgent Care (no appt availability in office) / [x] Appointment(In office/virtual)/ []  [facility]/ [] Home Care/ [] Refused Recommended Disposition /[] [facility]/ [x]  Follow-up with PCP Additional Notes: Wife reporting that pt's blood sugar levels are uncontrolled at present, last blood sugar at 10 am this morning was 259 after only "1/2 coffee cup of unsalted chicken broth, no solid food whatsoever" since episodes of vomiting/diarrhea. Wife reporting that pt was seen in ED last night for his symptomatic hyperglycemia, wife reporting that pt had been experiencing dizziness and sweating with higher blood sugar readings, had been vomiting with diarrhea. Wife reporting that at ED, pt was "still vomiting last night," ED staff "gave some 7up, gave something for vomiting," and that ED said to get him seen asap with PCP for blood sugars. Wife reporting that she checked his blood sugar again at 1:30 am this morning because pt was stating he "felt a little bit off," blood sugar was 149, so wife reporting they "thought coming down and doing okay," pt had "some diarrhea this morning," but no further then "had a firm stool and no vomiting." Wife confirms pt drinking "bunch of water," also had "1/2 coffee cup of unsalted chicken broth," then checked  blood sugar at 10 am for reading of 259. Wife is concerned because pt has had "no solid food whatsoever because vomiting." Wife is concerned that pt was not given any med for blood sugars and that appt with PCP is not until 07/04/23. Nurse added appt with Dr. Durwin Nora to waitlist. Pt been had not been taking any meds for diabetes to this point, wife is requesting that pt receive some sort of med to ensure pt blood sugars are managed at this time to better ensure no diabetic crisis. Wife reporting that pt currently not experiencing any dizziness or sweating. Wife reporting that pt is "weak from vomiting all day yesterday, urine is dark yellow," not urinated in the "last 3 hours" but confirms pt been urinating every 8 hours to more than droplets and confirms he had his first "firm stool" this morning. Wife confirms no further vomiting or diarrhea since last night. Wife confirms pt "was able to get up and take a shower this morning," confirms no dizziness. Wife wanting pt to be seen with other doctor, verbalized understanding that he should follow up with PCP but wife stating that "diabetes is not something to mess with," wife very concerned. Agreed with wife for pt to be examined in office within next couple days to ensure currently unsymptomatic pt doing okay in recovery after GI illness and symptomatic hyperglycemia, advised pt follow up with PCP as well. Educated wife that stress in body, such as vomiting and diarrhea, can cause pancreas to not handle blood sugar as well, leading to those higher blood sugar readings, wife verbalized understanding. Scheduled appt with [facility] today to ensure recovering  well and for peace of mind for pt and wife. Wife requesting that if any contact from doc/nurse, call at [phone] (home phone), not [phone] (mobile). Gave wife the office location/contact info. Gave wife contact info for Patient Experience to provide her feedback. Wife verbalized understanding  and appreciation.  Reason for Disposition  [1] Blood glucose 240 - 300 mg/dL (62.1 - 30.8 mmol/L) AND [2] does not  use insulin (e.g., not insulin-dependent; most people with type 2 diabetes)  Answer Assessment - Initial Assessment Questions 1. BLOOD GLUCOSE: "What is your blood glucose level?"      Wife reporting that pt's blood sugar levels are uncontrolled at present, last blood sugar at 10 am this morning was 259 after only "1/2 coffee cup of unsalted chicken broth, no solid food whatsoever" since episodes of vomiting/diarrhea. Wife reporting that pt was seen in ED last night for his symptomatic hyperglycemia, wife reporting that pt had been experiencing dizziness and sweating with higher blood sugar readings, had been vomiting with diarrhea. 7. DIABETES PILLS: "Do you take any pills for your diabetes?" If Yes, ask: "Have you missed taking any pills recently?"     Pt been had not been taking any meds for diabetes to this point, wife is requesting that pt receive some sort of med to ensure pt blood sugars are managed at this time to better ensure no diabetic crisis. 8. OTHER SYMPTOMS: "Do you have any symptoms?" (e.g., fever, frequent urination, difficulty breathing, dizziness, weakness, vomiting)     Wife reporting that pt currently not experiencing any dizziness or sweating. Wife reporting that pt is "weak from vomiting all day yesterday, urine is dark yellow," not urinated in the "last 3 hours" but confirms pt been urinating every 8 hours to more than droplets and confirms he had his first "firm stool" this morning. Wife confirms no further vomiting or diarrhea since last night. Wife confirms pt "was able to get up and take a shower this morning," confirms no dizziness.  Protocols used: Diabetes - High Blood Sugar-A-AH

## 2023-06-28 NOTE — Transitions of Care (Post Inpatient/ED Visit) (Unsigned)
   06/28/2023  Name: Jesse Castillo MRN: [id number] DOB: January 13, 1966  Today's TOC FU Call Status: Today's TOC FU Call Status:: Unsuccessful Call (1st Attempt) Unsuccessful Call (1st Attempt) Date: 06/28/23  Attempted to reach the patient regarding the most recent Inpatient/ED visit.  Follow Up Plan: Additional outreach attempts will be made to reach the patient to complete the Transitions of Care (Post Inpatient/ED visit) call.    Elsa Ploch, CMA  [facility] Team Direct Dial: [phone]

## 2023-07-01 NOTE — Transitions of Care (Post Inpatient/ED Visit) (Unsigned)
   07/01/2023  Name: Jesse Castillo MRN: [id number] DOB: 04-Mar-1966  Today's TOC FU Call Status: Today's TOC FU Call Status:: Unsuccessful Call (2nd Attempt) Unsuccessful Call (1st Attempt) Date: 06/28/23 Unsuccessful Call (2nd Attempt) Date: 07/01/23  Attempted to reach the patient regarding the most recent Inpatient/ED visit.  Follow Up Plan: Additional outreach attempts will be made to reach the patient to complete the Transitions of Care (Post Inpatient/ED visit) call.    Makenah Karas, CMA  [facility] Team Direct Dial: [phone]

## 2023-07-04 ENCOUNTER — Ambulatory Visit: Payer: 59 | Admitting: Internal Medicine

## 2023-07-04 VITALS — BP 120/73 | HR 77 | Ht 71.0 in | Wt 345.0 lb

## 2023-07-04 DIAGNOSIS — E119 Type 2 diabetes mellitus without complications: Secondary | ICD-10-CM

## 2023-07-04 DIAGNOSIS — Z7985 Long-term (current) use of injectable non-insulin antidiabetic drugs: Secondary | ICD-10-CM

## 2023-07-04 DIAGNOSIS — E1165 Type 2 diabetes mellitus with hyperglycemia: Secondary | ICD-10-CM

## 2023-07-04 DIAGNOSIS — E1169 Type 2 diabetes mellitus with other specified complication: Secondary | ICD-10-CM | POA: Diagnosis not present

## 2023-07-04 MED ORDER — METFORMIN HCL ER 500 MG PO TB24: 500.0000 mg | ORAL_TABLET | Freq: Two times a day (BID) | ORAL | 2 refills | Status: DC

## 2023-07-04 MED ORDER — TRULICITY 0.75 MG/0.5ML ~~LOC~~ SOAJ: 0.7500 mg | SUBCUTANEOUS | 0 refills | Status: AC

## 2023-07-04 NOTE — Progress Notes (Signed)
 Acute Office Visit  Subjective:     Patient ID: DONTAVION NOXON, male    DOB: March 08, 1966, 58 y.o.   MRN: [id number]  Chief Complaint  Patient presents with   Diabetes    Patient is here for DM f/u. Was in the ED on 12/26 for hyperglycemia.    Mr. Gribble returns to care today for an acute visit in the setting of diabetes mellitus with hyperglycemia.  Last evaluated by me in November 2024.  Ozempic  was prescribed in the setting of diabetes, which was newly diagnosed in July 2024.  Meeting criteria with A1c 6.5 at that time.  Mr. Suddreth presented to the emergency department on 06/27/23 endorsing nausea with vomiting and hyperglycemia.  BGL 331 upon EMS arrival.  Glucosuria noted but labs were otherwise reassuring.  PCP follow-up recommended for diabetes management.  BGL 266 prior to discharge.  Today Mr. Kronk notes that his blood sugar has ranged 180-240.  He denies polyuria/polydipsia and polyphagia.  He does not have any additional concerns to discuss.  Review of Systems  Constitutional:  Negative for chills and fever.  HENT:  Negative for sore throat.   Respiratory:  Negative for cough and shortness of breath.   Cardiovascular:  Negative for chest pain, palpitations and leg swelling.  Gastrointestinal:  Negative for abdominal pain, blood in stool, constipation, diarrhea, nausea and vomiting.  Genitourinary:  Negative for dysuria and hematuria.  Musculoskeletal:  Negative for myalgias.  Skin:  Negative for itching and rash.  Neurological:  Negative for dizziness and headaches.  Psychiatric/Behavioral:  Negative for depression and suicidal ideas.       Objective:    BP 120/73   Pulse 77   Ht 5' 11 (1.803 m)   Wt (!) 345 lb (156.5 kg)   SpO2 96%   BMI 48.12 kg/m   Physical Exam Vitals reviewed.  Constitutional:      General: He is not in acute distress.    Appearance: Normal appearance. He is obese. He is not ill-appearing.  HENT:     Head: Normocephalic and atraumatic.      Right Ear: External ear normal.     Left Ear: External ear normal.     Nose: Nose normal. No congestion or rhinorrhea.     Mouth/Throat:     Mouth: Mucous membranes are moist.     Pharynx: Oropharynx is clear.  Eyes:     General: No scleral icterus.    Extraocular Movements: Extraocular movements intact.     Conjunctiva/sclera: Conjunctivae normal.     Pupils: Pupils are equal, round, and reactive to light.  Cardiovascular:     Rate and Rhythm: Normal rate and regular rhythm.     Pulses: Normal pulses.     Heart sounds: Normal heart sounds. No murmur heard. Pulmonary:     Effort: Pulmonary effort is normal.     Breath sounds: Normal breath sounds. No wheezing, rhonchi or rales.  Abdominal:     General: Abdomen is flat. Bowel sounds are normal. There is no distension.     Palpations: Abdomen is soft.     Tenderness: There is no abdominal tenderness.  Musculoskeletal:        General: No swelling or deformity. Normal range of motion.     Cervical back: Normal range of motion.  Skin:    General: Skin is warm and dry.     Capillary Refill: Capillary refill takes less than 2 seconds.  Neurological:     General:  No focal deficit present.     Mental Status: He is alert and oriented to person, place, and time.     Motor: No weakness.  Psychiatric:        Mood and Affect: Mood normal.        Behavior: Behavior normal.        Thought Content: Thought content normal.       Assessment & Plan:   Problem List Items Addressed This Visit       Type 2 diabetes mellitus with hyperglycemia (HCC) - Primary   Presenting today for an acute visit in the setting of diabetes mellitus with hyperglycemia.  Recent ED presentation in late December for hyperglycemia.  Labs were otherwise reassuring.  His blood sugar has most recently ranged 180-240.  We previously attempted to start Ozempic  but it was not covered by insurance.  He is not taking any medications currently for management of  diabetes. -Repeat A1c ordered today -Start metformin  XR 500 mg twice daily -Will also start Trulicity  0.75 mg weekly. -He will return to care for previously scheduled follow-up in early February.       Meds ordered this encounter  Medications   Dulaglutide  (TRULICITY ) 0.75 MG/0.5ML SOAJ    Sig: Inject 0.75 mg into the skin once a week for 28 days.    Dispense:  2 mL    Refill:  0   metFORMIN  (GLUCOPHAGE -XR) 500 MG 24 hr tablet    Sig: Take 1 tablet (500 mg total) by mouth 2 (two) times daily with a meal.    Dispense:  60 tablet    Refill:  2    Return if symptoms worsen or fail to improve.  Manus FORBES Fireman, MD

## 2023-07-04 NOTE — Patient Instructions (Signed)
 It was a pleasure to see you today.  Thank you for giving us  the opportunity to be involved in your care.  Below is a brief recap of your visit and next steps.  We will plan to see you again in February.  Summary Start Trulicity  0.75 mg weekly Add metformin  XR 500 mg twice daily Follow up as scheduled in early February

## 2023-07-06 LAB — BAYER DCA HB A1C WAIVED: HB A1C (BAYER DCA - WAIVED): 8.7 % — ABNORMAL HIGH (ref 4.8–5.6)

## 2023-07-07 ENCOUNTER — Encounter: Payer: Self-pay | Admitting: Internal Medicine

## 2023-07-08 ENCOUNTER — Other Ambulatory Visit: Payer: Self-pay | Admitting: Neurology

## 2023-07-08 ENCOUNTER — Other Ambulatory Visit: Payer: Self-pay | Admitting: Internal Medicine

## 2023-08-07 ENCOUNTER — Encounter: Payer: Self-pay | Admitting: Internal Medicine

## 2023-08-07 ENCOUNTER — Ambulatory Visit: Payer: 59 | Admitting: Internal Medicine

## 2023-08-07 VITALS — BP 147/79 | HR 81 | Ht 71.0 in | Wt 350.0 lb

## 2023-08-07 DIAGNOSIS — G4701 Insomnia due to medical condition: Secondary | ICD-10-CM | POA: Diagnosis not present

## 2023-08-07 DIAGNOSIS — F411 Generalized anxiety disorder: Secondary | ICD-10-CM

## 2023-08-07 DIAGNOSIS — E1165 Type 2 diabetes mellitus with hyperglycemia: Secondary | ICD-10-CM | POA: Diagnosis not present

## 2023-08-07 DIAGNOSIS — E119 Type 2 diabetes mellitus without complications: Secondary | ICD-10-CM

## 2023-08-07 DIAGNOSIS — Z7984 Long term (current) use of oral hypoglycemic drugs: Secondary | ICD-10-CM | POA: Diagnosis not present

## 2023-08-07 MED ORDER — PEN NEEDLES 32G X 4 MM MISC: 1.0000 | Freq: Every day | 3 refills | Status: AC

## 2023-08-07 MED ORDER — METFORMIN HCL ER 500 MG PO TB24: 1000.0000 mg | ORAL_TABLET | Freq: Two times a day (BID) | ORAL | 2 refills | Status: DC

## 2023-08-07 MED ORDER — LIRAGLUTIDE 18 MG/3ML ~~LOC~~ SOPN: PEN_INJECTOR | SUBCUTANEOUS | 0 refills | Status: DC

## 2023-08-07 MED ORDER — TRAZODONE HCL 150 MG PO TABS: 150.0000 mg | ORAL_TABLET | Freq: Every day | ORAL | 1 refills | Status: DC

## 2023-08-07 MED ORDER — HYDROXYZINE PAMOATE 25 MG PO CAPS: 25.0000 mg | ORAL_CAPSULE | Freq: Three times a day (TID) | ORAL | 0 refills | Status: AC | PRN

## 2023-08-07 NOTE — Assessment & Plan Note (Signed)
 Currently prescribed trazodone  100 mg nightly.  He describes jitteriness and difficulty falling asleep multiple nights recently.  I suspect that this is at least partially due to hyperglycemia. -Addressing diabetes control as otherwise documented -Increase trazodone  to 150 mg nightly -Add hydroxyzine  for as needed anxiety/insomnia relief

## 2023-08-07 NOTE — Progress Notes (Signed)
 Established Patient Office Visit  Subjective   Patient ID: Jesse Castillo, male    DOB: [birth year]  Age: 58 y.o. MRN: [id number]  Chief Complaint  Patient presents with   Diabetes    Follow up    Jesse Castillo returns to care today for routine follow-up.  He was last evaluated by me on 1/2 for an acute visit in the setting of diabetes with hyperglycemia after presenting to the emergency department in late December.  Metformin  XR 500 mg twice daily and Trulicity  0.75 mg weekly were started.  There have been no acute interval events.  Previously seen by me for routine follow-up in November 2024.  Jesse Castillo reports feeling fairly well today.  Unfortunately, he was not able to start Trulicity  due to issues with insurance coverage.  He has been taking metformin  XR as recently prescribed and has not experienced any adverse side effects.  Jesse Castillo called his insurance company earlier today to discuss coverage for diabetes related medications.  He was told that liraglutide  is covered and he is interested in starting this for improved treatment of diabetes.  He has been checking his blood sugar regularly and has multiple readings exceeding 300.  His wife is understandably concerned about his poorly controlled diabetes.  Jesse Castillo additional concern today is insomnia.  He is currently prescribed trazodone  100 mg nightly, but reports multiple recent episodes of jitteriness when it is time to fall asleep.  Past Medical History:  Diagnosis Date   Enlarged prostate    GERD (gastroesophageal reflux disease)    Hypertension    Neuropathy    RLE   Pneumonia due to COVID-19 virus 07/2020   Restless leg    Sleep apnea    Tremor of left hand    Past Surgical History:  Procedure Laterality Date   HERNIA REPAIR     Social History   Tobacco Use   Smoking status: Former    Current packs/day: 0.00    Average packs/day: 3.0 packs/day for 18.0 years (54.0 ttl pk-yrs)    Types: Cigarettes    Start date:  07/02/1989    Quit date: 07/03/2007    Years since quitting: 16.1   Smokeless tobacco: Never  Vaping Use   Vaping status: Never Used  Substance Use Topics   Alcohol use: Never   Drug use: Never   Family History  Problem Relation Age of Onset   Heart disease Mother    Tremor Father    Hypertension Daughter    Heart disease Maternal Grandmother    Parkinson's disease Paternal Grandmother    Allergies  Allergen Reactions   Mobic  [Meloxicam ] Other (See Comments)    Mouth sores   Review of Systems  Psychiatric/Behavioral:  The patient has insomnia.      Objective:     BP (!) 147/79 (BP Location: Left Arm, Patient Position: Sitting, Cuff Size: Large)   Pulse 81   Ht 5' 11 (1.803 m)   Wt (!) 350 lb (158.8 kg)   SpO2 93%   BMI 48.82 kg/m  BP Readings from Last 3 Encounters:  08/07/23 (!) 147/79  07/04/23 120/73  06/27/23 (!) 133/91   Physical Exam Vitals reviewed.  Constitutional:      General: He is not in acute distress.    Appearance: Normal appearance. He is obese. He is not ill-appearing.  HENT:     Head: Normocephalic and atraumatic.     Right Ear: External ear normal.     Left  Ear: External ear normal.     Nose: Nose normal. No congestion or rhinorrhea.     Mouth/Throat:     Mouth: Mucous membranes are moist.     Pharynx: Oropharynx is clear.  Eyes:     General: No scleral icterus.    Extraocular Movements: Extraocular movements intact.     Conjunctiva/sclera: Conjunctivae normal.     Pupils: Pupils are equal, round, and reactive to light.  Cardiovascular:     Rate and Rhythm: Normal rate and regular rhythm.     Pulses: Normal pulses.     Heart sounds: Normal heart sounds. No murmur heard. Pulmonary:     Effort: Pulmonary effort is normal.     Breath sounds: Normal breath sounds. No wheezing, rhonchi or rales.  Abdominal:     General: Abdomen is flat. Bowel sounds are normal. There is no distension.     Palpations: Abdomen is soft.     Tenderness:  There is no abdominal tenderness.  Musculoskeletal:        General: No swelling or deformity. Normal range of motion.     Cervical back: Normal range of motion.  Skin:    General: Skin is warm and dry.     Capillary Refill: Capillary refill takes less than 2 seconds.  Neurological:     General: No focal deficit present.     Mental Status: He is alert and oriented to person, place, and time.     Motor: No weakness.  Psychiatric:        Mood and Affect: Mood normal.        Behavior: Behavior normal.        Thought Content: Thought content normal.   Last CBC Lab Results  Component Value Date   WBC 10.2 06/27/2023   HGB 17.2 (H) 06/27/2023   HCT 48.7 06/27/2023   MCV 89.2 06/27/2023   MCH 31.5 06/27/2023   RDW 14.0 06/27/2023   PLT 174 06/27/2023   Last metabolic panel Lab Results  Component Value Date   GLUCOSE 351 (H) 06/27/2023   NA 137 06/27/2023   K 4.2 06/27/2023   CL 101 06/27/2023   CO2 24 06/27/2023   BUN 24 (H) 06/27/2023   CREATININE 1.12 06/27/2023   GFRNONAA >60 06/27/2023   CALCIUM 9.3 06/27/2023   PROT 6.7 01/28/2023   ALBUMIN 4.2 01/28/2023   LABGLOB 2.5 01/28/2023   AGRATIO 1.8 03/07/2022   BILITOT 0.4 01/28/2023   ALKPHOS 103 01/28/2023   AST 49 (H) 01/28/2023   ALT 58 (H) 01/28/2023   ANIONGAP 12 06/27/2023   Last lipids Lab Results  Component Value Date   CHOL 173 01/28/2023   HDL 25 (L) 01/28/2023   LDLCALC 67 01/28/2023   TRIG 526 (H) 01/28/2023   CHOLHDL 6.9 (H) 01/28/2023   Last hemoglobin A1c Lab Results  Component Value Date   HGBA1C 8.7 (H) 07/04/2023   Last thyroid  functions Lab Results  Component Value Date   TSH 3.130 05/07/2023   Last vitamin B12 and Folate Lab Results  Component Value Date   VITAMINB12 556 03/07/2022   FOLATE >20.0 03/07/2022   The 10-year ASCVD risk score (Arnett DK, et al., 2019) is: 23.2%    Assessment & Plan:   Problem List Items Addressed This Visit       Type 2 diabetes mellitus with  hyperglycemia (HCC)   A1c 8.7 on labs from last month.  Metformin  XR 500 mg twice daily was started.  Trulicity  ordered as  well but not started due to issues with insurance coverage.  He has been checking his blood sugar regularly and has multiple readings exceeding 300.  Readings are consistently above 200.  His wife is understandably concerned about his blood sugar. -Increase metformin  XR to 1000 mg twice daily -Start liraglutide  0.6 mg daily and increase to 1.2 mg daily after 1 week.  Pen needles prescribed as well. -Mr. Bartko endorses poor dieting habits.  He has previously met with medical nutrition therapy but reports that it is not realistic for him to strictly follow a healthy diet.  He reports drinking soda and eating ice cream and cake regularly.  He is aware that this is negatively contributing to diabetes control. We discussed starting with small, practical changes to his diet over time.  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  He describes jitteriness and difficulty falling asleep multiple nights recently.  I suspect that this is at least partially due to hyperglycemia. -Addressing diabetes control as otherwise documented -Increase trazodone  to 150 mg nightly -Add hydroxyzine  for as needed anxiety/insomnia relief      Return in about 4 weeks (around 09/04/2023) for DM.   Manus FORBES Fireman, MD

## 2023-08-07 NOTE — Assessment & Plan Note (Signed)
 Presenting today for an acute visit in the setting of diabetes mellitus with hyperglycemia.  Recent ED presentation in late December for hyperglycemia.  Labs were otherwise reassuring.  His blood sugar has most recently ranged 180-240.  We previously attempted to start Ozempic  but it was not covered by insurance.  He is not taking any medications currently for management of diabetes. -Repeat A1c ordered today -Start metformin  XR 500 mg twice daily -Will also start Trulicity  0.75 mg weekly. -He will return to care for previously scheduled follow-up in early February.

## 2023-08-07 NOTE — Assessment & Plan Note (Signed)
 A1c 8.7 on labs from last month.  Metformin  XR 500 mg twice daily was started.  Trulicity  ordered as well but not started due to issues with insurance coverage.  He has been checking his blood sugar regularly and has multiple readings exceeding 300.  Readings are consistently above 200.  His wife is understandably concerned about his blood sugar. -Increase metformin  XR to 1000 mg twice daily -Start liraglutide  0.6 mg daily and increase to 1.2 mg daily after 1 week.  Pen needles prescribed as well. -Jesse Castillo endorses poor dieting habits.  He has previously met with medical nutrition therapy but reports that it is not realistic for him to strictly follow a healthy diet.  He reports drinking soda and eating ice cream and cake regularly.  He is aware that this is negatively contributing to diabetes control. We discussed starting with small, practical changes to his diet over time.  -He will continue to check his blood sugar regularly -Follow-up in 4 weeks for diabetes management

## 2023-08-07 NOTE — Patient Instructions (Signed)
 It was a pleasure to see you today.  Thank you for giving us  the opportunity to be involved in your care.  Below is a brief recap of your visit and next steps.  We will plan to see you again in March.  Summary Increase metformin  to 1000 mg twice daily Start liraglutide  Increase trazodone  to 150 mg nightly Add hydroxyzine  for as need sleep aid Follow up in March

## 2023-08-24 ENCOUNTER — Other Ambulatory Visit: Payer: Self-pay | Admitting: Internal Medicine

## 2023-08-24 DIAGNOSIS — K219 Gastro-esophageal reflux disease without esophagitis: Secondary | ICD-10-CM

## 2023-09-01 ENCOUNTER — Ambulatory Visit
Admission: EM | Admit: 2023-09-01 | Discharge: 2023-09-01 | Disposition: A | Discharge status: Home / Self Care | Attending: Nurse Practitioner | Admitting: Nurse Practitioner

## 2023-09-01 ENCOUNTER — Encounter: Payer: Self-pay | Admitting: Emergency Medicine

## 2023-09-01 DIAGNOSIS — M545 Low back pain, unspecified: Secondary | ICD-10-CM

## 2023-09-01 DIAGNOSIS — G8929 Other chronic pain: Secondary | ICD-10-CM | POA: Diagnosis not present

## 2023-09-01 DIAGNOSIS — M25551 Pain in right hip: Secondary | ICD-10-CM

## 2023-09-01 MED ORDER — KETOROLAC TROMETHAMINE 30 MG/ML IJ SOLN
30.0000 mg | Freq: Once | INTRAMUSCULAR | Status: AC
  Administered 2023-09-01: 30 mg via INTRAMUSCULAR

## 2023-09-01 NOTE — ED Provider Notes (Signed)
 [facility]    CSN: [id number] Arrival date & time: 09/01/23  0802      History   Chief Complaint No chief complaint on file.   HPI Jesse Castillo is a 58 y.o. male.   The history is provided by the patient.   Patient presents for complaints of low back pain and right hip pain.  Patient states this has been a chronic condition.  States over the past 2 days, pain has worsened.  States that he has not experienced any injury or trauma.  States that he noticed it after he was working outside 1 day ago.  Patient denies numbness, tingling, radiation of pain, bruising, swelling, redness, loss of bowel or bladder function, or inability to ambulate.  Patient states that he has pain when he is sitting down and when he tries to get up.  States that he has been seen in the past for this, states x-rays have also been done, he is currently taking diclofenac 75 mg daily for symptoms. 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   Congestive heart failure (CHF) (HCC) 10/31/2022   Erythrocytosis 09/19/2022   Mixed hyperlipidemia 09/19/2022   Generalized anxiety disorder 03/07/2022   Depression, recurrent (HCC) 03/07/2022   Insomnia due to medical condition 03/07/2022   Essential tremor 04/24/2021   Seasonal allergies 04/24/2021   Umbilical hernia without obstruction and without gangrene 04/24/2021   Erectile dysfunction 08/04/2020   Gastroesophageal reflux disease 12/21/2019   Essential  hypertension 09/20/2019   Arthritis 09/20/2019   Morbid obesity (HCC) 09/20/2019   Low testosterone in male 09/18/2019   Enlarged prostate    Neuropathy    Restless leg    OSA on CPAP     Past Surgical History:  Procedure Laterality Date   HERNIA REPAIR         Home Medications    Prior to Admission medications   Medication Sig Start Date End Date Taking? Authorizing Provider  amLODipine (NORVASC) 5 MG tablet TAKE ONE (1) TABLET BY MOUTH EVERY DAY 09/20/22   Gabriel Earing, FNP  aspirin EC 81 MG tablet Take 81 mg by mouth daily.    [provider]  diclofenac (VOLTAREN) 75 MG EC tablet TAKE ONE TABLET (75MG  TOTAL) BY MOUTH TWO TIMES DAILY 07/08/23   Billie Lade, MD  famotidine (PEPCID) 40 MG tablet TAKE ONE TABLET BY MOUTH EVERY NIGHT AT BEDTIME 08/26/23   Billie Lade, MD  fluticasone (FLONASE) 50 MCG/ACT nasal spray Place 2 sprays into both nostrils daily. 09/04/22   Coralyn Helling, MD  furosemide (LASIX) 80 MG tablet Take 1 tablet (80 mg total) by mouth 2 (two) times daily. 11/09/22 06/20/23  Mallipeddi, Vishnu P, MD  gabapentin (NEURONTIN) 600 MG tablet Take 1 tablet (600 mg total) by mouth 3 (three) times daily. 06/20/23   Billie Lade, MD  HYDROcodone-acetaminophen (NORCO) 5-325 MG tablet Take 1 tablet by mouth every  6 (six) hours as needed for moderate pain. 04/11/23   Carmel Sacramento A, PA-C  hydrOXYzine (VISTARIL) 25 MG capsule Take 1 capsule (25 mg total) by mouth every 8 (eight) hours as needed. 08/07/23   Billie Lade, MD  icosapent Ethyl (VASCEPA) 1 g capsule Take 2 capsules (2 g total) by mouth 2 (two) times daily. 06/20/23   Mallipeddi, Vishnu P, MD  Insulin Pen Needle (PEN NEEDLES) 32G X 4 MM MISC 1 each by Does not apply route daily. 08/07/23   Billie Lade, MD  liraglutide (VICTOZA) 18 MG/3ML SOPN 0.6 mg daily for 7 days, then increase to 1.2 mg daily 08/07/23   Billie Lade, MD  metFORMIN (GLUCOPHAGE-XR) 500 MG 24 hr tablet Take 2 tablets  (1,000 mg total) by mouth 2 (two) times daily with a meal. 08/07/23 11/05/23  Billie Lade, MD  Misc Natural Products (URINOZINC PLUS PO) Take by mouth.    [provider]  montelukast (SINGULAIR) 10 MG tablet Take 1 tablet (10 mg total) by mouth at bedtime. 09/19/22   Gabriel Earing, FNP  Multiple Vitamins-Minerals (MULTIVITAMIN MEN PO) Take by mouth.    [provider]  NEEDLE, DISP, 23 G (BD DISP NEEDLE) 23G X 1" MISC Use every 14 days with testosterone 03/08/22   Gabriel Earing, FNP  ondansetron (ZOFRAN) 4 MG tablet Take 1 tablet (4 mg total) by mouth every 6 (six) hours. 06/27/23   Dolphus Jenny, PA-C  pantoprazole (PROTONIX) 40 MG tablet Take 1 tablet (40 mg total) by mouth 2 (two) times daily. 01/28/23   Gardenia Phlegm, MD  potassium chloride SA (KLOR-CON M) 20 MEQ tablet Take 1 tablet (20 mEq total) by mouth daily. 10/31/22   Mallipeddi, Orion Modest, MD  Probiotic Product (HEALTHY COLON PO) Take by mouth.    [provider]  propranolol ER (INDERAL LA) 60 MG 24 hr capsule TAKE ONE CAPSULE (60MG  TOTAL) BY MOUTH DAILY 07/08/23   [name], NP  rOPINIRole (REQUIP) 4 MG tablet Take 1 tablet (4 mg total) by mouth at bedtime. 09/20/22   Gabriel Earing, FNP  sildenafil (VIAGRA) 100 MG tablet Take 1 tablet (100 mg total) by mouth daily as needed. 06/20/23   Bjorn Pippin, MD  Specialty Vitamins Products (ECHINACEA C COMPLETE PO) Take by mouth.    [provider]  Syringe/Needle, Disp, 18G X 1" 3 ML MISC 1 Device by Does not apply route every 14 (fourteen) days. 05/23/22   Gabriel Earing, FNP  tadalafil (CIALIS) 10 MG tablet Take 10 mg by mouth daily as needed for erectile dysfunction.    [provider]  tadalafil (CIALIS) 5 MG tablet Take 1 tablet (5 mg total) by mouth daily as needed for erectile dysfunction. 06/20/23   Bjorn Pippin, MD  testosterone cypionate (DEPOTESTOSTERONE CYPIONATE) 200 MG/ML injection Inject 0.5 mLs (100 mg total) into the  muscle every 7 (seven) days. 06/20/23   Bjorn Pippin, MD  traZODone (DESYREL) 150 MG tablet Take 1 tablet (150 mg total) by mouth at bedtime. 08/07/23   Billie Lade, MD    Family History Family History  Problem Relation Age of Onset   Heart disease Mother    Tremor Father    Hypertension Daughter    Heart disease Maternal Grandmother    Parkinson's disease Paternal Grandmother     Social History Social History   Tobacco Use   Smoking status: Former    Current packs/day: 0.00  Average packs/day: 3.0 packs/day for 18.0 years (54.0 ttl pk-yrs)    Types: Cigarettes    Start date: 07/02/1989    Quit date: 07/03/2007    Years since quitting: 16.1   Smokeless tobacco: Never  Vaping Use   Vaping status: Never Used  Substance Use Topics   Alcohol use: Never   Drug use: Never     Allergies   Mobic [meloxicam]   Review of Systems Review of Systems Per HPI  Physical Exam Triage Vital Signs ED Triage Vitals  Encounter Vitals Group     BP 09/01/23 0826 120/72     Systolic BP Percentile --      Diastolic BP Percentile --      Pulse Rate 09/01/23 0826 78     Resp 09/01/23 0826 20     Temp 09/01/23 0826 (!) 97.5 F (36.4 C)     Temp Source 09/01/23 0826 Oral     SpO2 09/01/23 0826 96 %     Weight --      Height --      Head Circumference --      Peak Flow --      Pain Score 09/01/23 0827 8     Pain Loc --      Pain Education --      Exclude from Growth Chart --    No data found.  Updated Vital Signs BP 120/72 (BP Location: Right Arm)   Pulse 78   Temp (!) 97.5 F (36.4 C) (Oral)   Resp 20   SpO2 96%   Visual Acuity Right Eye Distance:   Left Eye Distance:   Bilateral Distance:    Right Eye Near:   Left Eye Near:    Bilateral Near:     Physical Exam Vitals and nursing note reviewed.  Constitutional:      General: He is not in acute distress.    Appearance: Normal appearance.  HENT:     Head: Normocephalic.  Eyes:     Extraocular Movements:  Extraocular movements intact.     Conjunctiva/sclera: Conjunctivae normal.     Pupils: Pupils are equal, round, and reactive to light.  Cardiovascular:     Rate and Rhythm: Normal rate and regular rhythm.     Pulses: Normal pulses.     Heart sounds: Normal heart sounds.  Pulmonary:     Effort: Pulmonary effort is normal. No respiratory distress.     Breath sounds: Normal breath sounds. No stridor. No wheezing, rhonchi or rales.  Abdominal:     General: Bowel sounds are normal.     Palpations: Abdomen is soft.     Tenderness: There is no abdominal tenderness.  Musculoskeletal:     Cervical back: Normal range of motion.     Lumbar back: Tenderness (midline lumbar spine at L2-L4) present. No swelling, deformity, signs of trauma or spasms. Decreased range of motion. Negative right straight leg raise test.       Back:     Right hip: Tenderness present. No deformity. Decreased range of motion. Decreased strength.       Legs:  Skin:    General: Skin is warm and dry.  Neurological:     General: No focal deficit present.     Mental Status: He is alert and oriented to person, place, and time.  Psychiatric:        Mood and Affect: Mood normal.        Behavior: Behavior normal.  UC Treatments / Results  Labs (all labs ordered are listed, but only abnormal results are displayed) Labs Reviewed - No data to display  EKG   Radiology No results found.  Procedures Procedures (including critical care time)  Medications Ordered in UC Medications - No data to display  Initial Impression / Assessment and Plan / UC Course  I have reviewed the triage vital signs and the nursing notes.  Pertinent labs & imaging results that were available during my care of the patient were reviewed by me and considered in my medical decision making (see chart for details).  Patient with chronic low back and right hip pain.  Symptoms worsened over the past 2 days.  No inciting injury or trauma  present.  Patient does have point tenderness to the lumbar spine between L2-L4.  He also has point tenderness to the right hip.  Toradol 30 mg IM administered.  Patient advised to continue diclofenac 75 mg.  Offered imaging to patient and his as he has not had any in the past 2 years, patient declined.  Supportive care recommendations were provided and discussed with the patient to include the use of ice or heat, and gentle range of motion exercises.  Patient was advised to follow-up with his PCP or with orthopedics for ongoing symptoms.  Discussed strict ER follow-up precautions.  Patient was in agreement with this plan of care and verbalized understanding.  All questions were answered.  Patient stable for discharge.  Work note was provided.  Final Clinical Impressions(s) / UC Diagnoses   Final diagnoses:  None   Discharge Instructions   None    ED Prescriptions   None    PDMP not reviewed this encounter.   Abran Cantor, NP 09/01/23 [phone]

## 2023-09-01 NOTE — Discharge Instructions (Signed)
 You have been given an injection of Toradol 30 mg today. Do not take any additional NSAIDs such as Aleve, ibuprofen, Advil, naproxen, or Motrin today. May apply ice or heat as needed to help with symptoms.  Apply ice for pain or swelling, heat for spasm or stiffness.  Apply for 20 minutes, remove for 1 hour, repeat as needed. Try to remain as active as possible. I provided gentle stretching exercises for you to perform at home.  Try to perform the exercises at least 2-3 times daily. Go to the emergency department immediately if you experience sudden loss of bowel or bladder function, numbness or tingling in legs or feet, or become unable to ambulate. As discussed, please follow-up with your PCP to discuss next steps for treatment for your chronic symptoms. Follow-up as needed.

## 2023-09-01 NOTE — ED Triage Notes (Signed)
 Right hip pain and lower back pain x 2 days.  States has had pain for months but the past 2 days, pain has gotten worse.

## 2023-09-05 ENCOUNTER — Other Ambulatory Visit: Payer: Self-pay | Admitting: Internal Medicine

## 2023-09-07 ENCOUNTER — Other Ambulatory Visit: Payer: Self-pay | Admitting: Internal Medicine

## 2023-09-11 ENCOUNTER — Encounter: Payer: Self-pay | Admitting: Internal Medicine

## 2023-09-11 ENCOUNTER — Ambulatory Visit: Payer: 59 | Admitting: Internal Medicine

## 2023-09-11 VITALS — BP 124/77 | HR 85 | Ht 71.0 in | Wt 340.4 lb

## 2023-09-11 DIAGNOSIS — Z7984 Long term (current) use of oral hypoglycemic drugs: Secondary | ICD-10-CM

## 2023-09-11 DIAGNOSIS — E1165 Type 2 diabetes mellitus with hyperglycemia: Secondary | ICD-10-CM

## 2023-09-11 DIAGNOSIS — M25551 Pain in right hip: Secondary | ICD-10-CM

## 2023-09-11 DIAGNOSIS — G8929 Other chronic pain: Secondary | ICD-10-CM | POA: Diagnosis not present

## 2023-09-11 MED ORDER — LIRAGLUTIDE 18 MG/3ML ~~LOC~~ SOPN: 1.8000 mg | PEN_INJECTOR | Freq: Every day | SUBCUTANEOUS | 1 refills | Status: DC

## 2023-09-11 NOTE — Patient Instructions (Signed)
 It was a pleasure to see you today.  Thank you for giving [place] the opportunity to be involved in your care.  Below is a brief recap of your visit and next steps.  We will plan to see you again in 3 months.  Summary Increase Victoza to 1.8 mg daily Orthopedic surgery referral placed Follow up in 3 months

## 2023-09-11 NOTE — Progress Notes (Signed)
 Established Patient Office Visit  Subjective   Patient ID: Jesse Castillo, male    DOB: 21-Mar-1966  Age: 58 y.o. MRN: [id number]  Chief Complaint  Patient presents with   Diabetes    Follow up    Hip Pain    Right hip pain , that will radiated to the knee with popping and soreness. 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 returns to care today for DM follow-up.  He was last evaluated by me on 2/5 at which time metformin XR was increased to 1000 mg twice daily and liraglutide 0.6 mg daily was prescribed.  4-week follow-up for diabetes management was arranged.  He additionally endorsed poorly controlled symptoms of insomnia.  Trazodone was increased to 150 mg nightly and hydroxyzine was added for as needed relief of anxiety/insomnia.  ED presentation on 3/2 endorsing low back and right hip pain.  There have otherwise been no acute interval events.  Today he reports feeling fairly well.  He endorses acute on chronic right hip and low back pain.  Pain recently relieved with Toradol injection.  He is using diclofenac for as needed pain relief as well.  Requesting orthopedic surgery referral today for further evaluation.  Past Medical History:  Diagnosis Date   Enlarged prostate    GERD (gastroesophageal reflux disease)    Hypertension    Neuropathy    RLE   Pneumonia due to COVID-19 virus 07/2020   Restless leg    Sleep apnea    Tremor of left hand    Past Surgical History:  Procedure Laterality Date   HERNIA REPAIR     Social History   Tobacco Use   Smoking status: Former    Current packs/day: 0.00    Average packs/day: 3.0 packs/day for 18.0 years (54.0 ttl pk-yrs)    Types: Cigarettes    Start date: 07/02/1989    Quit date: 07/03/2007    Years since quitting: 16.2   Smokeless tobacco: Never  Vaping Use   Vaping status: Never Used  Substance Use Topics   Alcohol use: Never   Drug use: Never   Family History  Problem Relation Age of Onset   Heart disease Mother     Tremor Father    Hypertension Daughter    Heart disease Maternal Grandmother    Parkinson's disease Paternal Grandmother    Allergies  Allergen Reactions   Mobic [Meloxicam] Other (See Comments)    Mouth sores   Review of Systems  Musculoskeletal:  Positive for back pain (Right lower back) and joint pain (Right hip).     Objective:     BP 124/77 (BP Location: Left Arm, Patient Position: Sitting, Cuff Size: Large)   Pulse 85   Ht 5\' 11"  (1.803 m)   Wt (!) 340 lb 6.4 oz (154.4 kg)   SpO2 94%   BMI 47.48 kg/m  BP Readings from Last 3 Encounters:  09/11/23 124/77  09/01/23 120/72  08/07/23 (!) 147/79   Physical Exam Vitals reviewed.  Constitutional:      General: He is not in acute distress.    Appearance: Normal appearance. He is obese. He is not ill-appearing.  HENT:     Head: Normocephalic and atraumatic.     Right Ear: External ear normal.     Left Ear: External ear normal.     Nose: Nose normal. No congestion or rhinorrhea.     Mouth/Throat:     Mouth: Mucous membranes are moist.  Pharynx: Oropharynx is clear.  Eyes:     General: No scleral icterus.    Extraocular Movements: Extraocular movements intact.     Conjunctiva/sclera: Conjunctivae normal.     Pupils: Pupils are equal, round, and reactive to light.  Cardiovascular:     Rate and Rhythm: Normal rate and regular rhythm.     Pulses: Normal pulses.     Heart sounds: Normal heart sounds. No murmur heard. Pulmonary:     Effort: Pulmonary effort is normal.     Breath sounds: Normal breath sounds. No wheezing, rhonchi or rales.  Abdominal:     General: Abdomen is flat. Bowel sounds are normal. There is no distension.     Palpations: Abdomen is soft.     Tenderness: There is no abdominal tenderness.  Musculoskeletal:        General: No swelling or deformity. Normal range of motion.     Cervical back: Normal range of motion.     Comments: ROM at the waist and right hip are generally intact  Skin:     General: Skin is warm and dry.     Capillary Refill: Capillary refill takes less than 2 seconds.  Neurological:     General: No focal deficit present.     Mental Status: He is alert and oriented to person, place, and time.     Motor: No weakness.  Psychiatric:        Mood and Affect: Mood normal.        Behavior: Behavior normal.        Thought Content: Thought content normal.   Last CBC Lab Results  Component Value Date   WBC 10.2 06/27/2023   HGB 17.2 (H) 06/27/2023   HCT 48.7 06/27/2023   MCV 89.2 06/27/2023   MCH 31.5 06/27/2023   RDW 14.0 06/27/2023   PLT 174 06/27/2023   Last metabolic panel Lab Results  Component Value Date   GLUCOSE 351 (H) 06/27/2023   NA 137 06/27/2023   K 4.2 06/27/2023   CL 101 06/27/2023   CO2 24 06/27/2023   BUN 24 (H) 06/27/2023   CREATININE 1.12 06/27/2023   GFRNONAA >60 06/27/2023   CALCIUM 9.3 06/27/2023   PROT 6.7 01/28/2023   ALBUMIN 4.2 01/28/2023   LABGLOB 2.5 01/28/2023   AGRATIO 1.8 03/07/2022   BILITOT 0.4 01/28/2023   ALKPHOS 103 01/28/2023   AST 49 (H) 01/28/2023   ALT 58 (H) 01/28/2023   ANIONGAP 12 06/27/2023   Last lipids Lab Results  Component Value Date   CHOL 172 10/09/2023   HDL 25 (L) 10/09/2023   LDLCALC 86 10/09/2023   TRIG 307 (H) 10/09/2023   CHOLHDL 6.9 10/09/2023   Last hemoglobin A1c Lab Results  Component Value Date   HGBA1C 8.7 (H) 07/04/2023   Last thyroid functions Lab Results  Component Value Date   TSH 3.130 05/07/2023   Last vitamin B12 and Folate Lab Results  Component Value Date   VITAMINB12 556 03/07/2022   FOLATE >20.0 03/07/2022   The 10-year ASCVD risk score (Arnett DK, et al., 2019) is: 17.5%    Assessment & Plan:   Problem List Items Addressed This Visit       Type 2 diabetes mellitus with hyperglycemia (HCC) - Primary   Returning to care today for follow-up of diabetes mellitus.  A1c 8.7 on labs from January.  Most recently, metformin XR was increased 1000 mg twice  daily and Victoza 0.6 mg daily was started.  He was  also counseled on the importance of adhering to healthy dieting habits.  Victoza has since been increased to 1.2 mg daily.  He is tolerating it well and checking his blood sugar regularly.  Readings are gradually improving.  He has recent documented AM readings of 129 and 157. -Treatment options reviewed.  Recommend continuing metformin XR 1000 g twice daily and increasing Victoza to 1.8 mg daily. - We will tentatively plan for follow-up in 3 months and repeat A1c at that time      Chronic right hip pain   His acute concern today is acute on chronic right hip and right lower back pain.  As otherwise documented, ED presentation on 3/2 endorsing acute on chronic pain.  Pain relieved with Toradol injection.  He is using diclofenac as needed for pain relief.  He would like to establish care with orthopedic surgery.  Referral placed today.  Previous x-rays of the right hip from October 2023 had shown mild right acetabular osteophytosis with preserved superior joint space.      Return in about 3 months (around 12/12/2023).   Billie Lade, MD

## 2023-09-17 ENCOUNTER — Ambulatory Visit: Payer: 59 | Admitting: Adult Health

## 2023-09-20 ENCOUNTER — Other Ambulatory Visit: Payer: Self-pay | Admitting: Internal Medicine

## 2023-09-20 ENCOUNTER — Other Ambulatory Visit: Payer: Self-pay | Admitting: Family Medicine

## 2023-09-20 DIAGNOSIS — J309 Allergic rhinitis, unspecified: Secondary | ICD-10-CM

## 2023-09-20 DIAGNOSIS — G629 Polyneuropathy, unspecified: Secondary | ICD-10-CM

## 2023-09-20 DIAGNOSIS — I10 Essential (primary) hypertension: Secondary | ICD-10-CM

## 2023-09-20 DIAGNOSIS — G2581 Restless legs syndrome: Secondary | ICD-10-CM

## 2023-10-09 ENCOUNTER — Other Ambulatory Visit (HOSPITAL_COMMUNITY)
Admission: RE | Admit: 2023-10-09 | Discharge: 2023-10-09 | Disposition: A | Discharge status: Home / Self Care | Source: Ambulatory Visit | Attending: Internal Medicine | Admitting: Internal Medicine

## 2023-10-09 ENCOUNTER — Encounter: Payer: Self-pay | Admitting: Internal Medicine

## 2023-10-09 DIAGNOSIS — E782 Mixed hyperlipidemia: Secondary | ICD-10-CM | POA: Insufficient documentation

## 2023-10-09 DIAGNOSIS — G8929 Other chronic pain: Secondary | ICD-10-CM | POA: Insufficient documentation

## 2023-10-09 LAB — LIPID PANEL
Cholesterol: 172 mg/dL (ref 0–200)
HDL: 25 mg/dL — ABNORMAL LOW (ref >40)
LDL Cholesterol: 86 mg/dL (ref 0–99)
Total CHOL/HDL Ratio: 6.9 ratio
Triglycerides: 307 mg/dL — ABNORMAL HIGH (ref <150)
VLDL: 61 mg/dL — ABNORMAL HIGH (ref 0–40)

## 2023-10-09 NOTE — Assessment & Plan Note (Addendum)
 Returning to care today for follow-up of diabetes mellitus.  A1c 8.7 on labs from January.  Most recently, metformin XR was increased 1000 mg twice daily and Victoza 0.6 mg daily was started.  He was also counseled on the importance of adhering to healthy dieting habits.  Victoza has since been increased to 1.2 mg daily.  He is tolerating it well and checking his blood sugar regularly.  Readings are gradually improving.  He has recent documented AM readings of 129 and 157. -Treatment options reviewed.  Recommend continuing metformin XR 1000 g twice daily and increasing Victoza to 1.8 mg daily. - We will tentatively plan for follow-up in 3 months and repeat A1c at that time

## 2023-10-09 NOTE — Assessment & Plan Note (Signed)
 His acute concern today is acute on chronic right hip and right lower back pain.  As otherwise documented, ED presentation on 3/2 endorsing acute on chronic pain.  Pain relieved with Toradol injection.  He is using diclofenac as needed for pain relief.  He would like to establish care with orthopedic surgery.  Referral placed today.  Previous x-rays of the right hip from October 2023 had shown mild right acetabular osteophytosis with preserved superior joint space.

## 2023-10-16 ENCOUNTER — Other Ambulatory Visit (INDEPENDENT_AMBULATORY_CARE_PROVIDER_SITE_OTHER): Payer: Self-pay

## 2023-10-16 ENCOUNTER — Ambulatory Visit (INDEPENDENT_AMBULATORY_CARE_PROVIDER_SITE_OTHER): Admitting: Orthopaedic Surgery

## 2023-10-16 DIAGNOSIS — M5441 Lumbago with sciatica, right side: Secondary | ICD-10-CM

## 2023-10-16 DIAGNOSIS — G8929 Other chronic pain: Secondary | ICD-10-CM

## 2023-10-16 MED ORDER — CYCLOBENZAPRINE HCL 10 MG PO TABS
10.0000 mg | ORAL_TABLET | Freq: Three times a day (TID) | ORAL | 1 refills | Status: DC | PRN
Start: 2023-10-16 — End: 2023-11-11

## 2023-10-16 NOTE — Progress Notes (Signed)
 The patient is a 59 year old gentleman I am seeing for the first time.  He sent from Dr. Assunta Lax PCP to evaluate and treat chronic low back pain and right hip pain.  He points to the lower aspect of his lumbar spine as a source of his pain that radiates into his right hip.  He does get occasional left groin pain and right shoulder pain.  He is someone that the last listed weight was 340 pounds.  He is a diabetic and his A1c 3 months ago was over 8.  He has been on chronic diclofenac and they would like him off of anti-inflammatories.  I do not see a very high creatinine looking at his labs but he has been on diclofenac for long period time and they have suggested just Tylenol which is not helping him.  There has been no change in bowel or bladder function or weakness.  He does have pain though with standing all day and he cannot walk for long peers time or stand for long peers of time due to significant back pain.  Of note his wife had hip replacement surgery by Dr. Phyllis Breeze in [place] just 2 weeks ago and is very pleased with Dr. Phyllis Breeze.  The patient was hoping to rather just follow-up with Dr. Phyllis Breeze in [place] and would rather be in [place] for his orthopedic care.  Both assessment smoothly and fluidly no block to rotation at all.  He has limited mobility of his lumbar spine.  He does not have a positive straight leg raise bilaterally but he has significant neuropathy in his feet and he is on Neurontin.  2 views lumbar spine showed no acute findings with normal lumbar lordosis and normal alignment overall.  There are some bridging osteophytes at the lower thoracic spine but not consistent with ankylosing spondylitis.  I did see a CT scan that showed his lumbar spine and hips from back in October of last year.  There was some arthritic changes in the lumbar spine.  The hip joint spaces are congruent bilaterally but the right hip does have some subchondral cystic changes in the acetabulum and  superior lateral femoral head neck.  From my standpoint I am going to start him on Flexeril and would like to send him to outpatient physical therapy in [place] with physical therapy on his lumbar spine.  I have recommended certainly weight loss.  We should also at this point obtain an MRI of his lumbar spine given the severity of his chronic low back pain.  We can see him back here after follow-up from his MRI or physical therapy and he can also look to see if following up potentially with Dr. Phyllis Breeze.

## 2023-10-17 ENCOUNTER — Other Ambulatory Visit: Payer: Self-pay

## 2023-10-17 DIAGNOSIS — G8929 Other chronic pain: Secondary | ICD-10-CM

## 2023-10-19 ENCOUNTER — Other Ambulatory Visit: Payer: Self-pay | Admitting: Internal Medicine

## 2023-10-21 ENCOUNTER — Other Ambulatory Visit: Payer: Self-pay | Admitting: Internal Medicine

## 2023-10-22 ENCOUNTER — Encounter: Payer: Self-pay | Admitting: Orthopaedic Surgery

## 2023-10-22 ENCOUNTER — Other Ambulatory Visit: Payer: Self-pay

## 2023-10-22 DIAGNOSIS — E781 Pure hyperglyceridemia: Secondary | ICD-10-CM

## 2023-10-22 DIAGNOSIS — E782 Mixed hyperlipidemia: Secondary | ICD-10-CM

## 2023-10-25 ENCOUNTER — Encounter: Payer: Self-pay | Admitting: Orthopaedic Surgery

## 2023-10-25 ENCOUNTER — Ambulatory Visit
Admission: RE | Admit: 2023-10-25 | Discharge: 2023-10-25 | Disposition: A | Discharge status: Home / Self Care | Source: Ambulatory Visit | Attending: Orthopaedic Surgery | Admitting: Orthopaedic Surgery

## 2023-10-25 DIAGNOSIS — G8929 Other chronic pain: Secondary | ICD-10-CM

## 2023-10-25 NOTE — Telephone Encounter (Signed)
 CT ordered.

## 2023-10-28 ENCOUNTER — Other Ambulatory Visit: Payer: Self-pay | Admitting: Orthopaedic Surgery

## 2023-10-28 DIAGNOSIS — G8929 Other chronic pain: Secondary | ICD-10-CM

## 2023-11-01 ENCOUNTER — Encounter: Payer: Self-pay | Admitting: Orthopaedic Surgery

## 2023-11-04 NOTE — Discharge Instructions (Signed)

## 2023-11-05 ENCOUNTER — Ambulatory Visit
Admission: RE | Admit: 2023-11-05 | Discharge: 2023-11-05 | Disposition: A | Discharge status: Home / Self Care | Source: Ambulatory Visit | Attending: Orthopaedic Surgery | Admitting: Orthopaedic Surgery

## 2023-11-05 ENCOUNTER — Encounter: Payer: Self-pay | Admitting: Orthopaedic Surgery

## 2023-11-05 DIAGNOSIS — G8929 Other chronic pain: Secondary | ICD-10-CM

## 2023-11-05 DIAGNOSIS — M47816 Spondylosis without myelopathy or radiculopathy, lumbar region: Secondary | ICD-10-CM | POA: Diagnosis not present

## 2023-11-05 DIAGNOSIS — M48061 Spinal stenosis, lumbar region without neurogenic claudication: Secondary | ICD-10-CM | POA: Diagnosis not present

## 2023-11-05 MED ORDER — MEPERIDINE HCL 50 MG/ML IJ SOLN: 50.0000 mg | Freq: Once | INTRAMUSCULAR | Status: DC | PRN

## 2023-11-05 MED ORDER — DIAZEPAM 5 MG PO TABS: 10.0000 mg | ORAL_TABLET | Freq: Once | ORAL | Status: DC

## 2023-11-05 MED ORDER — IOPAMIDOL (ISOVUE-M 200) INJECTION 41%
20.0000 mL | Freq: Once | INTRAMUSCULAR | Status: AC
  Administered 2023-11-05: 20 mL via INTRATHECAL

## 2023-11-05 MED ORDER — ONDANSETRON HCL 4 MG/2ML IJ SOLN: 4.0000 mg | Freq: Once | INTRAMUSCULAR | Status: DC | PRN

## 2023-11-07 ENCOUNTER — Other Ambulatory Visit: Payer: Self-pay

## 2023-11-07 DIAGNOSIS — G8929 Other chronic pain: Secondary | ICD-10-CM

## 2023-11-11 ENCOUNTER — Ambulatory Visit: Admitting: Orthopedic Surgery

## 2023-11-11 ENCOUNTER — Other Ambulatory Visit: Payer: Self-pay

## 2023-11-11 DIAGNOSIS — G8929 Other chronic pain: Secondary | ICD-10-CM

## 2023-11-11 DIAGNOSIS — M5441 Lumbago with sciatica, right side: Secondary | ICD-10-CM | POA: Diagnosis not present

## 2023-11-11 MED ORDER — CYCLOBENZAPRINE HCL 10 MG PO TABS: 10.0000 mg | ORAL_TABLET | Freq: Three times a day (TID) | ORAL | 2 refills | Status: DC | PRN

## 2023-11-11 NOTE — Progress Notes (Signed)
 Orthopedic Spine Surgery Office Note  Assessment: Patient is a 58 y.o. male with low back pain that radiates into the right lateral hip.  Has stenosis at L2/3 and L3/4. It radiates in a L4 dermatome   Plan: -Explained that initially conservative treatment is tried as a significant number of patients may experience relief with these treatment modalities. Discussed that the conservative treatments include:  -activity modification  -physical therapy  -over the counter pain medications  -medrol dosepak  -lumbar steroid injections -Patient has tried Tylenol , ibuprofen, Flexeril  - Has found Flexeril  somewhat helpful so new prescription was provided to him today -Recommend diagnostic/therapeutic lumbar injection.  Referral provided today -Would need to get down to a BMI of 40 and A1c <7.5 prior to any elective spine surgery. Is working on weight loss with medication, encouraged him to continue -Patient can return to the office on an as needed basis    Patient expressed understanding of the plan and all questions were answered to the patient's satisfaction.   ___________________________________________________________________________   History:  Patient is a 58 y.o. male who presents today for lumbar spine.  Patient has had several years of low back pain which has gotten progressively worse with time.  He feels the pain in his lower lumbar back and it radiates into the right lateral hip.  It does not radiate past the hip.  He has no pain radiating into the left lower extremity.  He notices the pain is worse if he is standing or walking.  He said he can stand or walk for about 5 minutes but then pain starts.  He says soon as he sits down pain resolves.  He has not noticed if leaning over a shopping cart or flexing the lumbar spine is helpful for his pain.  There is no trauma or injury that preceded the onset of his pain   Weakness: Denies Symptoms of imbalance: Denies Paresthesias and numbness:  Denies Bowel or bladder incontinence: Denies Saddle anesthesia: Denies  Treatments tried: Tylenol , ibuprofen, Flexeril   Review of systems: Denies fevers and chills, night sweats, unexplained weight loss, history of cancer.  Has had pain that wakes him at night  Past medical history: BPH OSA GERD HTN Depression/anxiety Diabetes (last A1c was 8.7)  Allergies: mobic   Past surgical history:  Hernia repair  Social history: Denies use of nicotine product (smoking, vaping, patches, smokeless) Alcohol use: denies Denies recreational drug use   Physical Exam:  General: no acute distress, appears stated age Neurologic: alert, answering questions appropriately, following commands Respiratory: unlabored breathing on room air, symmetric chest rise Psychiatric: appropriate affect, normal cadence to speech   MSK (spine):  -Strength exam      Left  Right EHL    5/5  5/5 TA    5/5  5/5 GSC    5/5  5/5 Knee extension  5/5  5/5 Hip flexion   5/5  5/5  -Sensory exam    Sensation intact to light touch in L3-S1 nerve distributions of bilateral lower extremities  -Achilles DTR: 1/4 on the left, 1/4 on the right -Patellar tendon DTR: 1/4 on the left, 1/4 on the right  -Straight leg raise: negative bilaterally -Femoral nerve stretch test: negative bilaterally -Clonus: no beats bilaterally  -Left hip exam: No pain through range of motion, negative Stinchfield, negative FABER -Right hip exam: Positive FADIR, no pain to remainder of range of motion, negative Stinchfield, negative FABER  Imaging: XRs of the lumbar spine from 11/11/2023 were independently reviewed and interpreted,  showing no significant degenerative changes. No evidence of instability on flexion/extension views. No fracture or dislocation seen.   CT myelogram of the lumbar spine from 11/05/2023 was independently reviewed and interpreted, showing central stenosis at L2/3 and L3/4. Bilateral foraminal stenosis at L5/S1.  Facet arthropathy at L4/5.    Patient name: Jesse Castillo Patient MRN: [id number] Date of visit: 11/11/23

## 2023-11-15 ENCOUNTER — Ambulatory Visit (HOSPITAL_COMMUNITY): Payer: Self-pay

## 2023-11-18 ENCOUNTER — Other Ambulatory Visit: Payer: Self-pay | Admitting: Internal Medicine

## 2023-11-18 ENCOUNTER — Other Ambulatory Visit: Payer: Self-pay | Admitting: Family Medicine

## 2023-11-18 DIAGNOSIS — K219 Gastro-esophageal reflux disease without esophagitis: Secondary | ICD-10-CM

## 2023-11-18 DIAGNOSIS — E119 Type 2 diabetes mellitus without complications: Secondary | ICD-10-CM

## 2023-12-03 ENCOUNTER — Ambulatory Visit: Admitting: Physical Medicine and Rehabilitation

## 2023-12-03 ENCOUNTER — Other Ambulatory Visit: Payer: Self-pay

## 2023-12-03 VITALS — BP 122/84 | HR 97

## 2023-12-03 DIAGNOSIS — M5416 Radiculopathy, lumbar region: Secondary | ICD-10-CM

## 2023-12-03 MED ORDER — METHYLPREDNISOLONE ACETATE 40 MG/ML IJ SUSP
40.0000 mg | Freq: Once | INTRAMUSCULAR | Status: AC
  Administered 2023-12-03: 40 mg

## 2023-12-03 NOTE — Patient Instructions (Signed)

## 2023-12-03 NOTE — Progress Notes (Unsigned)
 Pain Scale   Average Pain 5 Patient advising he has chronic back pain and at times ir radiates to right hip area.        +Driver, -BT, -Dye Allergies.

## 2023-12-04 ENCOUNTER — Telehealth: Payer: Self-pay

## 2023-12-04 ENCOUNTER — Other Ambulatory Visit: Payer: Self-pay | Admitting: Internal Medicine

## 2023-12-04 DIAGNOSIS — E1165 Type 2 diabetes mellitus with hyperglycemia: Secondary | ICD-10-CM

## 2023-12-04 NOTE — Telephone Encounter (Signed)
 Copied from CRM [id number]. Topic: Clinical - Medication Question >> Dec 04, 2023 11:24 AM Everlene Hobby D wrote: Tammy at [facility] pharmacy is calling to request a 18 gauge syringe 1 inch for the patient he needs this prescription.

## 2023-12-05 NOTE — Progress Notes (Signed)
 Jesse Castillo - 58 y.o. male MRN [id number]  Date of birth: 08-19-1965  Office Visit Note: Visit Date: 12/03/2023 PCP: Tobi Fortes, MD Referred by: Diedra Fowler, MD  Subjective: Chief Complaint  Patient presents with   Lower Back - Pain   HPI:  Jesse Castillo is a 58 y.o. male who comes in today at the request of Dr. Colette Davies for planned Right L2-3 Lumbar Interlaminar epidural steroid injection with fluoroscopic guidance.  The patient has failed conservative care including home exercise, medications, time and activity modification.  This injection will be diagnostic and hopefully therapeutic.  Please see requesting physician notes for further details and justification.  Please see procedure note: To the patient's body habitus and size and the level that we injected we had a lot of difficulty entering the epidural space.  Sometimes the bony narrowing and angles do not work out compared to seeing the opening on the MRI.  Depending on his relief with the injection would consider or have Dr. Sulema Endo consider transforaminal approach.   ROS Otherwise per HPI.  Assessment & Plan: Visit Diagnoses:    ICD-10-CM   1. Lumbar radiculopathy  M54.16 XR C-ARM NO REPORT    Epidural Steroid injection    methylPREDNISolone acetate (DEPO-MEDROL) injection 40 mg      Plan: No additional findings.   Meds & Orders:  Meds ordered this encounter  Medications   methylPREDNISolone acetate (DEPO-MEDROL) injection 40 mg    Orders Placed This Encounter  Procedures   XR C-ARM NO REPORT   Epidural Steroid injection    Follow-up: No follow-ups on file.   Procedures: No procedures performed  Lumbar Epidural Steroid Injection - Interlaminar Approach with Fluoroscopic Guidance  Patient: Jesse Castillo      Date of Birth: 11-24-1965 MRN: [id number] PCP: Tobi Fortes, MD      Visit Date: 12/03/2023   Universal Protocol:     Consent Given By: the patient  Position: PRONE  Additional  Comments: Vital signs were monitored before and after the procedure. Patient was prepped and draped in the usual sterile fashion. The correct patient, procedure, and site was verified.   Injection Procedure Details:   Procedure diagnoses: Lumbar radiculopathy [M54.16]   Meds Administered:  Meds ordered this encounter  Medications   methylPREDNISolone acetate (DEPO-MEDROL) injection 40 mg     Laterality: Right  Location/Site:  L2-3  Needle: 3.5 in., 20 ga. Tuohy  Needle Placement: Paramedian epidural  Findings:   -Comments: We had moderate difficulty with obtaining good loss-of-resistance and entry into the epidural space.  There were multiple changes of angles to get a more lateral approach and better imaging.  Ultimately felt like we succeeded from a field standpoint and then contrasted show epidural spread.  Consider transforaminal approach.  Procedure Details: Using a paramedian approach from the side mentioned above, the region overlying the inferior lamina was localized under fluoroscopic visualization and the soft tissues overlying this structure were infiltrated with 4 ml. of 1% Lidocaine without Epinephrine. The Tuohy needle was inserted into the epidural space using a paramedian approach.   The epidural space was localized using loss of resistance along with counter oblique bi-planar fluoroscopic views.  After negative aspirate for air, blood, and CSF, a 2 ml. volume of Isovue -250 was injected into the epidural space and the flow of contrast was observed. Radiographs were obtained for documentation purposes.    The injectate was administered into the level noted above.  Additional Comments:  The patient tolerated the procedure well Dressing: 2 x 2 sterile gauze and Band-Aid    Post-procedure details: Patient was observed during the procedure. Post-procedure instructions were reviewed.  Patient left the clinic in stable condition.   Clinical History: CT LUMBAR  MYELOGRAM FINDINGS:   Vertebral alignment is normal. No fracture is identified. Multiple Schmorl's nodes are present, most prominently involving the L3 superior endplate. There is diffuse congenital narrowing of the lumbar spinal canal due to short pedicles. The conus medullaris terminates at L1-2. The right kidney is mildly low lying. Hepatic steatosis is noted.   T12-L1: Disc bulging, a left paracentral to subarticular disc protrusion, and mild facet and ligamentum flavum hypertrophy result in mild spinal stenosis and moderate left lateral recess stenosis without neural foraminal stenosis.   L1-2: Minimal disc bulging and mild facet hypertrophy without significant stenosis.   L2-3: Circumferential disc bulging and moderate facet and ligamentum flavum hypertrophy result in moderate spinal stenosis, mild bilateral lateral recess stenosis, and mild bilateral neural foraminal stenosis.   L3-4: Circumferential disc bulging, a right foraminal to extraforaminal disc osteophyte complex, and moderate facet and ligamentum flavum hypertrophy result in mild spinal stenosis, mild-to-moderate right and mild left lateral recess stenosis, and moderate right and mild left neural foraminal stenosis.   L4-5: Disc bulging, ligamentum flavum hypertrophy, and markedly severe right and moderate to severe left facet arthrosis result in mild-to-moderate spinal stenosis, mild-to-moderate bilateral lateral recess stenosis, and moderate to severe right and mild-to-moderate left neural foraminal stenosis. There are right-sided facet erosions.   L5-S1: Disc bulging, endplate spurring, and mild right and moderate left facet arthrosis result in mild left greater than right lateral recess stenosis and mild-to-moderate bilateral neural foraminal stenosis without spinal stenosis.   IMPRESSION: 1. 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 2. Moderate spinal  stenosis at L2-3, L3-4, and L4-5 with standing. 3. Markedly severe right facet arthrosis at L4-5 with moderate to severe right neural foraminal stenosis. 4. Moderate right neural foraminal stenosis at L3-4. 5. Mild-to-moderate bilateral neural foraminal stenosis at L5-S1.     Electronically Signed   By: Aundra Lee M.D.   On: 11/05/2023 14:34     Objective:  VS:  HT:    WT:   BMI:     BP:122/84  HR:97bpm  TEMP: ( )  RESP:  Physical Exam Vitals and nursing note reviewed.  Constitutional:      General: He is not in acute distress.    Appearance: Normal appearance. He is well-developed. He is obese. He is not ill-appearing.  HENT:     Head: Normocephalic and atraumatic.     Right Ear: External ear normal.     Left Ear: External ear normal.     Nose: No congestion.  Eyes:     Extraocular Movements: Extraocular movements intact.     Conjunctiva/sclera: Conjunctivae normal.     Pupils: Pupils are equal, round, and reactive to light.  Cardiovascular:     Rate and Rhythm: Normal rate.     Pulses: Normal pulses.     Heart sounds: Normal heart sounds.  Pulmonary:     Effort: Pulmonary effort is normal. No respiratory distress.  Abdominal:     General: There is no distension.     Palpations: Abdomen is soft.  Musculoskeletal:        General: No tenderness or signs of injury.     Cervical back: Normal range of motion and neck supple. No  rigidity.     Right lower leg: No edema.     Left lower leg: No edema.     Comments: Patient has good distal strength without clonus.  Skin:    General: Skin is warm and dry.     Findings: No erythema or rash.  Neurological:     General: No focal deficit present.     Mental Status: He is alert and oriented to person, place, and time.     Sensory: No sensory deficit.     Motor: No weakness or abnormal muscle tone.     Coordination: Coordination normal.     Gait: Gait normal.  Psychiatric:        Mood and Affect: Mood normal.         Behavior: Behavior normal.      Imaging: No results found.

## 2023-12-05 NOTE — Procedures (Signed)
 Lumbar Epidural Steroid Injection - Interlaminar Approach with Fluoroscopic Guidance  Patient: Jesse Castillo      Date of Birth: [birth year] MRN: [id number] PCP: Tobi Fortes, MD      Visit Date: 12/03/2023   Universal Protocol:     Consent Given By: the patient  Position: PRONE  Additional Comments: Vital signs were monitored before and after the procedure. Patient was prepped and draped in the usual sterile fashion. The correct patient, procedure, and site was verified.   Injection Procedure Details:   Procedure diagnoses: Lumbar radiculopathy [M54.16]   Meds Administered:  Meds ordered this encounter  Medications   methylPREDNISolone acetate (DEPO-MEDROL) injection 40 mg     Laterality: Right  Location/Site:  L2-3  Needle: 3.5 in., 20 ga. Tuohy  Needle Placement: Paramedian epidural  Findings:   -Comments: We had moderate difficulty with obtaining good loss-of-resistance and entry into the epidural space.  There were multiple changes of angles to get a more lateral approach and better imaging.  Ultimately felt like we succeeded from a field standpoint and then contrasted show epidural spread.  Consider transforaminal approach.  Procedure Details: Using a paramedian approach from the side mentioned above, the region overlying the inferior lamina was localized under fluoroscopic visualization and the soft tissues overlying this structure were infiltrated with 4 ml. of 1% Lidocaine without Epinephrine. The Tuohy needle was inserted into the epidural space using a paramedian approach.   The epidural space was localized using loss of resistance along with counter oblique bi-planar fluoroscopic views.  After negative aspirate for air, blood, and CSF, a 2 ml. volume of Isovue -250 was injected into the epidural space and the flow of contrast was observed. Radiographs were obtained for documentation purposes.    The injectate was administered into the level noted  above.   Additional Comments:  The patient tolerated the procedure well Dressing: 2 x 2 sterile gauze and Band-Aid    Post-procedure details: Patient was observed during the procedure. Post-procedure instructions were reviewed.  Patient left the clinic in stable condition.

## 2023-12-05 NOTE — Telephone Encounter (Signed)
 Todd advised to request this from the provider who fills the Testosterone 

## 2023-12-12 ENCOUNTER — Encounter: Payer: Self-pay | Admitting: Internal Medicine

## 2023-12-12 ENCOUNTER — Ambulatory Visit: Admitting: Nurse Practitioner

## 2023-12-12 ENCOUNTER — Encounter: Payer: Self-pay | Admitting: Nurse Practitioner

## 2023-12-12 ENCOUNTER — Ambulatory Visit: Admitting: Internal Medicine

## 2023-12-12 VITALS — BP 128/85 | HR 91 | Ht 71.0 in | Wt 327.6 lb

## 2023-12-12 VITALS — BP 131/85 | HR 94 | Ht 71.0 in | Wt 327.0 lb

## 2023-12-12 DIAGNOSIS — G252 Other specified forms of tremor: Secondary | ICD-10-CM | POA: Diagnosis not present

## 2023-12-12 DIAGNOSIS — G250 Essential tremor: Secondary | ICD-10-CM

## 2023-12-12 DIAGNOSIS — R7989 Other specified abnormal findings of blood chemistry: Secondary | ICD-10-CM | POA: Diagnosis not present

## 2023-12-12 DIAGNOSIS — Z87891 Personal history of nicotine dependence: Secondary | ICD-10-CM

## 2023-12-12 DIAGNOSIS — Z7984 Long term (current) use of oral hypoglycemic drugs: Secondary | ICD-10-CM | POA: Diagnosis not present

## 2023-12-12 DIAGNOSIS — E1165 Type 2 diabetes mellitus with hyperglycemia: Secondary | ICD-10-CM | POA: Diagnosis not present

## 2023-12-12 DIAGNOSIS — G4733 Obstructive sleep apnea (adult) (pediatric): Secondary | ICD-10-CM

## 2023-12-12 DIAGNOSIS — F419 Anxiety disorder, unspecified: Secondary | ICD-10-CM | POA: Diagnosis not present

## 2023-12-12 DIAGNOSIS — F32A Depression, unspecified: Secondary | ICD-10-CM

## 2023-12-12 MED ORDER — PRIMIDONE 50 MG PO TABS: 50.0000 mg | ORAL_TABLET | Freq: Four times a day (QID) | ORAL | 1 refills | Status: DC

## 2023-12-12 MED ORDER — SYRINGE/NEEDLE (DISP) 18G X 1" 3 ML MISC: 1.0000 | 1 refills | Status: AC

## 2023-12-12 MED ORDER — BUPROPION HCL ER (XL) 150 MG PO TB24: 150.0000 mg | ORAL_TABLET | Freq: Every day | ORAL | 2 refills | Status: DC

## 2023-12-12 NOTE — Patient Instructions (Signed)
 It was a pleasure to see you today.  Thank you for giving us  the opportunity to be involved in your care.  Below is a brief recap of your visit and next steps.  We will plan to see you again in 3 months.  Summary Add primidone for tremor today Start Wellbutrin  for anxiety and depression Check A1c I recommend following up with neurology for tremor Follow up in 3 months

## 2023-12-12 NOTE — Patient Instructions (Addendum)
 Continue to use CPAP every night, minimum of 4-6 hours a night.  Change equipment as directed. Wash your tubing with warm soap and water daily, hang to dry. Wash humidifier portion weekly. Use bottled, distilled water and change daily Be aware of reduced alertness and do not drive or operate heavy machinery if experiencing this or drowsiness.  Exercise encouraged, as tolerated. Healthy weight management discussed.  Avoid or decrease alcohol consumption and medications that make you more sleepy, if possible. Notify if persistent daytime sleepiness occurs even with consistent use of PAP therapy.  Look at these charity options for CPAP supplies... 1. [employer] - http://www.reggiewhitefoundation.org/  2. [employer] CPAP Link -  [employer].co.za  3. [employer] Then, you have the Mask Program, which sends you up to 4 masks, 4 filters, and 2 tubes for the low, low price of $100. To qualify, you must submit an application, pay the corresponding program fee, and provide a CPAP prescription.  Link - TagCams.com.cy   You can also go to this website... [email or web address]://[email or web address]/ Scroll down to eligibility and put your info in and they may be able to find more affordable options  Follow up in 1 year with Katie Lafayette Dunlevy,NP, or sooner, if needed

## 2023-12-12 NOTE — Progress Notes (Signed)
 Established Patient Office Visit  Subjective   Patient ID: Jesse Castillo, male    DOB: 01/07/1966  Age: 58 y.o. MRN: [id number]  Chief Complaint  Patient presents with   Diabetes    Three month follow up    Shaking    Shaking moving into left hand   Fussy    Moody, irritable starting to effect his marriage and work    Jesse Castillo returns to care today for routine follow-up.  He was last evaluated by me on 3/12 at which time Victoza  was increased to 1.8 mg daily.  He additionally endorsed chronic pain of the right hip and was referred to orthopedic surgery.  42-month follow-up was arranged for reassessment.  In the interim he has been evaluated by orthopedic surgery and underwent ESI of the lumbar spine on 6/3 in the setting of lumbar radiculopathy.  There have otherwise been no acute interval events.  Jesse Castillo has multiple concerns to discuss today.  He reports increased irritability and states that it is affecting his marriage as well as his relationship with coworkers.  He would like to discuss medication options for treatment of anxiety and depression.  His additional concern is increased tremor.  He has a documented history of action tremor that is most prominent in the left hand.  He has to use hand-held scanner regularly at work and has difficulty controlling it due to tremor.  He is currently prescribed propranolol  but would like to discuss additional medication options.  Past Medical History:  Diagnosis Date   Enlarged prostate    GERD (gastroesophageal reflux disease)    Hypertension    Neuropathy    RLE   Pneumonia due to COVID-19 virus 07/2020   Restless leg    Sleep apnea    Tremor of left hand    Past Surgical History:  Procedure Laterality Date   HERNIA REPAIR     Social History   Tobacco Use   Smoking status: Former    Current packs/day: 0.00    Average packs/day: 3.0 packs/day for 18.0 years (54.0 ttl pk-yrs)    Types: Cigarettes    Start date: 07/02/1989     Quit date: 07/03/2007    Years since quitting: 16.4   Smokeless tobacco: Never  Vaping Use   Vaping status: Never Used  Substance Use Topics   Alcohol use: Never   Drug use: Never   Family History  Problem Relation Age of Onset   Heart disease Mother    Tremor Father    Hypertension Daughter    Heart disease Maternal Grandmother    Parkinson's disease Paternal Grandmother    Allergies  Allergen Reactions   Mobic  [Meloxicam ] Other (See Comments)    Mouth sores   Review of Systems  Neurological:  Positive for tremors.  Psychiatric/Behavioral:         Increased irritability  All other systems reviewed and are negative.    Objective:     BP 128/85   Pulse 91   Ht 5' 11 (1.803 m)   Wt (!) 327 lb 9.6 oz (148.6 kg)   SpO2 97%   BMI 45.69 kg/m  BP Readings from Last 3 Encounters:  12/12/23 131/85  12/12/23 128/85  12/03/23 122/84   Physical Exam Vitals reviewed.  Constitutional:      General: He is not in acute distress.    Appearance: Normal appearance. He is obese. He is not ill-appearing.  HENT:     Head: Normocephalic and  atraumatic.     Right Ear: External ear normal.     Left Ear: External ear normal.     Nose: Nose normal. No congestion or rhinorrhea.     Mouth/Throat:     Mouth: Mucous membranes are moist.     Pharynx: Oropharynx is clear.   Eyes:     General: No scleral icterus.    Extraocular Movements: Extraocular movements intact.     Conjunctiva/sclera: Conjunctivae normal.     Pupils: Pupils are equal, round, and reactive to light.    Cardiovascular:     Rate and Rhythm: Normal rate and regular rhythm.     Pulses: Normal pulses.     Heart sounds: Normal heart sounds. No murmur heard. Pulmonary:     Effort: Pulmonary effort is normal.     Breath sounds: Normal breath sounds. No wheezing, rhonchi or rales.  Abdominal:     General: Abdomen is flat. Bowel sounds are normal. There is no distension.     Palpations: Abdomen is soft.      Tenderness: There is no abdominal tenderness.   Musculoskeletal:        General: No swelling or deformity. Normal range of motion.     Cervical back: Normal range of motion.   Skin:    General: Skin is warm and dry.     Capillary Refill: Capillary refill takes less than 2 seconds.   Neurological:     General: No focal deficit present.     Mental Status: He is alert and oriented to person, place, and time.     Motor: No weakness.     Comments: Action tremor present in bilateral upper extremities, most prominent in the left hand.  Psychiatric:        Mood and Affect: Mood normal.        Behavior: Behavior normal.        Thought Content: Thought content normal.   Last CBC Lab Results  Component Value Date   WBC 10.2 06/27/2023   HGB 17.2 (H) 06/27/2023   HCT 48.7 06/27/2023   MCV 89.2 06/27/2023   MCH 31.5 06/27/2023   RDW 14.0 06/27/2023   PLT 174 06/27/2023   Last metabolic panel Lab Results  Component Value Date   GLUCOSE 351 (H) 06/27/2023   NA 137 06/27/2023   K 4.2 06/27/2023   CL 101 06/27/2023   CO2 24 06/27/2023   BUN 24 (H) 06/27/2023   CREATININE 1.12 06/27/2023   GFRNONAA >60 06/27/2023   CALCIUM 9.3 06/27/2023   PROT 6.7 01/28/2023   ALBUMIN 4.2 01/28/2023   LABGLOB 2.5 01/28/2023   AGRATIO 1.8 03/07/2022   BILITOT 0.4 01/28/2023   ALKPHOS 103 01/28/2023   AST 49 (H) 01/28/2023   ALT 58 (H) 01/28/2023   ANIONGAP 12 06/27/2023   Last lipids Lab Results  Component Value Date   CHOL 172 10/09/2023   HDL 25 (L) 10/09/2023   LDLCALC 86 10/09/2023   TRIG 307 (H) 10/09/2023   CHOLHDL 6.9 10/09/2023   Last hemoglobin A1c Lab Results  Component Value Date   HGBA1C 8.7 (H) 07/04/2023   Last thyroid  functions Lab Results  Component Value Date   TSH 3.130 05/07/2023   Last vitamin B12 and Folate Lab Results  Component Value Date   VITAMINB12 556 03/07/2022   FOLATE >20.0 03/07/2022   The 10-year ASCVD risk score (Arnett DK, et al., 2019) is:  19.2%    Assessment & Plan:   Problem List Items  Addressed This Visit       Type 2 diabetes mellitus with hyperglycemia (HCC)   A1c 8.7 on labs from January.  He is currently prescribed metformin  XR 1000 mg twice daily and Victoza  1.8 mg daily.  POC A1c today has improved to 6.7.  He was congratulated on his progress.  No medication changes are indicated at this time.      Essential tremor   His additional concern today is worsening essential tremor.  He was last evaluated by neurology in May 2024 and is currently prescribed propranolol  60 mg daily.  Tremor has recently worsened and he would like to discuss additional treatment options. -Add primidone  50 mg 4 times daily.  Continue propranolol  as currently prescribed.  I recommended following up with neurology if symptoms do not improve.      Anxiety and depression - Primary   His acute concern today is increased irritability that is beginning to affect his marriage as well as relationships with his coworkers.  He has a documented history of anxiety and depression and has previously tried Celexa  and Prozac .  Both medications were discontinued due to sexual dysfunction.  He would like to discuss medication options for improved management of anxiety and depression but is also concerned about developing sexual dysfunction. -Treatment options reviewed.  Through shared decision making, Wellbutrin  XL 150 mg daily has been prescribed today.  Okay to increase to 300 mg daily after 4 days if ineffective.      Return in about 3 months (around 03/13/2024).   Tobi Fortes, MD

## 2023-12-12 NOTE — Assessment & Plan Note (Signed)
 Moderate OSA on CPAP.  Excellent compliance and control.  Receives benefit from use.  Aware of proper care/use of device.  Understands risks of untreated sleep apnea.  Healthy weight management encouraged.  Safe driving practices reviewed.  Patient Instructions  Continue to use CPAP every night, minimum of 4-6 hours a night.  Change equipment as directed. Wash your tubing with warm soap and water daily, hang to dry. Wash humidifier portion weekly. Use bottled, distilled water and change daily Be aware of reduced alertness and do not drive or operate heavy machinery if experiencing this or drowsiness.  Exercise encouraged, as tolerated. Healthy weight management discussed.  Avoid or decrease alcohol consumption and medications that make you more sleepy, if possible. Notify if persistent daytime sleepiness occurs even with consistent use of PAP therapy.  Look at these charity options for CPAP supplies... 1. [employer] - http://www.reggiewhitefoundation.org/  2. [employer] Link -  [employer].co.za  3. American [employer] Then, you have the Mask Program, which sends you up to 4 masks, 4 filters, and 2 tubes for the low, low price of $100. To qualify, you must submit an application, pay the corresponding program fee, and provide a CPAP prescription.  Link - TagCams.com.cy   You can also go to this website... [email or web address]://[email or web address]://www.[email or web address]/ Scroll down to eligibility and put your info in and they may be able to find more affordable options  Follow up in 1 year with Katie Jayln Branscom,NP, or sooner, if needed

## 2023-12-12 NOTE — Progress Notes (Signed)
 @Patient  ID: Jesse Castillo, male    DOB: [birth year], 58 y.o.   MRN: [id number]  Chief Complaint  Patient presents with   Follow-up    Referring provider: Tobi Fortes, MD  HPI: 58 year old male, former smoker followed for OSA on CPAP. He is a former Dr. Matilde Son patient. Past medical history significant for HTN, CHF, GERD, DM, tremor, seasonal allergies, obesity, HLD, ED, depression.  TEST/EVENTS:  07/27/2020 HST: AHI 20, SpO2 low 86%  02/13/2023: OV with Dr. Matilde Son.  OSA on CPAP.  Uses CPAP nightly.  Sleeping well.  Feels rested.  Has DOT license.  New CPAP 13 cmH2O encouraged healthy weight management.  12/12/2023: Today-follow-up Patient presents today for follow-up.  Doing well since his last visit regarding his CPAP use.  Sleeps with it every night.  Feels like he sleeps well with it and energy levels are better during the day.  Does have some occasional leaks but is able to adjust his mask and fix these.  They do not bother him.  Has not been able to get updated supplies.  His insurance will not cover his CPAP supplies so he has to pay for out-of-pocket.  Wants to know if there are any options to help him with this.  11/11/2023-12/10/2023: CPAP 13 cmH2O 30/30 days; 100% >4 hr; average use 8 hours 21 minutes Leaks 95th 34.6 AHI 0.8  Allergies  Allergen Reactions   Mobic  [Meloxicam ] Other (See Comments)    Mouth sores    Immunization History  Administered Date(s) Administered   Influenza,inj,Quad PF,6+ Mos 07/30/2019, 04/18/2021, 04/23/2023   Influenza,inj,Quad PF,6-35 Mos 04/02/2022   Influenza-Unspecified 04/15/2020   PFIZER(Purple Top)SARS-COV-2 Vaccination 10/09/2019, 10/30/2019, 05/24/2021   Tdap 10/03/2018   Zoster Recombinant(Shingrix ) 07/24/2021, 11/01/2021    Past Medical History:  Diagnosis Date   Enlarged prostate    GERD (gastroesophageal reflux disease)    Hypertension    Neuropathy    RLE   Pneumonia due to COVID-19 virus 07/2020   Restless leg     Sleep apnea    Tremor of left hand     Tobacco History: Social History   Tobacco Use  Smoking Status Former   Current packs/day: 0.00   Average packs/day: 3.0 packs/day for 18.0 years (54.0 ttl pk-yrs)   Types: Cigarettes   Start date: 07/02/1989   Quit date: 07/03/2007   Years since quitting: 16.4  Smokeless Tobacco Never   Counseling given: Not Answered   Outpatient Medications Prior to Visit  Medication Sig Dispense Refill   amLODipine  (NORVASC ) 5 MG tablet TAKE ONE (1) TABLET BY MOUTH EVERY DAY 90 tablet 3   aspirin EC 81 MG tablet Take 81 mg by mouth daily.     buPROPion  (WELLBUTRIN  XL) 150 MG 24 hr tablet Take 1 tablet (150 mg total) by mouth daily. 30 tablet 2   cyclobenzaprine  (FLEXERIL ) 10 MG tablet Take 1 tablet (10 mg total) by mouth 3 (three) times daily as needed (pain, muscle spasms). 60 tablet 2   famotidine  (PEPCID ) 40 MG tablet TAKE ONE TABLET BY MOUTH EVERY NIGHT AT BEDTIME 90 tablet 0   fluticasone  (FLONASE ) 50 MCG/ACT nasal spray Place 2 sprays into both nostrils daily. 16 g 6   furosemide  (LASIX ) 80 MG tablet TAKE ONE TABLET (80MG  TOTAL) BY MOUTH TWO TIMES DAILY 180 tablet 3   gabapentin  (NEURONTIN ) 600 MG tablet TAKE ONE TABLET (600MG  TOTAL) BY MOUTH THREE TIMES DAILY 90 tablet 2   HYDROcodone -acetaminophen  (NORCO) 5-325 MG tablet Take  1 tablet by mouth every 6 (six) hours as needed for moderate pain. 6 tablet 0   hydrOXYzine  (VISTARIL ) 25 MG capsule Take 1 capsule (25 mg total) by mouth every 8 (eight) hours as needed. 30 capsule 0   icosapent  Ethyl (VASCEPA ) 1 g capsule Take 2 capsules (2 g total) by mouth 2 (two) times daily. 360 capsule 3   Insulin Pen Needle (PEN NEEDLES) 32G X 4 MM MISC 1 each by Does not apply route daily. 100 each 3   liraglutide  (VICTOZA ) 18 MG/3ML SOPN INJECT 1.8 MG INTO THE SKIN DAILY 9 mL 0   metFORMIN  (GLUCOPHAGE -XR) 500 MG 24 hr tablet TAKE 2 TABLETS (1000 MG TOTAL) BY MOUTH 2 (TWO) TIMES DAILY WITH A MEAL 120 tablet 2   Misc  Natural Products (URINOZINC PLUS PO) Take by mouth.     montelukast  (SINGULAIR ) 10 MG tablet TAKE ONE TABLET (10MG  TOTAL) BY MOUTH ATBEDTIME 90 tablet 3   Multiple Vitamins-Minerals (MULTIVITAMIN MEN PO) Take by mouth.     NEEDLE, DISP, 23 G (BD DISP NEEDLE) 23G X 1 MISC Use every 14 days with testosterone  50 each 1   ondansetron  (ZOFRAN ) 4 MG tablet Take 1 tablet (4 mg total) by mouth every 6 (six) hours. 12 tablet 0   pantoprazole  (PROTONIX ) 40 MG tablet Take 1 tablet (40 mg total) by mouth 2 (two) times daily. 180 tablet 0   potassium chloride  SA (KLOR-CON  M) 20 MEQ tablet TAKE ONE TABLET ( TOTAL) BY MOUTH DAILY 90 tablet 3   primidone (MYSOLINE) 50 MG tablet Take 1 tablet (50 mg total) by mouth 4 (four) times daily. 120 tablet 1   Probiotic Product (HEALTHY COLON PO) Take by mouth.     propranolol  ER (INDERAL  LA) 60 MG 24 hr capsule TAKE ONE CAPSULE (60MG  TOTAL) BY MOUTH DAILY 30 capsule 5   rOPINIRole  (REQUIP ) 4 MG tablet TAKE ONE TABLET (4MG  TOTAL) BY MOUTH AT BEDTIME 90 tablet 3   sildenafil  (VIAGRA ) 100 MG tablet Take 1 tablet (100 mg total) by mouth daily as needed. 30 tablet 11   Specialty Vitamins Products (ECHINACEA C COMPLETE PO) Take by mouth.     SYRINGE-NEEDLE, DISP, 3 ML (B-D 3CC LUER-LOK SYR 23GX1) 23G X 1 3 ML MISC USE EVERY 14 DAYS WITH TESTOSTERONE  50 each 0   Syringe/Needle, Disp, 18G X 1 3 ML MISC 1 Device by Does not apply route every 14 (fourteen) days. 12 each 1   tadalafil  (CIALIS ) 10 MG tablet Take 10 mg by mouth daily as needed for erectile dysfunction.     tadalafil  (CIALIS ) 5 MG tablet Take 1 tablet (5 mg total) by mouth daily as needed for erectile dysfunction. 30 tablet 11   testosterone  cypionate (DEPOTESTOSTERONE CYPIONATE) 200 MG/ML injection Inject 0.5 mLs (100 mg total) into the muscle every 7 (seven) days. 10 mL 1   traZODone  (DESYREL ) 150 MG tablet Take 1 tablet (150 mg total) by mouth at bedtime. 90 tablet 1   diclofenac  (VOLTAREN ) 75 MG EC tablet  TAKE ONE TABLET (75MG  TOTAL) BY MOUTH TWO TIMES DAILY 180 tablet 0   No facility-administered medications prior to visit.     Review of Systems:   Constitutional: No weight loss or gain, night sweats, fevers, chills, fatigue, or lassitude. HEENT: No headaches, difficulty swallowing, tooth/dental problems, or sore throat. No sneezing, itching, ear ache, nasal congestion, or post nasal drip CV:  No chest pain, orthopnea, PND, swelling in lower extremities, anasarca, dizziness, palpitations, syncope Resp: +  Stable shortness of breath with exertion No excess mucus or change in color of mucus. No productive or non-productive. No hemoptysis. No wheezing.  No chest wall deformity GI:  No heartburn, indigestion GU: No nocturia MSK:  + Chronic joint pains Neuro: No dizziness or lightheadedness.  Psych: No depression or anxiety. Mood stable.     Physical Exam:  BP 131/85 (BP Location: Left Arm)   Pulse 94   Ht 5' 11 (1.803 m)   Wt (!) 327 lb (148.3 kg)   SpO2 96%   BMI 45.61 kg/m   GEN: Pleasant, interactive, well-kempt; obese; in no acute distress HEENT:  Normocephalic and atraumatic. PERRLA. Sclera white. Nasal turbinates pink, moist and patent bilaterally. No rhinorrhea present. Oropharynx pink and moist, without exudate or edema. No lesions, ulcerations, or postnasal drip. Mallampati III NECK:  Supple w/ fair ROM. Thyroid  symmetrical with no goiter or nodules palpated. No lymphadenopathy.   CV: RRR, no m/r/g, no peripheral edema. Pulses intact, +2 bilaterally. No cyanosis, pallor or clubbing. PULMONARY:  Unlabored, regular breathing. Clear bilaterally A&P w/o wheezes/rales/rhonchi. No accessory muscle use.  GI: BS present and normoactive. Soft, non-tender to palpation.  MSK: No erythema, warmth or tenderness. Cap refil <2 sec all extrem.  Neuro: A/Ox3. No focal deficits noted.   Skin: Warm, no lesions or rashe Psych: Normal affect and behavior. Judgement and thought content  appropriate.     Lab Results:  CBC    Component Value Date/Time   WBC 10.2 06/27/2023 1405   RBC 5.46 06/27/2023 1405   HGB 17.2 (H) 06/27/2023 1405   HGB 16.3 06/13/2023 0847   HCT 48.7 06/27/2023 1405   HCT 47.5 06/13/2023 0847   PLT 174 06/27/2023 1405   PLT 198 01/28/2023 0955   MCV 89.2 06/27/2023 1405   MCV 92 01/28/2023 0955   MCH 31.5 06/27/2023 1405   MCHC 35.3 06/27/2023 1405   RDW 14.0 06/27/2023 1405   RDW 13.1 01/28/2023 0955   LYMPHSABS 0.4 (L) 06/27/2023 1405   LYMPHSABS 1.6 01/28/2023 0955   MONOABS 1.0 06/27/2023 1405   EOSABS 0.0 06/27/2023 1405   EOSABS 0.1 01/28/2023 0955   BASOSABS 0.0 06/27/2023 1405   BASOSABS 0.0 01/28/2023 0955    BMET    Component Value Date/Time   NA 137 06/27/2023 1405   NA 141 01/28/2023 0955   K 4.2 06/27/2023 1405   CL 101 06/27/2023 1405   CO2 24 06/27/2023 1405   GLUCOSE 351 (H) 06/27/2023 1405   BUN 24 (H) 06/27/2023 1405   BUN 19 01/28/2023 0955   CREATININE 1.12 06/27/2023 1405   CALCIUM 9.3 06/27/2023 1405   GFRNONAA >60 06/27/2023 1405   GFRAA 99 05/12/2020 0837    BNP No results found for: BNP   Imaging:  XR C-ARM NO REPORT Result Date: 12/03/2023 Please see Notes tab for imaging impression.  Epidural Steroid injection Result Date: 12/03/2023 Bridget Campion, MD     12/05/2023  6:27 AM Lumbar Epidural Steroid Injection - Interlaminar Approach with Fluoroscopic Guidance Patient: BARDIA WANGERIN     Date of Birth: [birth year] MRN: [id number] PCP: Tobi Fortes, MD     Visit Date: 12/03/2023  Universal Protocol:   Consent Given By: the patient Position: PRONE Additional Comments: Vital signs were monitored before and after the procedure. Patient was prepped and draped in the usual sterile fashion. The correct patient, procedure, and site was verified. Injection Procedure Details: Procedure diagnoses: Lumbar radiculopathy [M54.16] Meds Administered: Meds ordered this encounter  Medications  methylPREDNISolone   acetate (DEPO-MEDROL ) injection 40 mg  Laterality: Right Location/Site:  L2-3 Needle: 3.5 in., 20 ga. Tuohy Needle Placement: Paramedian epidural Findings:  -Comments: We had moderate difficulty with obtaining good loss-of-resistance and entry into the epidural space.  There were multiple changes of angles to get a more lateral approach and better imaging.  Ultimately felt like we succeeded from a field standpoint and then contrasted show epidural spread.  Consider transforaminal approach. Procedure Details: Using a paramedian approach from the side mentioned above, the region overlying the inferior lamina was localized under fluoroscopic visualization and the soft tissues overlying this structure were infiltrated with 4 ml. of 1% Lidocaine without Epinephrine. The Tuohy needle was inserted into the epidural space using a paramedian approach. The epidural space was localized using loss of resistance along with counter oblique bi-planar fluoroscopic views.  After negative aspirate for air, blood, and CSF, a 2 ml. volume of Isovue -250 was injected into the epidural space and the flow of contrast was observed. Radiographs were obtained for documentation purposes.  The injectate was administered into the level noted above. Additional Comments: The patient tolerated the procedure well Dressing: 2 x 2 sterile gauze and Band-Aid  Post-procedure details: Patient was observed during the procedure. Post-procedure instructions were reviewed. Patient left the clinic in stable condition.   methylPREDNISolone  acetate (DEPO-MEDROL ) injection 40 mg     Date Action Dose Route User   12/03/2023 1421 Given 40 mg Other Bridget Campion, MD          Latest Ref Rng & Units 09/04/2022    1:22 PM  PFT Results  FVC-Pre L 4.14   FVC-Predicted Pre % 78   FVC-Post L 4.10   FVC-Predicted Post % 77   Pre FEV1/FVC % % 86   Post FEV1/FCV % % 87   FEV1-Pre L 3.58   FEV1-Predicted Pre % 88   FEV1-Post L 3.57   DLCO uncorrected  ml/min/mmHg 27.50   DLCO UNC% % 90   DLCO corrected ml/min/mmHg 25.90   DLCO COR %Predicted % 84   DLVA Predicted % 103   TLC L 6.54   TLC % Predicted % 88   RV % Predicted % 100     No results found for: NITRICOXIDE      Assessment & Plan:   OSA on CPAP Moderate OSA on CPAP.  Excellent compliance and control.  Receives benefit from use.  Aware of proper care/use of device.  Understands risks of untreated sleep apnea.  Healthy weight management encouraged.  Safe driving practices reviewed.  Patient Instructions  Continue to use CPAP every night, minimum of 4-6 hours a night.  Change equipment as directed. Wash your tubing with warm soap and water daily, hang to dry. Wash humidifier portion weekly. Use bottled, distilled water and change daily Be aware of reduced alertness and do not drive or operate heavy machinery if experiencing this or drowsiness.  Exercise encouraged, as tolerated. Healthy weight management discussed.  Avoid or decrease alcohol consumption and medications that make you more sleepy, if possible. Notify if persistent daytime sleepiness occurs even with consistent use of PAP therapy.  Look at these charity options for CPAP supplies... 1. [employer] - http://www.reggiewhitefoundation.org/  2. [employer] CPAP Link -  RecruitSuit.co.za  3. [employer] Then, you have the Mask Program, which sends you up to 4 masks, 4 filters, and 2 tubes for the low, low price of $100. To qualify, you must submit an  application, pay the corresponding program fee, and provide a CPAP prescription.  Link - TagCams.com.cy   You can also go to this website... https://shop.https://www.webster.com/ Scroll down to eligibility and put your info in and they may be able to find more affordable options  Follow up in 1 year with  Katie Jesson Foskey,NP, or sooner, if needed    Advised if symptoms do not improve or worsen, to please contact office for sooner follow up or seek emergency care.   I spent 25 minutes of dedicated to the care of this patient on the date of this encounter to include pre-visit review of records, face-to-face time with the patient discussing conditions above, post visit ordering of testing, clinical documentation with the electronic health record, making appropriate referrals as documented, and communicating necessary findings to members of the patients care team.  Roetta Clarke, NP 12/12/2023  Pt aware and understands NP's role.

## 2023-12-13 DIAGNOSIS — F32A Depression, unspecified: Secondary | ICD-10-CM | POA: Insufficient documentation

## 2023-12-13 NOTE — Assessment & Plan Note (Signed)
 A1c 8.7 on labs from January.  He is currently prescribed metformin  XR 1000 mg twice daily and Victoza  1.8 mg daily.  POC A1c today has improved to 6.7.  He was congratulated on his progress.  No medication changes are indicated at this time.

## 2023-12-13 NOTE — Assessment & Plan Note (Signed)
 His additional concern today is worsening essential tremor.  He was last evaluated by neurology in May 2024 and is currently prescribed propranolol  60 mg daily.  Tremor has recently worsened and he would like to discuss additional treatment options. -Add primidone  50 mg 4 times daily.  Continue propranolol  as currently prescribed.  I recommended following up with neurology if symptoms do not improve.

## 2023-12-13 NOTE — Assessment & Plan Note (Signed)
 His acute concern today is increased irritability that is beginning to affect his marriage as well as relationships with his coworkers.  He has a documented history of anxiety and depression and has previously tried Celexa  and Prozac .  Both medications were discontinued due to sexual dysfunction.  He would like to discuss medication options for improved management of anxiety and depression but is also concerned about developing sexual dysfunction. -Treatment options reviewed.  Through shared decision making, Wellbutrin  XL 150 mg daily has been prescribed today.  Okay to increase to 300 mg daily after 4 days if ineffective.

## 2023-12-14 ENCOUNTER — Ambulatory Visit: Payer: Self-pay | Admitting: Internal Medicine

## 2023-12-14 LAB — BAYER DCA HB A1C WAIVED: HB A1C (BAYER DCA - WAIVED): 6.7 % — ABNORMAL HIGH (ref 4.8–5.6)

## 2023-12-17 ENCOUNTER — Other Ambulatory Visit: Payer: 59

## 2023-12-17 DIAGNOSIS — E291 Testicular hypofunction: Secondary | ICD-10-CM

## 2023-12-17 DIAGNOSIS — D751 Secondary polycythemia: Secondary | ICD-10-CM

## 2023-12-18 ENCOUNTER — Other Ambulatory Visit: Payer: Self-pay | Admitting: Urology

## 2023-12-18 ENCOUNTER — Encounter: Payer: Self-pay | Admitting: Orthopaedic Surgery

## 2023-12-18 ENCOUNTER — Ambulatory Visit: Payer: Self-pay

## 2023-12-18 DIAGNOSIS — R7989 Other specified abnormal findings of blood chemistry: Secondary | ICD-10-CM

## 2023-12-18 DIAGNOSIS — D751 Secondary polycythemia: Secondary | ICD-10-CM

## 2023-12-18 LAB — TESTOSTERONE: Testosterone: 483 ng/dL (ref 264–916)

## 2023-12-18 LAB — HEMOGLOBIN AND HEMATOCRIT, BLOOD
Hematocrit: 49.5 % (ref 37.5–51.0)
Hemoglobin: 16.3 g/dL (ref 13.0–17.7)

## 2023-12-26 ENCOUNTER — Ambulatory Visit: Payer: 59 | Admitting: Urology

## 2023-12-26 ENCOUNTER — Encounter: Payer: Self-pay | Admitting: Urology

## 2023-12-26 VITALS — BP 130/82 | HR 85

## 2023-12-26 DIAGNOSIS — R3915 Urgency of urination: Secondary | ICD-10-CM

## 2023-12-26 DIAGNOSIS — N529 Male erectile dysfunction, unspecified: Secondary | ICD-10-CM

## 2023-12-26 DIAGNOSIS — N401 Enlarged prostate with lower urinary tract symptoms: Secondary | ICD-10-CM

## 2023-12-26 DIAGNOSIS — E291 Testicular hypofunction: Secondary | ICD-10-CM | POA: Diagnosis not present

## 2023-12-26 LAB — URINALYSIS, ROUTINE W REFLEX MICROSCOPIC
Bilirubin, UA: NEGATIVE
Glucose, UA: NEGATIVE
Ketones, UA: NEGATIVE
Leukocytes,UA: NEGATIVE
Nitrite, UA: NEGATIVE
Protein,UA: NEGATIVE
RBC, UA: NEGATIVE
Specific Gravity, UA: 1.01 (ref 1.005–1.030)
Urobilinogen, Ur: 0.2 mg/dL (ref 0.2–1.0)
pH, UA: 6.5 (ref 5.0–7.5)

## 2023-12-26 MED ORDER — TADALAFIL 10 MG PO TABS: 10.0000 mg | ORAL_TABLET | Freq: Every day | ORAL | 11 refills | Status: AC | PRN

## 2023-12-26 MED ORDER — VARDENAFIL HCL 20 MG PO TABS: 20.0000 mg | ORAL_TABLET | Freq: Every day | ORAL | 11 refills | Status: AC | PRN

## 2023-12-26 NOTE — Progress Notes (Signed)
 Subjective: 1. Hypogonadism in male   2. Erectile dysfunction, unspecified erectile dysfunction type   3. BPH with urinary obstruction   4. Urgency of urination     12/26/23: Jesse Castillo returns today in f/u.  He remains on TRT and his 22 with a Hgb of 16.3.  He is on sildenafil  and tadalafil  with a partial response but difficulty maintaining.  He can't reach a climax.  He has tried Corporate investment banker without success.  His IPSS is 12 with urgency and frequency on furosemide .  He has lost 20lb on victoza .  He has had no hematuria.    06/20/23: Jesse Castillo returns today in f/u.  He was in the ER on 04/11/23 for some genital pain and a history of recent gross hematuria.  He had been treated with bactrim  for prostatitis and had no further bleeding.  He had a CT hematuria study in October and apart from prostate enlargement, there were no GU findings.  His UA had lg blood on 03/19/23 but was clear on 04/11/23.  He had labs on 06/13/23 and his Hgb was 16.3 down from 17.4 on 04/11/23 but his T was only 52 on 06/13/23.  His PSA was 0.5.  He had a scrotal US  on 04/11/23 that showed bilateral epididymal cysts and spermatoceles.  He has a dull right tesicular ache with sitting.  He has persistent ED with inability to maintain despite combination oral therapy.    07/12/22: Jesse Castillo returns today in f/u.  He hasn't donated blood as requested.  He is scheduled by Dr. Shellia to see hematology on 08/02/22. He has not had his testosterone  injection in 2 weeks.  His Hgb on 1/3 was up to 18.5 with a T of 457.  He remains on tadalafil  but went to 10mg  qod because of supply issues.  He uses the sildenafil  100mg  prn as well.   His IPSS is 11.  He has nocturia x 1.  He reports a slow stream but is primarily reporting post void dribbling.  He has had no hematuria.    GU hx: Jesse Castillo is a 58 yo male who is sent for a history of hypogonadism that is being managed with Testosterone  cypionate 100mg  q1wk.  His level is 751 at a peak.  His Hgb is stab1e at 17.4 and he  has been donating blood regularly.  He has improved energy with the med.  His PSA was 0.8 on 02/08/21.  He has moderate LUTS with an IPSS of 9.  He can have some hesitancy but his IPSS is 8 with nocturia x 2.  His PSA is 0.6.  He has no prior stones, UTI's or GU surgery.   He has ED and is on sildenafil  and  tadalafil  with an improved response but he had decreased sensation difficulty with reaching a climax but that is better off of the Prozac . He doesn't have any problems with the injections.  He still has some issues with irritability. UA is clear.   IPSS     Row Name 12/26/23 1000         International Prostate Symptom Score   How often have you had the sensation of not emptying your bladder? Less than 1 in 5     How often have you had to urinate less than every two hours? About half the time     How often have you found you stopped and started again several times when you urinated? Not at All     How often have you found  it difficult to postpone urination? Almost always     How often have you had a weak urinary stream? Less than 1 in 5 times     How often have you had to strain to start urination? Less than 1 in 5 times     How many times did you typically get up at night to urinate? 1 Time     Total IPSS Score 12       Quality of Life due to urinary symptoms   If you were to spend the rest of your life with your urinary condition just the way it is now how would you feel about that? Mixed         ROS:  Review of Systems  Constitutional:  Positive for weight loss.  HENT:  Positive for congestion.   Respiratory:  Positive for cough and shortness of breath.   Musculoskeletal:  Positive for back pain.  Endo/Heme/Allergies:  Positive for polydipsia.  Psychiatric/Behavioral:  Positive for depression. The patient is nervous/anxious.   All other systems reviewed and are negative.   Allergies  Allergen Reactions   Mobic  [Meloxicam ] Other (See Comments)    Mouth sores    Past  Medical History:  Diagnosis Date   Enlarged prostate    GERD (gastroesophageal reflux disease)    Hypertension    Neuropathy    RLE   Pneumonia due to COVID-19 virus 07/2020   Restless leg    Sleep apnea    Tremor of left hand     Past Surgical History:  Procedure Laterality Date   HERNIA REPAIR      Social History   Socioeconomic History   Marital status: Married    Spouse name: Not on file   Number of children: Not on file   Years of education: Not on file   Highest education level: 12th grade  Occupational History   Occupation: Dentist work for [employer]  Tobacco Use   Smoking status: Former    Current packs/day: 0.00    Average packs/day: 3.0 packs/day for 18.0 years (54.0 ttl pk-yrs)    Types: Cigarettes    Start date: 07/02/1989    Quit date: 07/03/2007    Years since quitting: 16.4   Smokeless tobacco: Never  Vaping Use   Vaping status: Never Used  Substance and Sexual Activity   Alcohol use: Never   Drug use: Never   Sexual activity: Yes  Other Topics Concern   Not on file  Social History Narrative   Not on file   Social Drivers of Health   Financial Resource Strain: Low Risk  (09/19/2022)   Overall Financial Resource Strain (CARDIA)    Difficulty of Paying Living Expenses: Not very hard  Food Insecurity: Food Insecurity Present (09/19/2022)   Hunger Vital Sign    Worried About Running Out of Food in the Last Year: Sometimes true    Ran Out of Food in the Last Year: Sometimes true  Transportation Needs: No Transportation Needs (09/19/2022)   PRAPARE - Administrator, Civil Service (Medical): No    Lack of Transportation (Non-Medical): No  Physical Activity: Insufficiently Active (09/19/2022)   Exercise Vital Sign    Days of Exercise per Week: 5 days    Minutes of Exercise per Session: 10 min  Stress: Stress Concern Present (09/19/2022)   [name] of Occupational Health - Occupational Stress Questionnaire    Feeling of Stress : To  some extent  Social Connections: Moderately Integrated (  09/19/2022)   Social Connection and Isolation Panel    Frequency of Communication with Friends and Family: More than three times a week    Frequency of Social Gatherings with Friends and Family: Once a week    Attends Religious Services: 1 to 4 times per year    Active Member of [employer] or Organizations: No    Attends Engineer, structural: Not on file    Marital Status: Married  Catering manager Violence: Not At Risk (08/02/2022)   Humiliation, Afraid, Rape, and Kick questionnaire    Fear of Current or Ex-Partner: No    Emotionally Abused: No    Physically Abused: No    Sexually Abused: No    Family History  Problem Relation Age of Onset   Heart disease Mother    Tremor Father    Hypertension Daughter    Heart disease Maternal Grandmother    Parkinson's disease Paternal Grandmother     Anti-infectives: Anti-infectives (From admission, onward)    None       Current Outpatient Medications  Medication Sig Dispense Refill   amLODipine  (NORVASC ) 5 MG tablet TAKE ONE (1) TABLET BY MOUTH EVERY DAY 90 tablet 3   aspirin EC 81 MG tablet Take 81 mg by mouth daily.     buPROPion  (WELLBUTRIN  XL) 150 MG 24 hr tablet Take 1 tablet (150 mg total) by mouth daily. 30 tablet 2   cyclobenzaprine  (FLEXERIL ) 10 MG tablet Take 1 tablet (10 mg total) by mouth 3 (three) times daily as needed (pain, muscle spasms). 60 tablet 2   diclofenac  (VOLTAREN ) 75 MG EC tablet TAKE ONE TABLET (75MG  TOTAL) BY MOUTH TWO TIMES DAILY 180 tablet 0   famotidine  (PEPCID ) 40 MG tablet TAKE ONE TABLET BY MOUTH EVERY NIGHT AT BEDTIME 90 tablet 0   fluticasone  (FLONASE ) 50 MCG/ACT nasal spray Place 2 sprays into both nostrils daily. 16 g 6   furosemide  (LASIX ) 80 MG tablet TAKE ONE TABLET (80MG  TOTAL) BY MOUTH TWO TIMES DAILY 180 tablet 3   gabapentin  (NEURONTIN ) 600 MG tablet TAKE ONE TABLET (600MG  TOTAL) BY MOUTH THREE TIMES DAILY 90 tablet 2    HYDROcodone -acetaminophen  (NORCO) 5-325 MG tablet Take 1 tablet by mouth every 6 (six) hours as needed for moderate pain. 6 tablet 0   hydrOXYzine  (VISTARIL ) 25 MG capsule Take 1 capsule (25 mg total) by mouth every 8 (eight) hours as needed. 30 capsule 0   icosapent  Ethyl (VASCEPA ) 1 g capsule Take 2 capsules (2 g total) by mouth 2 (two) times daily. 360 capsule 3   Insulin Pen Needle (PEN NEEDLES) 32G X 4 MM MISC 1 each by Does not apply route daily. 100 each 3   liraglutide  (VICTOZA ) 18 MG/3ML SOPN INJECT 1.8 MG INTO THE SKIN DAILY 9 mL 0   metFORMIN  (GLUCOPHAGE -XR) 500 MG 24 hr tablet TAKE 2 TABLETS (1000 MG TOTAL) BY MOUTH 2 (TWO) TIMES DAILY WITH A MEAL 120 tablet 2   Misc Natural Products (URINOZINC PLUS PO) Take by mouth.     montelukast  (SINGULAIR ) 10 MG tablet TAKE ONE TABLET (10MG  TOTAL) BY MOUTH ATBEDTIME 90 tablet 3   Multiple Vitamins-Minerals (MULTIVITAMIN MEN PO) Take by mouth.     NEEDLE, DISP, 23 G (BD DISP NEEDLE) 23G X 1 MISC Use every 14 days with testosterone  50 each 1   ondansetron  (ZOFRAN ) 4 MG tablet Take 1 tablet (4 mg total) by mouth every 6 (six) hours. 12 tablet 0   pantoprazole  (PROTONIX ) 40 MG tablet  Take 1 tablet (40 mg total) by mouth 2 (two) times daily. 180 tablet 0   potassium chloride  SA (KLOR-CON  M) 20 MEQ tablet TAKE ONE TABLET ( TOTAL) BY MOUTH DAILY 90 tablet 3   primidone  (MYSOLINE ) 50 MG tablet Take 1 tablet (50 mg total) by mouth 4 (four) times daily. 120 tablet 1   Probiotic Product (HEALTHY COLON PO) Take by mouth.     propranolol  ER (INDERAL  LA) 60 MG 24 hr capsule TAKE ONE CAPSULE (60MG  TOTAL) BY MOUTH DAILY 30 capsule 5   rOPINIRole  (REQUIP ) 4 MG tablet TAKE ONE TABLET (4MG  TOTAL) BY MOUTH AT BEDTIME 90 tablet 3   sildenafil  (VIAGRA ) 100 MG tablet Take 1 tablet (100 mg total) by mouth daily as needed. 30 tablet 11   Specialty Vitamins Products (ECHINACEA C COMPLETE PO) Take by mouth.     SYRINGE-NEEDLE, DISP, 3 ML (B-D 3CC LUER-LOK SYR 23GX1)  23G X 1 3 ML MISC USE EVERY 14 DAYS WITH TESTOSTERONE  50 each 0   Syringe/Needle, Disp, 18G X 1 3 ML MISC 1 Device by Does not apply route every 14 (fourteen) days. 12 each 1   tadalafil  (CIALIS ) 5 MG tablet Take 1 tablet (5 mg total) by mouth daily as needed for erectile dysfunction. 30 tablet 11   testosterone  cypionate (DEPOTESTOSTERONE CYPIONATE) 200 MG/ML injection INJECT 0.5 ML (100 MG TOTAL) INTO THE MUSCLE EVERY 7 (SEVEN) DAYS 10 mL 1   traZODone  (DESYREL ) 150 MG tablet Take 1 tablet (150 mg total) by mouth at bedtime. 90 tablet 1   vardenafil  (LEVITRA ) 20 MG tablet Take 1 tablet (20 mg total) by mouth daily as needed for erectile dysfunction. 10 tablet 11   tadalafil  (CIALIS ) 10 MG tablet Take 1 tablet (10 mg total) by mouth daily as needed for erectile dysfunction. 30 tablet 11   No current facility-administered medications for this visit.     Objective: Vital signs in last 24 hours: BP 130/82   Pulse 85   Intake/Output from previous day: No intake/output data recorded. Intake/Output this shift: @IOTHISSHIFT @   Physical Exam Vitals reviewed.  Constitutional:      Appearance: Normal appearance. He is obese.   Neurological:     Mental Status: He is alert.     Lab Results:  Recent Results (from the past 2160 hours)  Lipid panel     Status: Abnormal   Collection Time: 10/09/23  8:47 AM  Result Value Ref Range   Cholesterol 172 0 - 200 mg/dL   Triglycerides 692 (H) <150 mg/dL   HDL 25 (L) >59 mg/dL   Total CHOL/HDL Ratio 6.9 RATIO   VLDL 61 (H) 0 - 40 mg/dL   LDL Cholesterol 86 0 - 99 mg/dL    Comment:        Total Cholesterol/HDL:CHD Risk Coronary Heart Disease Risk Table                     Men   Women  1/2 Average Risk   3.4   3.3  Average Risk       5.0   4.4  2 X Average Risk   9.6   7.1  3 X Average Risk  23.4   11.0        Use the calculated Patient Ratio above and the CHD Risk Table to determine the patient's CHD Risk.        ATP III  CLASSIFICATION (LDL):  <100     mg/dL  Optimal  100-129  mg/dL   Near or Above                    Optimal  130-159  mg/dL   Borderline  839-810  mg/dL   High  >809     mg/dL   Very High Performed at [facility], [address] [place], [place] [address]   [facility] Hb A1c Waived     Status: Abnormal   Collection Time: 12/12/23  2:16 PM  Result Value Ref Range   HB A1C (BAYER DCA - WAIVED) 6.7 (H) 4.8 - 5.6 %    Comment:          Prediabetes: 5.7 - 6.4          Diabetes: >6.4          Glycemic control for adults with diabetes: <7.0   Testosterone      Status: None   Collection Time: 12/17/23 10:13 AM  Result Value Ref Range   Testosterone  483 264 - 916 ng/dL    Comment: Adult male reference interval is based on a population of healthy nonobese males (BMI <30) between 92 and 75 years old. Travison, et.al. JCEM [phone]. PMID: [id number].   Hemoglobin and hematocrit, blood     Status: None   Collection Time: 12/17/23 10:13 AM  Result Value Ref Range   Hemoglobin 16.3 13.0 - 17.7 g/dL   Hematocrit 50.4 62.4 - 51.0 %  Urinalysis, Routine w reflex microscopic     Status: None   Collection Time: 12/26/23  9:54 AM  Result Value Ref Range   Specific Gravity, UA 1.010 1.005 - 1.030   pH, UA 6.5 5.0 - 7.5   Color, UA Yellow Yellow   Appearance Ur Clear Clear   Leukocytes,UA Negative Negative   Protein,UA Negative Negative/Trace   Glucose, UA Negative Negative   Ketones, UA Negative Negative   RBC, UA Negative Negative   Bilirubin, UA Negative Negative   Urobilinogen, Ur 0.2 0.2 - 1.0 mg/dL   Nitrite, UA Negative Negative   Microscopic Examination Comment     Comment: Microscopic not indicated and not performed.     BMET No results for input(s): NA, K, CL, CO2, GLUCOSE, BUN, CREATININE, CALCIUM in the last 72 hours. PT/INR No results for input(s): LABPROT, INR in the last 72 hours. ABG No results for input(s): PHART, HCO3 in the last 72  hours.  Invalid input(s): PCO2, PO2  Studies/Results: No results found. XR C-ARM NO REPORT Result Date: 12/03/2023 Please see Notes tab for imaging impression.  Epidural Steroid injection Result Date: 12/03/2023 Eldonna Novel, MD     12/05/2023  6:27 AM Lumbar Epidural Steroid Injection - Interlaminar Approach with Fluoroscopic Guidance Patient: Jesse Castillo     Date of Birth: [birth year] MRN: [id number] PCP: Melvenia Manus BRAVO, MD     Visit Date: 12/03/2023  Universal Protocol:   Consent Given By: the patient Position: PRONE Additional Comments: Vital signs were monitored before and after the procedure. Patient was prepped and draped in the usual sterile fashion. The correct patient, procedure, and site was verified. Injection Procedure Details: Procedure diagnoses: Lumbar radiculopathy [M54.16] Meds Administered: Meds ordered this encounter Medications  methylPREDNISolone  acetate (DEPO-MEDROL ) injection 40 mg  Laterality: Right Location/Site:  L2-3 Needle: 3.5 in., 20 ga. Tuohy Needle Placement: Paramedian epidural Findings:  -Comments: We had moderate difficulty with obtaining good loss-of-resistance and entry into the epidural space.  There were multiple changes of angles to get a  more lateral approach and better imaging.  Ultimately felt like we succeeded from a field standpoint and then contrasted show epidural spread.  Consider transforaminal approach. Procedure Details: Using a paramedian approach from the side mentioned above, the region overlying the inferior lamina was localized under fluoroscopic visualization and the soft tissues overlying this structure were infiltrated with 4 ml. of 1% Lidocaine without Epinephrine. The Tuohy needle was inserted into the epidural space using a paramedian approach. The epidural space was localized using loss of resistance along with counter oblique bi-planar fluoroscopic views.  After negative aspirate for air, blood, and CSF, a 2 ml. volume of Isovue -250 was  injected into the epidural space and the flow of contrast was observed. Radiographs were obtained for documentation purposes.  The injectate was administered into the level noted above. Additional Comments: The patient tolerated the procedure well Dressing: 2 x 2 sterile gauze and Band-Aid  Post-procedure details: Patient was observed during the procedure. Post-procedure instructions were reviewed. Patient left the clinic in stable condition.  DG MYELOGRAPHY LUMBAR INJ LUMBOSACRAL Result Date: 11/05/2023 CLINICAL DATA:  Low back and right hip pain. EXAM: LUMBAR MYELOGRAM FLUOROSCOPY: Radiation Exposure Index (as provided by the fluoroscopic device): 29.50 mGy Kerma PROCEDURE: After thorough discussion of risks and benefits of the procedure including bleeding, infection, injury to nerves, blood vessels, adjacent structures as well as headache and CSF leak, written and oral informed consent was obtained. Consent was obtained by Dr. Dasie Hamburg. Time out form was completed. Patient was positioned prone on the fluoroscopy table. Local anesthesia was provided with 1% lidocaine without epinephrine after prepped and draped in the usual sterile fashion. Lumbar puncture was initially attempted under fluoroscopic guidance using a 5 inch 22 gauge spinal needle via a left interlaminar approach at L5-S1, however there was no CSF return despite appropriate needle positioning by AP and lateral fluoroscopy and reverse Trendelenburg positioning. The needle was removed, and a lumbar puncture was subsequently successfully performed via a left interlaminar approach at L3-4 with return of clear, colorless CSF. 15 mL of Isovue  M-200 was injected into the thecal sac, with normal opacification of the nerve roots and cauda equina consistent with free flow within the subarachnoid space. I personally performed the lumbar puncture and administered the intrathecal contrast. I also personally supervised acquisition of the myelogram images.  TECHNIQUE: Contiguous axial images were obtained through the Lumbar spine after the intrathecal infusion of contrast. Coronal and sagittal reconstructions were obtained of the axial image sets. COMPARISON:  Lumbar spine radiographs 10/16/2023 FINDINGS: LUMBAR MYELOGRAM FINDINGS: There are 5 non-rib-bearing lumbar type vertebrae. Vertebral alignment is normal. There is no evidence of dynamic instability on flexion or extension radiographs. Small ventral extradural defects are present at L2-3, L3-4, and L4-5 resulting in up to moderate spinal stenosis, greatest with standing. CT LUMBAR MYELOGRAM FINDINGS: Vertebral alignment is normal. No fracture is identified. Multiple Schmorl's nodes are present, most prominently involving the L3 superior endplate. There is diffuse congenital narrowing of the lumbar spinal canal due to short pedicles. The conus medullaris terminates at L1-2. The right kidney is mildly low lying. Hepatic steatosis is noted. T12-L1: Disc bulging, a left paracentral to subarticular disc protrusion, and mild facet and ligamentum flavum hypertrophy result in mild spinal stenosis and moderate left lateral recess stenosis without neural foraminal stenosis. L1-2: Minimal disc bulging and mild facet hypertrophy without significant stenosis. L2-3: Circumferential disc bulging and moderate facet and ligamentum flavum hypertrophy result in moderate spinal stenosis, mild bilateral lateral recess stenosis, and  mild bilateral neural foraminal stenosis. L3-4: Circumferential disc bulging, a right foraminal to extraforaminal disc osteophyte complex, and moderate facet and ligamentum flavum hypertrophy result in mild spinal stenosis, mild-to-moderate right and mild left lateral recess stenosis, and moderate right and mild left neural foraminal stenosis. L4-5: Disc bulging, ligamentum flavum hypertrophy, and markedly severe right and moderate to severe left facet arthrosis result in mild-to-moderate spinal stenosis,  mild-to-moderate bilateral lateral recess stenosis, and moderate to severe right and mild-to-moderate left neural foraminal stenosis. There are right-sided facet erosions. L5-S1: Disc bulging, endplate spurring, and mild right and moderate left facet arthrosis result in mild left greater than right lateral recess stenosis and mild-to-moderate bilateral neural foraminal stenosis without spinal stenosis. IMPRESSION: 1. 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 2. Moderate spinal stenosis at L2-3, L3-4, and L4-5 with standing. 3. Markedly severe right facet arthrosis at L4-5 with moderate to severe right neural foraminal stenosis. 4. Moderate right neural foraminal stenosis at L3-4. 5. Mild-to-moderate bilateral neural foraminal stenosis at L5-S1. Electronically Signed   By: Dasie Hamburg M.D.   On: 11/05/2023 14:34   CT Lumbar Spine W Contrast Result Date: 11/05/2023 CLINICAL DATA:  Low back and right hip pain. EXAM: LUMBAR MYELOGRAM FLUOROSCOPY: Radiation Exposure Index (as provided by the fluoroscopic device): 29.50 mGy Kerma PROCEDURE: After thorough discussion of risks and benefits of the procedure including bleeding, infection, injury to nerves, blood vessels, adjacent structures as well as headache and CSF leak, written and oral informed consent was obtained. Consent was obtained by Dr. Dasie Hamburg. Time out form was completed. Patient was positioned prone on the fluoroscopy table. Local anesthesia was provided with 1% lidocaine without epinephrine after prepped and draped in the usual sterile fashion. Lumbar puncture was initially attempted under fluoroscopic guidance using a 5 inch 22 gauge spinal needle via a left interlaminar approach at L5-S1, however there was no CSF return despite appropriate needle positioning by AP and lateral fluoroscopy and reverse Trendelenburg positioning. The needle was removed, and a lumbar puncture was subsequently successfully  performed via a left interlaminar approach at L3-4 with return of clear, colorless CSF. 15 mL of Isovue  M-200 was injected into the thecal sac, with normal opacification of the nerve roots and cauda equina consistent with free flow within the subarachnoid space. I personally performed the lumbar puncture and administered the intrathecal contrast. I also personally supervised acquisition of the myelogram images. TECHNIQUE: Contiguous axial images were obtained through the Lumbar spine after the intrathecal infusion of contrast. Coronal and sagittal reconstructions were obtained of the axial image sets. COMPARISON:  Lumbar spine radiographs 10/16/2023 FINDINGS: LUMBAR MYELOGRAM FINDINGS: There are 5 non-rib-bearing lumbar type vertebrae. Vertebral alignment is normal. There is no evidence of dynamic instability on flexion or extension radiographs. Small ventral extradural defects are present at L2-3, L3-4, and L4-5 resulting in up to moderate spinal stenosis, greatest with standing. CT LUMBAR MYELOGRAM FINDINGS: Vertebral alignment is normal. No fracture is identified. Multiple Schmorl's nodes are present, most prominently involving the L3 superior endplate. There is diffuse congenital narrowing of the lumbar spinal canal due to short pedicles. The conus medullaris terminates at L1-2. The right kidney is mildly low lying. Hepatic steatosis is noted. T12-L1: Disc bulging, a left paracentral to subarticular disc protrusion, and mild facet and ligamentum flavum hypertrophy result in mild spinal stenosis and moderate left lateral recess stenosis without neural foraminal stenosis. L1-2: Minimal disc bulging and mild facet hypertrophy without significant stenosis. L2-3: Circumferential disc bulging  and moderate facet and ligamentum flavum hypertrophy result in moderate spinal stenosis, mild bilateral lateral recess stenosis, and mild bilateral neural foraminal stenosis. L3-4: Circumferential disc bulging, a right foraminal  to extraforaminal disc osteophyte complex, and moderate facet and ligamentum flavum hypertrophy result in mild spinal stenosis, mild-to-moderate right and mild left lateral recess stenosis, and moderate right and mild left neural foraminal stenosis. L4-5: Disc bulging, ligamentum flavum hypertrophy, and markedly severe right and moderate to severe left facet arthrosis result in mild-to-moderate spinal stenosis, mild-to-moderate bilateral lateral recess stenosis, and moderate to severe right and mild-to-moderate left neural foraminal stenosis. There are right-sided facet erosions. L5-S1: Disc bulging, endplate spurring, and mild right and moderate left facet arthrosis result in mild left greater than right lateral recess stenosis and mild-to-moderate bilateral neural foraminal stenosis without spinal stenosis. IMPRESSION: 1. 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 2. Moderate spinal stenosis at L2-3, L3-4, and L4-5 with standing. 3. Markedly severe right facet arthrosis at L4-5 with moderate to severe right neural foraminal stenosis. 4. Moderate right neural foraminal stenosis at L3-4. 5. Mild-to-moderate bilateral neural foraminal stenosis at L5-S1. Electronically Signed   By: Dasie Hamburg M.D.   On: 11/05/2023 14:34   XR Lumbar Spine 2-3 Views Result Date: 10/16/2023 2 views of the lumbar spine show no acute findings.  There is no significant malalignment.  There are bridging osteophytes at the lower thoracic area.   Assessment/Plan: Hypogonadism.  His T is is normal back on therapy.  Med was refilled on 12/18/23.  Polycythemia.  His Hgb is down to 16.3.      ED.  He is  partially responding to the combination of daily tadalafil  and sildenafil  prn but has reduced sensation and delayed ejaculation. His scripts refilled and I sent a script for Vardenafil  for him to try in a triple drug combination of tadalafil  5mg , sildenafil  50mg  and vardenafil  10mg  (1/2 of a  20mg ) as needed.  I discussed IPP again as that would be next option for him since he failed the addition of Eroxon and the use of a VED.  He is not interested in injection.   BPH with urgency. Continue  the tadalafil . PSA on return.   Right scrotal pain.  He is not reporting that today.     Meds ordered this encounter  Medications   tadalafil  (CIALIS ) 10 MG tablet    Sig: Take 1 tablet (10 mg total) by mouth daily as needed for erectile dysfunction.    Dispense:  30 tablet    Refill:  11   vardenafil  (LEVITRA ) 20 MG tablet    Sig: Take 1 tablet (20 mg total) by mouth daily as needed for erectile dysfunction.    Dispense:  10 tablet    Refill:  11      Orders Placed This Encounter  Procedures   Urinalysis, Routine w reflex microscopic   Testosterone     Standing Status:   Future    Expected Date:   06/26/2024    Expiration Date:   12/25/2024   Hemoglobin and hematocrit, blood    Standing Status:   Future    Expected Date:   06/26/2024    Expiration Date:   12/25/2024   PSA, total and free    Standing Status:   Future    Expected Date:   06/26/2024    Expiration Date:   12/25/2024     Return in about 6 months (around 06/26/2024) for any available MD.  CC: Niki Rung FNP     Norleen Seltzer 12/27/2023 [phone] Patient ID: Arley JINNY Pattee, male   DOB: 1966/03/15, 58 y.o.   MRN: [id number]

## 2024-01-01 ENCOUNTER — Telehealth: Payer: Self-pay | Admitting: Physical Medicine and Rehabilitation

## 2024-01-01 NOTE — Telephone Encounter (Signed)
 Patient called and said he has to work that day 7/10 so can he reschedule. CB#[phone]

## 2024-01-08 ENCOUNTER — Other Ambulatory Visit: Payer: Self-pay | Admitting: Internal Medicine

## 2024-01-08 DIAGNOSIS — E1165 Type 2 diabetes mellitus with hyperglycemia: Secondary | ICD-10-CM

## 2024-01-09 ENCOUNTER — Ambulatory Visit: Admitting: Physical Medicine and Rehabilitation

## 2024-01-13 ENCOUNTER — Other Ambulatory Visit: Payer: Self-pay | Admitting: Adult Health

## 2024-01-15 NOTE — Telephone Encounter (Signed)
Last seen on 11/13/22  No follow up scheduled

## 2024-01-31 ENCOUNTER — Ambulatory Visit: Admitting: Nurse Practitioner

## 2024-02-06 ENCOUNTER — Other Ambulatory Visit: Payer: Self-pay

## 2024-02-06 DIAGNOSIS — E1165 Type 2 diabetes mellitus with hyperglycemia: Secondary | ICD-10-CM

## 2024-02-13 ENCOUNTER — Other Ambulatory Visit: Payer: Self-pay | Admitting: Internal Medicine

## 2024-02-13 DIAGNOSIS — G4701 Insomnia due to medical condition: Secondary | ICD-10-CM

## 2024-02-13 DIAGNOSIS — K219 Gastro-esophageal reflux disease without esophagitis: Secondary | ICD-10-CM

## 2024-02-13 DIAGNOSIS — G629 Polyneuropathy, unspecified: Secondary | ICD-10-CM

## 2024-02-13 DIAGNOSIS — E119 Type 2 diabetes mellitus without complications: Secondary | ICD-10-CM

## 2024-02-21 ENCOUNTER — Other Ambulatory Visit: Payer: Self-pay

## 2024-02-21 DIAGNOSIS — K219 Gastro-esophageal reflux disease without esophagitis: Secondary | ICD-10-CM

## 2024-02-21 MED ORDER — PANTOPRAZOLE SODIUM 40 MG PO TBEC: 40.0000 mg | DELAYED_RELEASE_TABLET | Freq: Two times a day (BID) | ORAL | 3 refills | Status: AC

## 2024-02-24 ENCOUNTER — Telehealth: Payer: Self-pay | Admitting: Pharmacy Technician

## 2024-02-24 ENCOUNTER — Other Ambulatory Visit (HOSPITAL_COMMUNITY): Payer: Self-pay

## 2024-02-24 NOTE — Telephone Encounter (Signed)
 Pharmacy Patient Advocate Encounter   Received notification from CoverMyMeds that prior authorization for Pantoprazole  Sodium 40MG  dr tablets is required/requested.   Insurance verification completed.   The patient is insured through [facility] .   Per test claim: PA required; PA submitted to above mentioned insurance via Latent Key/confirmation #/EOC [id number] Status is pending

## 2024-02-24 NOTE — Telephone Encounter (Signed)
 Pharmacy Patient Advocate Encounter  Received notification from [facility] that Prior Authorization for Pantoprazole  Sodium 40MG  dr tablets has been APPROVED from 02/24/2024 to 02/23/2025. Ran test claim, Copay is $8.12. This test claim was processed through [facility]- copay amounts may vary at other pharmacies due to pharmacy/plan contracts, or as the patient moves through the different stages of their insurance plan.   PA #/Case ID/Reference #: [id number]

## 2024-03-10 ENCOUNTER — Other Ambulatory Visit: Payer: Self-pay | Admitting: Orthopaedic Surgery

## 2024-03-10 ENCOUNTER — Other Ambulatory Visit: Payer: Self-pay | Admitting: Internal Medicine

## 2024-03-10 DIAGNOSIS — G252 Other specified forms of tremor: Secondary | ICD-10-CM

## 2024-03-10 DIAGNOSIS — F32A Depression, unspecified: Secondary | ICD-10-CM

## 2024-03-13 ENCOUNTER — Ambulatory Visit

## 2024-03-13 VITALS — BP 127/85 | HR 99 | Ht 71.0 in | Wt 318.1 lb

## 2024-03-13 DIAGNOSIS — B351 Tinea unguium: Secondary | ICD-10-CM

## 2024-03-13 DIAGNOSIS — E119 Type 2 diabetes mellitus without complications: Secondary | ICD-10-CM | POA: Diagnosis not present

## 2024-03-13 NOTE — Progress Notes (Signed)
 Established Patient Office Visit  Subjective   Patient ID: Jesse Castillo, male    DOB: 1966/04/24  Age: 58 y.o. MRN: [id number]  Chief Complaint  Patient presents with   Medical Management of Chronic Issues    Pt here for follow up, pt also has a black spot on toe that he wants checked out     HPI Discussed the use of AI scribe software for clinical note transcription with the patient, who gave verbal consent to proceed.  History of Present Illness   Jesse Castillo is a 58 year old male who presents for follow-up regarding a black spot under his big toe and to check his A1c. He is accompanied by his wife, [name].  Subungual pigmentation - Black spot present under the big toe - Discoloration of the toenail obscures assessment of the underlying lesion  Diabetes mellitus management - Here for A1c monitoring as part of ongoing diabetes management - No recent changes in diabetes management regimen - No symptoms of hyperglycemia or hypoglycemia  Lower extremity edema - Swelling in the left leg, more pronounced than the right - Left leg has consistently been larger and more swollen than the right - Attributes swelling to varicose veins - Previously used compression socks but found them uncomfortable and difficult to keep in place  Right hand tremor and motor symptoms - Twitches in the right hand - Episodes of dropping objects - Previously evaluated by a neurologist via video visit - Propranolol  slow release was prescribed for tremor but subsequently discontinued - Uncertain about current neurologist's status and seeking clarification  Preventive care needs - Has not yet received influenza vaccination, prefers to wait longer - Over one year since last eye examination and needs to schedule an eye check      Patient Active Problem List   Diagnosis Date Noted   Type 2 diabetes mellitus without complication, without long-term current use of insulin (HCC) 03/17/2024   Onychomycosis  03/17/2024   Anxiety and depression 12/13/2023   Chronic right hip pain 10/09/2023   Hypertriglyceridemia 06/21/2023   Type 2 diabetes mellitus with hyperglycemia (HCC) 05/07/2023   Abnormal TSH 05/07/2023   Screening for hyperlipidemia 01/30/2023   Prediabetes 01/30/2023   Restrictive lung disease secondary to obesity 12/14/2022   Congestive heart failure (CHF) (HCC) 10/31/2022   Erythrocytosis 09/19/2022   Mixed hyperlipidemia 09/19/2022   Generalized anxiety disorder 03/07/2022   Depression, recurrent (HCC) 03/07/2022   Insomnia due to medical condition 03/07/2022   Essential tremor 04/24/2021   Seasonal allergies 04/24/2021   Umbilical hernia without obstruction and without gangrene 04/24/2021   Erectile dysfunction 08/04/2020   Gastroesophageal reflux disease 12/21/2019   Essential hypertension 09/20/2019   Arthritis 09/20/2019   Morbid obesity (HCC) 09/20/2019   Low testosterone  in male 09/18/2019   Enlarged prostate    Neuropathy    Restless leg    OSA on CPAP     ROS    Objective:     BP 127/85   Pulse 99   Ht 5' 11 (1.803 m)   Wt (!) 318 lb 1.9 oz (144.3 kg)   SpO2 97%   BMI 44.37 kg/m  BP Readings from Last 3 Encounters:  03/13/24 127/85  12/26/23 130/82  12/12/23 131/85   Wt Readings from Last 3 Encounters:  03/13/24 (!) 318 lb 1.9 oz (144.3 kg)  12/12/23 (!) 327 lb (148.3 kg)  12/12/23 (!) 327 lb 9.6 oz (148.6 kg)     Physical Exam Vitals  and nursing note reviewed. Exam conducted with a chaperone present (wife is with him).  Constitutional:      Appearance: Normal appearance. He is obese.  HENT:     Head: Normocephalic.  Eyes:     Extraocular Movements: Extraocular movements intact.     Pupils: Pupils are equal, round, and reactive to light.  Cardiovascular:     Rate and Rhythm: Normal rate and regular rhythm.  Pulmonary:     Effort: Pulmonary effort is normal.     Breath sounds: Normal breath sounds.  Musculoskeletal:     Cervical  back: Normal range of motion and neck supple.     Right lower leg: Normal.     Left lower leg: 2+ Edema present.  Feet:     Right foot:     Skin integrity: Skin integrity normal.     Toenail Condition: Right toenails are abnormally thick. Fungal disease present.    Left foot:     Skin integrity: Skin integrity normal.     Toenail Condition: Left toenails are normal.  Neurological:     Mental Status: He is alert and oriented to person, place, and time.  Psychiatric:        Mood and Affect: Mood normal.        Thought Content: Thought content normal.     Results for orders placed or performed in visit on 03/13/24  Bayer DCA Hb A1c Waived  Result Value Ref Range   HB A1C (BAYER DCA - WAIVED) 6.0 (H) 4.8 - 5.6 %    The 10-year ASCVD risk score (Arnett DK, et al., 2019) is: 18.2%    Assessment & Plan:   Problem List Items Addressed This Visit       Endocrine   Type 2 diabetes mellitus without complication, without long-term current use of insulin (HCC) - Primary   POC A1C was 6.0 in office today.  Continue with current medications.  Recommend follow-up in 3 months or sooner if needed.      Relevant Orders   Bayer DCA Hb A1c Waived (Completed)   Ambulatory referral to Podiatry     Musculoskeletal and Integument   Onychomycosis   Onychomycosis of the right great toe Discoloration and black spot under right great toenail. Further evaluation needed. - Refer to podiatrist for evaluation of the right great toe. - Advise to check with insurance for podiatry coverage.      Relevant Orders   Ambulatory referral to Podiatry       No follow-ups on file.    Leita Longs, FNP

## 2024-03-16 LAB — BAYER DCA HB A1C WAIVED: HB A1C (BAYER DCA - WAIVED): 6 % — ABNORMAL HIGH (ref 4.8–5.6)

## 2024-03-17 ENCOUNTER — Ambulatory Visit: Admitting: Physical Medicine and Rehabilitation

## 2024-03-17 ENCOUNTER — Encounter: Payer: Self-pay | Admitting: Physical Medicine and Rehabilitation

## 2024-03-17 DIAGNOSIS — M5416 Radiculopathy, lumbar region: Secondary | ICD-10-CM

## 2024-03-17 DIAGNOSIS — M48062 Spinal stenosis, lumbar region with neurogenic claudication: Secondary | ICD-10-CM | POA: Diagnosis not present

## 2024-03-17 DIAGNOSIS — G8929 Other chronic pain: Secondary | ICD-10-CM | POA: Diagnosis not present

## 2024-03-17 DIAGNOSIS — B351 Tinea unguium: Secondary | ICD-10-CM | POA: Insufficient documentation

## 2024-03-17 DIAGNOSIS — E119 Type 2 diabetes mellitus without complications: Secondary | ICD-10-CM | POA: Insufficient documentation

## 2024-03-17 DIAGNOSIS — M5441 Lumbago with sciatica, right side: Secondary | ICD-10-CM | POA: Diagnosis not present

## 2024-03-17 NOTE — Assessment & Plan Note (Signed)
 Onychomycosis of the right great toe Discoloration and black spot under right great toenail. Further evaluation needed. - Refer to podiatrist for evaluation of the right great toe. - Advise to check with insurance for podiatry coverage.

## 2024-03-17 NOTE — Progress Notes (Signed)
 Pain Scale   Average Pain 5 Patient advising he has chronic lower back pain radiating to right hip, injections have helped in the past ( 12/03/2023)        +Driver, -BT, -Dye Allergies.

## 2024-03-17 NOTE — Progress Notes (Signed)
 [name] - 58 y.o. male MRN [id number]  Date of birth: May 01, 1966  Office Visit Note: Visit Date: 03/17/2024 PCP: Bevely Doffing, FNP Referred by: Bevely Doffing, FNP  Subjective: Chief Complaint  Patient presents with   Lower Back - Pain   HPI: Jesse Castillo is a 58 y.o. male who comes in today for evaluation of chronic, worsening and severe right sided lower back pain radiating to lateral hip, buttock and posterior thigh. Pain ongoing for several years, worsens with prolonged standing and walking. He describes pain as sore and aching sensation, currently rates as 8 out of 10. Also reports numbness/tingling to legs. Sitting seems to alleviate his discomfort. States he often has to lean over grocery cart at the store. Some relief of pain with home exercise regimen, rest and use of medications. No history of formal physical therapy. Recent CT lumbar myelogram shows congenitally narrow lumbar spinal canal, there is moderate spinal canal stenosis at L2-L3, L3-L4 and L4-L5 with standing. Markedly severe right facet arthrosis at L4-5 with moderate to severe right neural foraminal stenosis. He underwent right L2-L3 interlaminar epidural steroid injection in our office on 12/03/2023. Per Dr. Lyda injection notes it was difficult to enter epidural space at the level of L2-L3, likely due to narrow central canal. Ultimately, he felt injection was successful, however would recommend trying transforaminal approach. Patient denies focal weakness. No recent trauma or falls.      Review of Systems  Musculoskeletal:  Positive for back pain.  Neurological:  Positive for tingling. Negative for focal weakness and weakness.  All other systems reviewed and are negative.  Otherwise per HPI.  Assessment & Plan: Visit Diagnoses:    ICD-10-CM   1. Chronic right-sided low back pain with right-sided sciatica  M54.41 Ambulatory referral to Physical Medicine Rehab   G89.29     2. Lumbar radiculopathy   M54.16 Ambulatory referral to Physical Medicine Rehab    3. Spinal stenosis of lumbar region with neurogenic claudication  M48.062 Ambulatory referral to Physical Medicine Rehab       Plan: Findings:  Chronic, worsening and severe right sided lower back pain radiating to lateral hip, buttock and posterior thigh. Patient continues to have severe pain despite good conservative therapies such as home exercise regimen, rest and use of medications. Patients clinical presentation and exam are consistent with neurogenic claudication as a result of spinal canal stenosis. We discussed treatment plan in detail today. Next step is to perform diagnostic and hopefully therapeutic right L2 transforaminal epidural steroid injection under fluoroscopic guidance. If good relief of pain with injection we can repeat this procedure infrequently as needed. We had a long discussion regarding purpose of injections and management of chronic pain. Other treatment options would include re-grouping with physical therapy, surgical intervention and chronic pain management. Would consider follow up with Dr. Georgina in the future. I understand his BMI will need to be lower to be considered for surgical intervention. No red flag symptoms noted upon exam today.     Meds & Orders: No orders of the defined types were placed in this encounter.   Orders Placed This Encounter  Procedures   Ambulatory referral to Physical Medicine Rehab    Follow-up: Return for Right L2 transforaminal epidural steroid injection.   Procedures: No procedures performed      Clinical History: CT LUMBAR MYELOGRAM FINDINGS:   Vertebral alignment is normal. No fracture is identified. Multiple Schmorl's nodes are present, most prominently involving the L3 superior endplate.  There is diffuse congenital narrowing of the lumbar spinal canal due to short pedicles. The conus medullaris terminates at L1-2. The right kidney is mildly low lying.  Hepatic steatosis is noted.   T12-L1: Disc bulging, a left paracentral to subarticular disc protrusion, and mild facet and ligamentum flavum hypertrophy result in mild spinal stenosis and moderate left lateral recess stenosis without neural foraminal stenosis.   L1-2: Minimal disc bulging and mild facet hypertrophy without significant stenosis.   L2-3: Circumferential disc bulging and moderate facet and ligamentum flavum hypertrophy result in moderate spinal stenosis, mild bilateral lateral recess stenosis, and mild bilateral neural foraminal stenosis.   L3-4: Circumferential disc bulging, a right foraminal to extraforaminal disc osteophyte complex, and moderate facet and ligamentum flavum hypertrophy result in mild spinal stenosis, mild-to-moderate right and mild left lateral recess stenosis, and moderate right and mild left neural foraminal stenosis.   L4-5: Disc bulging, ligamentum flavum hypertrophy, and markedly severe right and moderate to severe left facet arthrosis result in mild-to-moderate spinal stenosis, mild-to-moderate bilateral lateral recess stenosis, and moderate to severe right and mild-to-moderate left neural foraminal stenosis. There are right-sided facet erosions.   L5-S1: Disc bulging, endplate spurring, and mild right and moderate left facet arthrosis result in mild left greater than right lateral recess stenosis and mild-to-moderate bilateral neural foraminal stenosis without spinal stenosis.   IMPRESSION: 1. 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 2. Moderate spinal stenosis at L2-3, L3-4, and L4-5 with standing. 3. Markedly severe right facet arthrosis at L4-5 with moderate to severe right neural foraminal stenosis. 4. Moderate right neural foraminal stenosis at L3-4. 5. Mild-to-moderate bilateral neural foraminal stenosis at L5-S1.     Electronically Signed   By: Dasie Hamburg M.D.   On: 11/05/2023  14:34   He reports that he quit smoking about 16 years ago. His smoking use included cigarettes. He started smoking about 34 years ago. He has a 54 pack-year smoking history. He has never used smokeless tobacco.  Recent Labs    07/04/23 1008 12/12/23 1416 03/13/24 1408  HGBA1C 8.7* 6.7* 6.0*    Objective:  VS:  HT:    WT:   BMI:     BP:   HR: bpm  TEMP: ( )  RESP:  Physical Exam Vitals and nursing note reviewed.  HENT:     Head: Normocephalic and atraumatic.     Right Ear: External ear normal.     Left Ear: External ear normal.     Nose: Nose normal.     Mouth/Throat:     Mouth: Mucous membranes are moist.  Eyes:     Extraocular Movements: Extraocular movements intact.  Cardiovascular:     Rate and Rhythm: Normal rate.     Pulses: Normal pulses.  Pulmonary:     Effort: Pulmonary effort is normal.  Abdominal:     General: Abdomen is flat. There is no distension.  Musculoskeletal:        General: Tenderness present.     Cervical back: Normal range of motion.     Comments: Patient is slow to rise from seated position to standing. Good lumbar range of motion. No pain noted with facet loading. 5/5 strength noted with bilateral hip flexion, knee flexion/extension, ankle dorsiflexion/plantarflexion and EHL. No clonus noted bilaterally. No pain upon palpation of greater trochanters. No pain with internal/external rotation of bilateral hips. Sensation intact bilaterally. Negative slump test bilaterally. Ambulates without aid, gait steady.     Skin:  General: Skin is warm and dry.     Capillary Refill: Capillary refill takes less than 2 seconds.  Neurological:     General: No focal deficit present.     Mental Status: He is alert and oriented to person, place, and time.  Psychiatric:        Mood and Affect: Mood normal.        Behavior: Behavior normal.     Ortho Exam  Imaging: No results found.  Past Medical/Family/Surgical/Social History: Medications & Allergies  reviewed per EMR, new medications updated. 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/14/2022   Congestive heart failure (CHF) (HCC) 10/31/2022   Erythrocytosis 09/19/2022   Mixed hyperlipidemia 09/19/2022   Generalized anxiety disorder 03/07/2022   Depression, recurrent 03/07/2022   Insomnia due to medical condition 03/07/2022   Essential tremor 04/24/2021   Seasonal allergies 04/24/2021   Umbilical hernia without obstruction and without gangrene 04/24/2021   Erectile dysfunction 08/04/2020   Gastroesophageal reflux disease 12/21/2019   Essential hypertension 09/20/2019   Arthritis 09/20/2019   Morbid obesity (HCC) 09/20/2019   Low testosterone  in male 09/18/2019   Enlarged prostate    Neuropathy    Restless leg    OSA on CPAP    Past Medical History:  Diagnosis Date   Enlarged prostate    GERD (gastroesophageal reflux disease)    Hypertension    Neuropathy    RLE   Pneumonia due to COVID-19 virus 07/2020   Restless leg    Sleep apnea    Tremor of left hand    Family History  Problem Relation Age of Onset   Heart disease Mother    Tremor Father    Hypertension Daughter    Heart disease Maternal Grandmother    Parkinson's disease Paternal Grandmother    Past Surgical History:  Procedure Laterality Date   HERNIA REPAIR     Social History   Occupational History   Occupation: Dentist work for [employer]  Tobacco Use   Smoking status: Former    Current packs/day: 0.00    Average packs/day: 3.0 packs/day for 18.0  years (54.0 ttl pk-yrs)    Types: Cigarettes    Start date: 07/02/1989    Quit date: 07/03/2007    Years since quitting: 16.7   Smokeless tobacco: Never  Vaping Use   Vaping status: Never Used  Substance and Sexual Activity   Alcohol use: Never   Drug use: Never   Sexual activity: Yes

## 2024-03-17 NOTE — Progress Notes (Signed)
 Core Outcome Measures Index (COMI) Back Score  Average Pain 5  COMI Score 50 %

## 2024-03-17 NOTE — Assessment & Plan Note (Signed)
 POC A1C was 6.0 in office today.  Continue with current medications.  Recommend follow-up in 3 months or sooner if needed.

## 2024-03-28 ENCOUNTER — Other Ambulatory Visit: Payer: Self-pay

## 2024-03-28 DIAGNOSIS — E1165 Type 2 diabetes mellitus with hyperglycemia: Secondary | ICD-10-CM

## 2024-03-31 ENCOUNTER — Encounter: Payer: Self-pay | Admitting: Podiatry

## 2024-03-31 ENCOUNTER — Ambulatory Visit (INDEPENDENT_AMBULATORY_CARE_PROVIDER_SITE_OTHER): Admitting: Podiatry

## 2024-03-31 DIAGNOSIS — M79676 Pain in unspecified toe(s): Secondary | ICD-10-CM

## 2024-03-31 DIAGNOSIS — E1142 Type 2 diabetes mellitus with diabetic polyneuropathy: Secondary | ICD-10-CM | POA: Diagnosis not present

## 2024-03-31 DIAGNOSIS — D2372 Other benign neoplasm of skin of left lower limb, including hip: Secondary | ICD-10-CM

## 2024-03-31 DIAGNOSIS — D2371 Other benign neoplasm of skin of right lower limb, including hip: Secondary | ICD-10-CM | POA: Diagnosis not present

## 2024-03-31 DIAGNOSIS — L603 Nail dystrophy: Secondary | ICD-10-CM

## 2024-03-31 DIAGNOSIS — B351 Tinea unguium: Secondary | ICD-10-CM | POA: Diagnosis not present

## 2024-03-31 NOTE — Progress Notes (Signed)
 Subjective:  Patient ID: Jesse Castillo, male    DOB: [birth year],  MRN: [id number] HPI Chief Complaint  Patient presents with   Debridement    Diabetic - last A1c was 6.0, requesting toenail and callus trim, concern for fungus   New Patient (Initial Visit)    58 y.o. male presents with the above complaint.   ROS: Denies fever chills nausea vomiting muscle aches pains calf pain back pain chest pain shortness of breath.  Past Medical History:  Diagnosis Date   Enlarged prostate    GERD (gastroesophageal reflux disease)    Hypertension    Neuropathy    RLE   Pneumonia due to COVID-19 virus 07/2020   Restless leg    Sleep apnea    Tremor of left hand    Past Surgical History:  Procedure Laterality Date   HERNIA REPAIR      Current Outpatient Medications:    amLODipine  (NORVASC ) 5 MG tablet, TAKE ONE (1) TABLET BY MOUTH EVERY DAY, Disp: 90 tablet, Rfl: 3   aspirin EC 81 MG tablet, Take 81 mg by mouth daily., Disp: , Rfl:    buPROPion  (WELLBUTRIN  XL) 150 MG 24 hr tablet, TAKE ONE TABLET (150 MG TOTAL) BY MOUTH DAILY., Disp: 30 tablet, Rfl: 2   cyclobenzaprine  (FLEXERIL ) 10 MG tablet, TAKE ONE TABLET (10MG  TOTAL) BY MOUTH THREE TIMES DAILY AS NEEDED FOR MUSCLE SPASMS, Disp: 60 tablet, Rfl: 2   diclofenac  (VOLTAREN ) 75 MG EC tablet, TAKE ONE TABLET (75MG  TOTAL) BY MOUTH TWO TIMES DAILY, Disp: 180 tablet, Rfl: 0   famotidine  (PEPCID ) 40 MG tablet, TAKE ONE TABLET BY MOUTH EVERY NIGHT AT BEDTIME, Disp: 90 tablet, Rfl: 0   fluticasone  (FLONASE ) 50 MCG/ACT nasal spray, Place 2 sprays into both nostrils daily., Disp: 16 g, Rfl: 6   furosemide  (LASIX ) 80 MG tablet, TAKE ONE TABLET (80MG  TOTAL) BY MOUTH TWO TIMES DAILY, Disp: 180 tablet, Rfl: 3   gabapentin  (NEURONTIN ) 600 MG tablet, TAKE ONE TABLET (600MG  TOTAL) BY MOUTH THREE TIMES DAILY, Disp: 90 tablet, Rfl: 2   HYDROcodone -acetaminophen  (NORCO) 5-325 MG tablet, Take 1 tablet by mouth every 6 (six) hours as needed for moderate pain.,  Disp: 6 tablet, Rfl: 0   hydrOXYzine  (VISTARIL ) 25 MG capsule, Take 1 capsule (25 mg total) by mouth every 8 (eight) hours as needed., Disp: 30 capsule, Rfl: 0   icosapent  Ethyl (VASCEPA ) 1 g capsule, Take 2 capsules (2 g total) by mouth 2 (two) times daily., Disp: 360 capsule, Rfl: 3   Insulin Pen Needle (PEN NEEDLES) 32G X 4 MM MISC, 1 each by Does not apply route daily., Disp: 100 each, Rfl: 3   liraglutide  (VICTOZA ) 18 MG/3ML SOPN, INJECT 1.8 MG  SUBCUTANEOUSLY ONCE DAILY, Disp: 9 mL, Rfl: 0   metFORMIN  (GLUCOPHAGE -XR) 500 MG 24 hr tablet, TAKE 2 TABLETS (1000 MG TOTAL) BY MOUTH 2 (TWO) TIMES DAILY WITH A MEAL, Disp: 120 tablet, Rfl: 2   Misc Natural Products (URINOZINC PLUS PO), Take by mouth., Disp: , Rfl:    montelukast  (SINGULAIR ) 10 MG tablet, TAKE ONE TABLET (10MG  TOTAL) BY MOUTH ATBEDTIME, Disp: 90 tablet, Rfl: 3   Multiple Vitamins-Minerals (MULTIVITAMIN MEN PO), Take by mouth., Disp: , Rfl:    NEEDLE, DISP, 23 G (BD DISP NEEDLE) 23G X 1 MISC, Use every 14 days with testosterone , Disp: 50 each, Rfl: 1   ondansetron  (ZOFRAN ) 4 MG tablet, Take 1 tablet (4 mg total) by mouth every 6 (six) hours., Disp: 12 tablet, Rfl:  0   pantoprazole  (PROTONIX ) 40 MG tablet, Take 1 tablet (40 mg total) by mouth 2 (two) times daily., Disp: 180 tablet, Rfl: 3   potassium chloride  SA (KLOR-CON  M) 20 MEQ tablet, TAKE ONE TABLET ( TOTAL) BY MOUTH DAILY, Disp: 90 tablet, Rfl: 3   primidone  (MYSOLINE ) 50 MG tablet, TAKE ONE TABLET (50MG  TOTAL) BY MOUTH FOUR TIMES DAILY., Disp: 120 tablet, Rfl: 1   Probiotic Product (HEALTHY COLON PO), Take by mouth., Disp: , Rfl:    propranolol  ER (INDERAL  LA) 60 MG 24 hr capsule, TAKE ONE CAPSULE (60MG  TOTAL) BY MOUTH DAILY, Disp: 30 capsule, Rfl: 5   rOPINIRole  (REQUIP ) 4 MG tablet, TAKE ONE TABLET (4MG  TOTAL) BY MOUTH AT BEDTIME, Disp: 90 tablet, Rfl: 3   sildenafil  (VIAGRA ) 100 MG tablet, Take 1 tablet (100 mg total) by mouth daily as needed., Disp: 30 tablet, Rfl: 11    Specialty Vitamins Products (ECHINACEA C COMPLETE PO), Take by mouth., Disp: , Rfl:    SYRINGE-NEEDLE, DISP, 3 ML (B-D 3CC LUER-LOK SYR 23GX1) 23G X 1 3 ML MISC, USE EVERY 14 DAYS WITH TESTOSTERONE , Disp: 50 each, Rfl: 0   Syringe/Needle, Disp, 18G X 1 3 ML MISC, 1 Device by Does not apply route every 14 (fourteen) days., Disp: 12 each, Rfl: 1   tadalafil  (CIALIS ) 10 MG tablet, Take 1 tablet (10 mg total) by mouth daily as needed for erectile dysfunction., Disp: 30 tablet, Rfl: 11   tadalafil  (CIALIS ) 5 MG tablet, Take 1 tablet (5 mg total) by mouth daily as needed for erectile dysfunction., Disp: 30 tablet, Rfl: 11   testosterone  cypionate (DEPOTESTOSTERONE CYPIONATE) 200 MG/ML injection, INJECT 0.5 ML (100 MG TOTAL) INTO THE MUSCLE EVERY 7 (SEVEN) DAYS, Disp: 10 mL, Rfl: 1   traZODone  (DESYREL ) 150 MG tablet, TAKE ONE TABLET (150MG  TOTAL) BY MOUTH AT BEDTIME, Disp: 90 tablet, Rfl: 1   vardenafil  (LEVITRA ) 20 MG tablet, Take 1 tablet (20 mg total) by mouth daily as needed for erectile dysfunction., Disp: 10 tablet, Rfl: 11  Allergies  Allergen Reactions   Mobic  Arion.Battles ] Other (See Comments)    Mouth sores   Review of Systems Objective:  There were no vitals filed for this visit.  General: Well developed, nourished, in no acute distress, alert and oriented x3   Dermatological: Skin is warm, dry and supple bilateral. Nails x thick yellow dystrophic clinically mycotic.  Remaining integument appears unremarkable at this time. There are no open sores, no preulcerative lesions, no rash or signs of infection present.  Multiple benign neoplastic skin growths plantar aspect of the forefoot bilateral.  Vascular: Dorsalis Pedis artery and Posterior Tibial artery pedal pulses are 2/4 bilateral with immedate capillary fill time. Pedal hair growth present. No varicosities and no lower extremity edema present bilateral.   Neruologic: Grossly intact via light touch bilateral. Vibratory intact via  tuning fork bilateral. Protective threshold with Semmes Wienstein monofilament diminished to all pedal sites bilateral. Patellar and Achilles deep tendon reflexes 2+ bilateral. No Babinski or clonus noted bilateral.   Musculoskeletal: No gross boney pedal deformities bilateral. No pain, crepitus, or limitation noted with foot and ankle range of motion bilateral. Muscular strength 5/5 in all groups tested bilateral.  Gait: Unassisted, Nonantalgic.    Radiographs:  None taken  Assessment & Plan:   Assessment: Diabetes mellitus with diabetic peripheral neuropathy benign skin lesions forefoot bilateral.  Painful mycotic nails bilateral.  Plan: Samples of skin and nail were taken today to be sent for pathologic  evaluation.  Debrided nails 1 through 5 bilaterally and debrided benign neoplastic skin lesions.  Discussed appropriate diabetic health, foot health nerve health excetra.     Audria Takeshita T. [place], [place]

## 2024-04-16 ENCOUNTER — Ambulatory Visit: Payer: Self-pay | Admitting: Podiatry

## 2024-04-16 NOTE — Progress Notes (Signed)
 Spoke with wife and next appointment is scheduled.

## 2024-04-20 ENCOUNTER — Encounter: Admitting: Physical Medicine and Rehabilitation

## 2024-04-28 ENCOUNTER — Ambulatory Visit: Admitting: Podiatry

## 2024-05-04 ENCOUNTER — Encounter: Payer: Self-pay | Admitting: Radiology

## 2024-05-07 ENCOUNTER — Other Ambulatory Visit: Payer: Self-pay

## 2024-05-07 ENCOUNTER — Ambulatory Visit: Admitting: Physical Medicine and Rehabilitation

## 2024-05-07 VITALS — BP 135/91 | HR 87

## 2024-05-07 DIAGNOSIS — E1165 Type 2 diabetes mellitus with hyperglycemia: Secondary | ICD-10-CM

## 2024-05-07 DIAGNOSIS — M5416 Radiculopathy, lumbar region: Secondary | ICD-10-CM

## 2024-05-07 MED ORDER — DEXAMETHASONE SOD PHOSPHATE PF 10 MG/ML IJ SOLN
15.0000 mg | Freq: Once | INTRAMUSCULAR | Status: AC
  Administered 2024-05-07: 15 mg

## 2024-05-07 NOTE — Progress Notes (Signed)
 Pain Scale   Average Pain 5 Patient advising he has chronic lower back pain radiating to right side pain increases when walking and standing and decreases when sitting  and resting        +Driver, -BT, -Dye Allergies.

## 2024-05-17 NOTE — Procedures (Signed)
 Lumbosacral Transforaminal Epidural Steroid Injection - Sub-Pedicular Approach with Fluoroscopic Guidance  Patient: Jesse Castillo      Date of Birth: 02/23/1966 MRN: [id number] PCP: Bevely Doffing, FNP      Visit Date: 05/07/2024   Universal Protocol:    Date/Time: 05/07/2024  Consent Given By: the patient  Position: PRONE  Additional Comments: Vital signs were monitored before and after the procedure. Patient was prepped and draped in the usual sterile fashion. The correct patient, procedure, and site was verified.   Injection Procedure Details:   Procedure diagnoses: Lumbar radiculopathy [M54.16]    Meds Administered:  Meds ordered this encounter  Medications   dexamethasone (DECADRON) injection 15 mg    Laterality: Right  Location/Site: L2  Needle:5.0 in., 22 ga.  Short bevel or Quincke spinal needle  Needle Placement: Transforaminal  Findings:    -Comments: Excellent flow of contrast along the nerve, nerve root and into the epidural space.  Procedure Details: After squaring off the end-plates to get a true AP view, the C-arm was positioned so that an oblique view of the foramen as noted above was visualized. The target area is just inferior to the nose of the scotty dog or sub pedicular. The soft tissues overlying this structure were infiltrated with 2-3 ml. of 1% Lidocaine without Epinephrine.  The spinal needle was inserted toward the target using a trajectory view along the fluoroscope beam.  Under AP and lateral visualization, the needle was advanced so it did not puncture dura and was located close the 6 O'Clock position of the pedical in AP tracterory. Biplanar projections were used to confirm position. Aspiration was confirmed to be negative for CSF and/or blood. A 1-2 ml. volume of Isovue -250 was injected and flow of contrast was noted at each level. Radiographs were obtained for documentation purposes.   After attaining the desired flow of contrast  documented above, a 0.5 to 1.0 ml test dose of 0.25% Marcaine was injected into each respective transforaminal space.  The patient was observed for 90 seconds post injection.  After no sensory deficits were reported, and normal lower extremity motor function was noted,   the above injectate was administered so that equal amounts of the injectate were placed at each foramen (level) into the transforaminal epidural space.   Additional Comments:  The patient tolerated the procedure well Dressing: 2 x 2 sterile gauze and Band-Aid    Post-procedure details: Patient was observed during the procedure. Post-procedure instructions were reviewed.  Patient left the clinic in stable condition.

## 2024-05-17 NOTE — Progress Notes (Signed)
 Jesse Castillo - 58 y.o. male MRN [id number]  Date of birth: 1966-06-24  Office Visit Note: Visit Date: 05/07/2024 PCP: Bevely Doffing, FNP Referred by: Bevely Doffing, FNP  Subjective: Chief Complaint  Patient presents with   Lower Back - Pain   HPI:  Jesse Castillo is a 58 y.o. male who comes in today at the request of Duwaine Pouch, FNP for planned Right L2-3 Lumbar Transforaminal epidural steroid injection with fluoroscopic guidance.  The patient has failed conservative care including home exercise, medications, time and activity modification.  This injection will be diagnostic and hopefully therapeutic.  Please see requesting physician notes for further details and justification.   ROS Otherwise per HPI.  Assessment & Plan: Visit Diagnoses:    ICD-10-CM   1. Lumbar radiculopathy  M54.16 XR C-ARM NO REPORT    Epidural Steroid injection    dexamethasone (DECADRON) injection 15 mg      Plan: No additional findings.   Meds & Orders:  Meds ordered this encounter  Medications   dexamethasone (DECADRON) injection 15 mg    Orders Placed This Encounter  Procedures   XR C-ARM NO REPORT   Epidural Steroid injection    Follow-up: Return for visit to requesting provider as needed.   Procedures: No procedures performed  Lumbosacral Transforaminal Epidural Steroid Injection - Sub-Pedicular Approach with Fluoroscopic Guidance  Patient: Jesse Castillo      Date of Birth: 10-Jun-1966 MRN: [id number] PCP: Bevely Doffing, FNP      Visit Date: 05/07/2024   Universal Protocol:    Date/Time: 05/07/2024  Consent Given By: the patient  Position: PRONE  Additional Comments: Vital signs were monitored before and after the procedure. Patient was prepped and draped in the usual sterile fashion. The correct patient, procedure, and site was verified.   Injection Procedure Details:   Procedure diagnoses: Lumbar radiculopathy [M54.16]    Meds Administered:  Meds ordered this  encounter  Medications   dexamethasone (DECADRON) injection 15 mg    Laterality: Right  Location/Site: L2  Needle:5.0 in., 22 ga.  Short bevel or Quincke spinal needle  Needle Placement: Transforaminal  Findings:    -Comments: Excellent flow of contrast along the nerve, nerve root and into the epidural space.  Procedure Details: After squaring off the end-plates to get a true AP view, the C-arm was positioned so that an oblique view of the foramen as noted above was visualized. The target area is just inferior to the nose of the scotty dog or sub pedicular. The soft tissues overlying this structure were infiltrated with 2-3 ml. of 1% Lidocaine without Epinephrine.  The spinal needle was inserted toward the target using a trajectory view along the fluoroscope beam.  Under AP and lateral visualization, the needle was advanced so it did not puncture dura and was located close the 6 O'Clock position of the pedical in AP tracterory. Biplanar projections were used to confirm position. Aspiration was confirmed to be negative for CSF and/or blood. A 1-2 ml. volume of Isovue -250 was injected and flow of contrast was noted at each level. Radiographs were obtained for documentation purposes.   After attaining the desired flow of contrast documented above, a 0.5 to 1.0 ml test dose of 0.25% Marcaine was injected into each respective transforaminal space.  The patient was observed for 90 seconds post injection.  After no sensory deficits were reported, and normal lower extremity motor function was noted,   the above injectate was administered so that equal amounts  of the injectate were placed at each foramen (level) into the transforaminal epidural space.   Additional Comments:  The patient tolerated the procedure well Dressing: 2 x 2 sterile gauze and Band-Aid    Post-procedure details: Patient was observed during the procedure. Post-procedure instructions were reviewed.  Patient left the  clinic in stable condition.    Clinical History: CT LUMBAR MYELOGRAM FINDINGS:   Vertebral alignment is normal. No fracture is identified. Multiple Schmorl's nodes are present, most prominently involving the L3 superior endplate. There is diffuse congenital narrowing of the lumbar spinal canal due to short pedicles. The conus medullaris terminates at L1-2. The right kidney is mildly low lying. Hepatic steatosis is noted.   T12-L1: Disc bulging, a left paracentral to subarticular disc protrusion, and mild facet and ligamentum flavum hypertrophy result in mild spinal stenosis and moderate left lateral recess stenosis without neural foraminal stenosis.   L1-2: Minimal disc bulging and mild facet hypertrophy without significant stenosis.   L2-3: Circumferential disc bulging and moderate facet and ligamentum flavum hypertrophy result in moderate spinal stenosis, mild bilateral lateral recess stenosis, and mild bilateral neural foraminal stenosis.   L3-4: Circumferential disc bulging, a right foraminal to extraforaminal disc osteophyte complex, and moderate facet and ligamentum flavum hypertrophy result in mild spinal stenosis, mild-to-moderate right and mild left lateral recess stenosis, and moderate right and mild left neural foraminal stenosis.   L4-5: Disc bulging, ligamentum flavum hypertrophy, and markedly severe right and moderate to severe left facet arthrosis result in mild-to-moderate spinal stenosis, mild-to-moderate bilateral lateral recess stenosis, and moderate to severe right and mild-to-moderate left neural foraminal stenosis. There are right-sided facet erosions.   L5-S1: Disc bulging, endplate spurring, and mild right and moderate left facet arthrosis result in mild left greater than right lateral recess stenosis and mild-to-moderate bilateral neural foraminal stenosis without spinal stenosis.   IMPRESSION: 1. 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 2. Moderate spinal stenosis at L2-3, L3-4, and L4-5 with standing. 3. Markedly severe right facet arthrosis at L4-5 with moderate to severe right neural foraminal stenosis. 4. Moderate right neural foraminal stenosis at L3-4. 5. Mild-to-moderate bilateral neural foraminal stenosis at L5-S1.     Electronically Signed   By: Dasie Hamburg M.D.   On: 11/05/2023 14:34     Objective:  VS:  HT:    WT:   BMI:     BP:(!) 135/91  HR:87bpm  TEMP: ( )  RESP:  Physical Exam Vitals and nursing note reviewed.  Constitutional:      General: He is not in acute distress.    Appearance: Normal appearance. He is not ill-appearing.  HENT:     Head: Normocephalic and atraumatic.     Right Ear: External ear normal.     Left Ear: External ear normal.     Nose: No congestion.  Eyes:     Extraocular Movements: Extraocular movements intact.  Cardiovascular:     Rate and Rhythm: Normal rate.     Pulses: Normal pulses.  Pulmonary:     Effort: Pulmonary effort is normal. No respiratory distress.  Abdominal:     General: There is no distension.     Palpations: Abdomen is soft.  Musculoskeletal:        General: No tenderness or signs of injury.     Cervical back: Neck supple.     Right lower leg: No edema.     Left lower leg: No edema.     Comments:  Patient has good distal strength without clonus.  Skin:    Findings: No erythema or rash.  Neurological:     General: No focal deficit present.     Mental Status: He is alert and oriented to person, place, and time.     Sensory: No sensory deficit.     Motor: No weakness or abnormal muscle tone.     Coordination: Coordination normal.  Psychiatric:        Mood and Affect: Mood normal.        Behavior: Behavior normal.      Imaging: No results found.

## 2024-06-02 ENCOUNTER — Other Ambulatory Visit: Payer: Self-pay

## 2024-06-02 DIAGNOSIS — F419 Anxiety disorder, unspecified: Secondary | ICD-10-CM

## 2024-06-02 DIAGNOSIS — K219 Gastro-esophageal reflux disease without esophagitis: Secondary | ICD-10-CM

## 2024-06-04 ENCOUNTER — Encounter: Payer: Self-pay | Admitting: Physical Medicine and Rehabilitation

## 2024-06-16 ENCOUNTER — Other Ambulatory Visit: Payer: Self-pay

## 2024-06-16 DIAGNOSIS — E1165 Type 2 diabetes mellitus with hyperglycemia: Secondary | ICD-10-CM

## 2024-06-16 MED ORDER — LIRAGLUTIDE 18 MG/3ML ~~LOC~~ SOPN: 1.8000 mg | PEN_INJECTOR | Freq: Every day | SUBCUTANEOUS | 2 refills | Status: DC

## 2024-06-20 ENCOUNTER — Other Ambulatory Visit: Payer: Self-pay

## 2024-06-20 DIAGNOSIS — E119 Type 2 diabetes mellitus without complications: Secondary | ICD-10-CM

## 2024-06-22 ENCOUNTER — Telehealth: Payer: Self-pay

## 2024-06-22 ENCOUNTER — Telehealth: Payer: Self-pay | Admitting: Neurology

## 2024-06-22 NOTE — Telephone Encounter (Signed)
 Tried calling pt with no answer. Unable to LVM due to mailbox not set-up.

## 2024-06-22 NOTE — Telephone Encounter (Signed)
 Patient called to schedule an appointment for stuttering. Started 3 months ago and stuttering getting worse

## 2024-06-23 ENCOUNTER — Other Ambulatory Visit

## 2024-06-23 DIAGNOSIS — E291 Testicular hypofunction: Secondary | ICD-10-CM | POA: Diagnosis not present

## 2024-06-23 DIAGNOSIS — N401 Enlarged prostate with lower urinary tract symptoms: Secondary | ICD-10-CM

## 2024-06-23 DIAGNOSIS — N138 Other obstructive and reflux uropathy: Secondary | ICD-10-CM | POA: Diagnosis not present

## 2024-06-24 LAB — PSA, TOTAL AND FREE
PSA, Free Pct: 38.6 %
PSA, Free: 0.27 ng/mL
Prostate Specific Ag, Serum: 0.7 ng/mL (ref 0.0–4.0)

## 2024-06-24 LAB — HEMOGLOBIN AND HEMATOCRIT, BLOOD
Hematocrit: 46.1 % (ref 37.5–51.0)
Hemoglobin: 16.1 g/dL (ref 13.0–17.7)

## 2024-06-28 NOTE — Progress Notes (Unsigned)
 "  Assessment: 1.  Hypogonadism.  On testosterone  cypionate every 7 to 10 days-injection sporadic.  He is doing well with this  2.  Erythrocytosis, hemoglobin now 16.1.  He gives blood every now and then  3.  ED.  On cocktail of daily Cialis , as needed Levitra /Viagra .  This seems to work okay with him   Plan: 1.  Heart healthy lifestyle discussed with the patient.  He is on a supplement for his prostate to keep his PSA down.  I suggested he stop taking this.  2.  Continue testosterone  injection 100 mg weekly  3.  Recommend occasional phlebotomy/blood donation  4.  Come back in a year for recheck   HPI:   12.30.2025: Still on testosterone  injections, 100 mg IM every week.  He is doing well with this.  He does use sildenafil , tadalafil  and Levitra  as needed.   12/26/23: Jesse Castillo returns today in f/u.  He remains on TRT and his 58 with a Hgb of 16.3.  He is on sildenafil  and tadalafil  with a partial response but difficulty maintaining.  He can't reach a climax.  He has tried Corporate Investment Banker without success.  His IPSS is 12 with urgency and frequency on furosemide .  He has lost 20lb on victoza .  He has had no hematuria.    06/20/23: Jesse Castillo returns today in f/u.  He was in the ER on 04/11/23 for some genital pain and a history of recent gross hematuria.  He had been treated with bactrim  for prostatitis and had no further bleeding.  He had a CT hematuria study in October and apart from prostate enlargement, there were no GU findings.  His UA had lg blood on 03/19/23 but was clear on 04/11/23.  He had labs on 06/13/23 and his Hgb was 16.3 down from 17.4 on 04/11/23 but his T was only 52 on 06/13/23.  His PSA was 0.5.  He had a scrotal US  on 04/11/23 that showed bilateral epididymal cysts and spermatoceles.  He has a dull right tesicular ache with sitting.  He has persistent ED with inability to maintain despite combination oral therapy.    07/12/22: Jesse Castillo returns today in f/u.  He hasn't donated blood as  requested.  He is scheduled by Dr. Shellia to see hematology on 08/02/22. He has not had his testosterone  injection in 2 weeks.  His Hgb on 1/3 was up to 18.5 with a T of 457.  He remains on tadalafil  but went to 10mg  qod because of supply issues.  He uses the sildenafil  100mg  prn as well.   His IPSS is 11.  He has nocturia x 1.  He reports a slow stream but is primarily reporting post void dribbling.  He has had no hematuria.    GU hx: Jesse Castillo is a 58 yo male who is sent for a history of hypogonadism that is being managed with Testosterone  cypionate 100mg  q1wk.  His level is 751 at a peak.  His Hgb is stab1e at 17.4 and he has been donating blood regularly.  He has improved energy with the med.  His PSA was 0.8 on 02/08/21.  He has moderate LUTS with an IPSS of 9.  He can have some hesitancy but his IPSS is 8 with nocturia x 2.  His PSA is 0.6.  He has no prior stones, UTI's or GU surgery.   He has ED and is on sildenafil  and  tadalafil  with an improved response but he had decreased sensation difficulty with reaching a  climax but that is better off of the Prozac . He doesn't have any problems with the injections.  He still has some issues with irritability. UA is clear.      ROS:  Review of Systems  Constitutional:  Positive for weight loss.  HENT:  Positive for congestion.   Respiratory:  Positive for cough and shortness of breath.   Musculoskeletal:  Positive for back pain.  Endo/Heme/Allergies:  Positive for polydipsia.  Psychiatric/Behavioral:  Positive for depression. The patient is nervous/anxious.   All other systems reviewed and are negative.   Allergies  Allergen Reactions   Mobic  [Meloxicam ] Other (See Comments)    Mouth sores    Past Medical History:  Diagnosis Date   Enlarged prostate    GERD (gastroesophageal reflux disease)    Hypertension    Neuropathy    RLE   Pneumonia due to COVID-19 virus 07/2020   Restless leg    Sleep apnea    Tremor of left hand     Past Surgical  History:  Procedure Laterality Date   HERNIA REPAIR      Social History   Socioeconomic History   Marital status: Married    Spouse name: Not on file   Number of children: Not on file   Years of education: Not on file   Highest education level: 12th grade  Occupational History   Occupation: Dentist work for [employer]  Tobacco Use   Smoking status: Former    Current packs/day: 0.00    Average packs/day: 3.0 packs/day for 18.0 years (54.0 ttl pk-yrs)    Types: Cigarettes    Start date: 07/02/1989    Quit date: 07/03/2007    Years since quitting: 17.0   Smokeless tobacco: Never  Vaping Use   Vaping status: Never Used  Substance and Sexual Activity   Alcohol use: Never   Drug use: Never   Sexual activity: Yes  Other Topics Concern   Not on file  Social History Narrative   Not on file   Social Drivers of Health   Tobacco Use: Medium Risk (03/31/2024)   Patient History    Smoking Tobacco Use: Former    Smokeless Tobacco Use: Never    Passive Exposure: Not on Actuary Strain: Low Risk (09/19/2022)   Overall Financial Resource Strain (CARDIA)    Difficulty of Paying Living Expenses: Not very hard  Food Insecurity: Food Insecurity Present (09/19/2022)   Hunger Vital Sign    Worried About Running Out of Food in the Last Year: Sometimes true    Ran Out of Food in the Last Year: Sometimes true  Transportation Needs: No Transportation Needs (09/19/2022)   PRAPARE - Administrator, Civil Service (Medical): No    Lack of Transportation (Non-Medical): No  Physical Activity: Insufficiently Active (09/19/2022)   Exercise Vital Sign    Days of Exercise per Week: 5 days    Minutes of Exercise per Session: 10 min  Stress: Stress Concern Present (09/19/2022)   [name] of Occupational Health - Occupational Stress Questionnaire    Feeling of Stress : To some extent  Social Connections: Moderately Integrated (09/19/2022)   Social Connection and Isolation  Panel    Frequency of Communication with Friends and Family: More than three times a week    Frequency of Social Gatherings with Friends and Family: Once a week    Attends Religious Services: 1 to 4 times per year    Active Member of Clubs or  Organizations: No    Attends Banker Meetings: Not on file    Marital Status: Married  Intimate Partner Violence: Not At Risk (08/02/2022)   Humiliation, Afraid, Rape, and Kick questionnaire    Fear of Current or Ex-Partner: No    Emotionally Abused: No    Physically Abused: No    Sexually Abused: No  Depression (PHQ2-9): High Risk (03/13/2024)   Depression (PHQ2-9)    PHQ-2 Score: 13  Alcohol Screen: Not on file  Housing: Low Risk (09/19/2022)   Housing    Last Housing Risk Score: 0  Utilities: Not At Risk (08/02/2022)   AHC Utilities    Threatened with loss of utilities: No  Health Literacy: Not on file    Family History  Problem Relation Age of Onset   Heart disease Mother    Tremor Father    Hypertension Daughter    Heart disease Maternal Grandmother    Parkinson's disease Paternal Grandmother     Anti-infectives: Anti-infectives (From admission, onward)    None       Current Outpatient Medications  Medication Sig Dispense Refill   amLODipine  (NORVASC ) 5 MG tablet TAKE ONE (1) TABLET BY MOUTH EVERY DAY 90 tablet 3   aspirin EC 81 MG tablet Take 81 mg by mouth daily.     buPROPion  (WELLBUTRIN  XL) 150 MG 24 hr tablet TAKE ONE TABLET (150 MG TOTAL) BY MOUTH DAILY. 30 tablet 2   cyclobenzaprine  (FLEXERIL ) 10 MG tablet TAKE ONE TABLET (10MG  TOTAL) BY MOUTH THREE TIMES DAILY AS NEEDED FOR MUSCLE SPASMS 60 tablet 2   diclofenac  (VOLTAREN ) 75 MG EC tablet TAKE ONE TABLET (75MG  TOTAL) BY MOUTH TWO TIMES DAILY 180 tablet 0   famotidine  (PEPCID ) 40 MG tablet TAKE ONE TABLET BY MOUTH EVERY NIGHT AT BEDTIME 90 tablet 0   fluticasone  (FLONASE ) 50 MCG/ACT nasal spray Place 2 sprays into both nostrils daily. 16 g 6   furosemide   (LASIX ) 80 MG tablet TAKE ONE TABLET (80MG  TOTAL) BY MOUTH TWO TIMES DAILY 180 tablet 3   gabapentin  (NEURONTIN ) 600 MG tablet TAKE ONE TABLET (600MG  TOTAL) BY MOUTH THREE TIMES DAILY 90 tablet 2   HYDROcodone -acetaminophen  (NORCO) 5-325 MG tablet Take 1 tablet by mouth every 6 (six) hours as needed for moderate pain. 6 tablet 0   hydrOXYzine  (VISTARIL ) 25 MG capsule Take 1 capsule (25 mg total) by mouth every 8 (eight) hours as needed. 30 capsule 0   icosapent  Ethyl (VASCEPA ) 1 g capsule Take 2 capsules (2 g total) by mouth 2 (two) times daily. 360 capsule 3   Insulin Pen Needle (PEN NEEDLES) 32G X 4 MM MISC 1 each by Does not apply route daily. 100 each 3   liraglutide  (VICTOZA ) 18 MG/3ML SOPN Inject 1.8 mg into the skin daily. 9 mL 2   metFORMIN  (GLUCOPHAGE -XR) 500 MG 24 hr tablet TAKE 2 TABLETS (1000 MG TOTAL) BY MOUTH 2 (TWO) TIMES DAILY WITH A MEAL 120 tablet 2   Misc Natural Products (URINOZINC PLUS PO) Take by mouth.     montelukast  (SINGULAIR ) 10 MG tablet TAKE ONE TABLET (10MG  TOTAL) BY MOUTH ATBEDTIME 90 tablet 3   Multiple Vitamins-Minerals (MULTIVITAMIN MEN PO) Take by mouth.     NEEDLE, DISP, 23 G (BD DISP NEEDLE) 23G X 1 MISC Use every 14 days with testosterone  50 each 1   ondansetron  (ZOFRAN ) 4 MG tablet Take 1 tablet (4 mg total) by mouth every 6 (six) hours. 12 tablet 0   pantoprazole  (PROTONIX )  40 MG tablet Take 1 tablet (40 mg total) by mouth 2 (two) times daily. 180 tablet 3   potassium chloride  SA (KLOR-CON  M) 20 MEQ tablet TAKE ONE TABLET ( TOTAL) BY MOUTH DAILY 90 tablet 3   primidone  (MYSOLINE ) 50 MG tablet TAKE ONE TABLET (50MG  TOTAL) BY MOUTH FOUR TIMES DAILY. 120 tablet 1   Probiotic Product (HEALTHY COLON PO) Take by mouth.     propranolol  ER (INDERAL  LA) 60 MG 24 hr capsule TAKE ONE CAPSULE (60MG  TOTAL) BY MOUTH DAILY 30 capsule 5   rOPINIRole  (REQUIP ) 4 MG tablet TAKE ONE TABLET (4MG  TOTAL) BY MOUTH AT BEDTIME 90 tablet 3   sildenafil  (VIAGRA ) 100 MG tablet  Take 1 tablet (100 mg total) by mouth daily as needed. 30 tablet 11   Specialty Vitamins Products (ECHINACEA C COMPLETE PO) Take by mouth.     SYRINGE-NEEDLE, DISP, 3 ML (B-D 3CC LUER-LOK SYR 23GX1) 23G X 1 3 ML MISC USE EVERY 14 DAYS WITH TESTOSTERONE  50 each 0   Syringe/Needle, Disp, 18G X 1 3 ML MISC 1 Device by Does not apply route every 14 (fourteen) days. 12 each 1   tadalafil  (CIALIS ) 10 MG tablet Take 1 tablet (10 mg total) by mouth daily as needed for erectile dysfunction. 30 tablet 11   tadalafil  (CIALIS ) 5 MG tablet Take 1 tablet (5 mg total) by mouth daily as needed for erectile dysfunction. 30 tablet 11   testosterone  cypionate (DEPOTESTOSTERONE CYPIONATE) 200 MG/ML injection INJECT 0.5 ML (100 MG TOTAL) INTO THE MUSCLE EVERY 7 (SEVEN) DAYS 10 mL 1   traZODone  (DESYREL ) 150 MG tablet TAKE ONE TABLET (150MG  TOTAL) BY MOUTH AT BEDTIME 90 tablet 1   vardenafil  (LEVITRA ) 20 MG tablet Take 1 tablet (20 mg total) by mouth daily as needed for erectile dysfunction. 10 tablet 11   No current facility-administered medications for this visit.     Objective: Vital signs in last 24 hours: There were no vitals taken for this visit.  Intake/Output from previous day: No intake/output data recorded. Intake/Output this shift: @IOTHISSHIFT @   Physical Exam Vitals reviewed.  Constitutional:      Appearance: Normal appearance. He is obese.  Neurological:     Mental Status: He is alert.     Lab Results:  Recent Results (from the past 2160 hours)  PSA, total and free     Status: None   Collection Time: 06/23/24  2:07 PM  Result Value Ref Range   Prostate Specific Ag, Serum 0.7 0.0 - 4.0 ng/mL    Comment: Roche ECLIA methodology. According to the American Urological Association, Serum PSA should decrease and remain at undetectable levels after radical prostatectomy. The AUA defines biochemical recurrence as an initial PSA value 0.2 ng/mL or greater followed by a subsequent  confirmatory PSA value 0.2 ng/mL or greater. Values obtained with different assay methods or kits cannot be used interchangeably. Results cannot be interpreted as absolute evidence of the presence or absence of malignant disease.    PSA, Free 0.27 N/A ng/mL    Comment: Roche ECLIA methodology.   PSA, Free Pct 38.6 %    Comment: The table below lists the probability of prostate cancer for men with non-suspicious DRE results and total PSA between 4 and 10 ng/mL, by patient age Jaycee rosemarie cherry, JAMA 1998, [id number]).                   % Free PSA       50-64 yr  65-75 yr                   0.00-10.00%        56%             55%                  10.01-15.00%        24%             35%                  15.01-20.00%        17%             23%                  20.01-25.00%        10%             20%                       >25.00%         5%              9% Please note:  Catalona et al did not make specific               recommendations regarding the use of               percent free PSA for any other population               of men.   Hemoglobin and hematocrit, blood     Status: None   Collection Time: 06/23/24  2:07 PM  Result Value Ref Range   Hemoglobin 16.1 13.0 - 17.7 g/dL   Hematocrit 53.8 62.4 - 51.0 %    Review of laboratories (from this month unless otherwise noted)-  Hemoglobin/hematocrit--16.1/46.1  Testosterone  level (June/2025)--483  PSA--0.7  Prior records reviewed "

## 2024-06-30 ENCOUNTER — Other Ambulatory Visit: Payer: Self-pay

## 2024-06-30 ENCOUNTER — Other Ambulatory Visit: Payer: Self-pay | Admitting: Orthopedic Surgery

## 2024-06-30 ENCOUNTER — Ambulatory Visit (INDEPENDENT_AMBULATORY_CARE_PROVIDER_SITE_OTHER): Admitting: Urology

## 2024-06-30 VITALS — BP 133/83 | HR 76

## 2024-06-30 DIAGNOSIS — N529 Male erectile dysfunction, unspecified: Secondary | ICD-10-CM

## 2024-06-30 DIAGNOSIS — R3915 Urgency of urination: Secondary | ICD-10-CM

## 2024-06-30 DIAGNOSIS — G252 Other specified forms of tremor: Secondary | ICD-10-CM

## 2024-06-30 DIAGNOSIS — E291 Testicular hypofunction: Secondary | ICD-10-CM | POA: Diagnosis not present

## 2024-06-30 DIAGNOSIS — N138 Other obstructive and reflux uropathy: Secondary | ICD-10-CM

## 2024-06-30 DIAGNOSIS — Z79890 Hormone replacement therapy (postmenopausal): Secondary | ICD-10-CM

## 2024-06-30 DIAGNOSIS — G629 Polyneuropathy, unspecified: Secondary | ICD-10-CM

## 2024-06-30 DIAGNOSIS — D751 Secondary polycythemia: Secondary | ICD-10-CM | POA: Diagnosis not present

## 2024-06-30 DIAGNOSIS — R7989 Other specified abnormal findings of blood chemistry: Secondary | ICD-10-CM

## 2024-06-30 LAB — URINALYSIS, ROUTINE W REFLEX MICROSCOPIC
Bilirubin, UA: NEGATIVE
Glucose, UA: NEGATIVE
Ketones, UA: NEGATIVE
Leukocytes,UA: NEGATIVE
Nitrite, UA: NEGATIVE
Protein,UA: NEGATIVE
RBC, UA: NEGATIVE
Specific Gravity, UA: 1.015 (ref 1.005–1.030)
Urobilinogen, Ur: 0.2 mg/dL (ref 0.2–1.0)
pH, UA: 6 (ref 5.0–7.5)

## 2024-06-30 MED ORDER — SILDENAFIL CITRATE 100 MG PO TABS: 100.0000 mg | ORAL_TABLET | Freq: Every day | ORAL | 11 refills | Status: AC | PRN

## 2024-06-30 MED ORDER — TESTOSTERONE CYPIONATE 200 MG/ML IM SOLN: INTRAMUSCULAR | 1 refills | Status: AC

## 2024-07-03 NOTE — Progress Notes (Signed)
 Jesse Castillo                                          MRN: [id number]   07/03/2024   The [facility] Quality Team Specialist reviewed this patient medical record for the purposes of chart review for care gap closure. The following were reviewed: abstraction for care gap closure-controlling blood pressure.    [facility] Quality Team

## 2024-07-04 ENCOUNTER — Other Ambulatory Visit: Payer: Self-pay | Admitting: Internal Medicine

## 2024-07-04 DIAGNOSIS — E1165 Type 2 diabetes mellitus with hyperglycemia: Secondary | ICD-10-CM

## 2024-07-06 ENCOUNTER — Ambulatory Visit

## 2024-07-10 ENCOUNTER — Other Ambulatory Visit: Payer: Self-pay | Admitting: Internal Medicine

## 2024-07-16 ENCOUNTER — Other Ambulatory Visit (HOSPITAL_COMMUNITY): Payer: Self-pay

## 2024-07-16 ENCOUNTER — Telehealth: Payer: Self-pay | Admitting: Pharmacy Technician

## 2024-07-16 NOTE — Telephone Encounter (Addendum)
 Pharmacy Patient Advocate Encounter   Received notification from Onbase CMM KEY that prior authorization for Liraglutide  18mg /16ml is required/requested.   Insurance verification completed.   The patient is insured through [facility] MEDICAID.   Per test claim: Pt previously had another insurance plan, but it stopped January 1st. I spoke with Jesse Castillo and he is going to [employer] to let them know and hopefully get it taken off so they will be able to process claims and PAs if needed.  He also expressed that he's been out of this medication for 3-4 days. I'm not sure if you guys have samples or can instruct him on what to do since his sugar has increased. He said he has an apt tomorrow morning.   Archived CMM KEY [id number]

## 2024-07-17 ENCOUNTER — Ambulatory Visit

## 2024-07-17 ENCOUNTER — Other Ambulatory Visit (HOSPITAL_COMMUNITY): Payer: Self-pay

## 2024-07-17 VITALS — BP 122/82 | HR 89 | Ht 71.0 in | Wt 320.0 lb

## 2024-07-17 DIAGNOSIS — G629 Polyneuropathy, unspecified: Secondary | ICD-10-CM

## 2024-07-17 DIAGNOSIS — E1165 Type 2 diabetes mellitus with hyperglycemia: Secondary | ICD-10-CM | POA: Diagnosis not present

## 2024-07-17 DIAGNOSIS — R4182 Altered mental status, unspecified: Secondary | ICD-10-CM | POA: Diagnosis not present

## 2024-07-17 DIAGNOSIS — R251 Tremor, unspecified: Secondary | ICD-10-CM

## 2024-07-17 DIAGNOSIS — G252 Other specified forms of tremor: Secondary | ICD-10-CM

## 2024-07-17 DIAGNOSIS — F8081 Childhood onset fluency disorder: Secondary | ICD-10-CM

## 2024-07-17 DIAGNOSIS — Z7984 Long term (current) use of oral hypoglycemic drugs: Secondary | ICD-10-CM

## 2024-07-17 MED ORDER — LANTUS SOLOSTAR 100 UNIT/ML ~~LOC~~ SOPN: 10.0000 [IU] | PEN_INJECTOR | Freq: Every day | SUBCUTANEOUS | 99 refills | Status: DC

## 2024-07-17 NOTE — Progress Notes (Signed)
 "  Established Patient Office Visit  Subjective   Patient ID: Jesse Castillo, male    DOB: [birth year]  Age: 58 y.o. MRN: [id number]  Chief Complaint  Patient presents with   Medical Management of Chronic Issues    Follow up , patient reports he is having issues with shaking and stuttering    HPI Discussed the use of AI scribe software for clinical note transcription with the patient, who gave verbal consent to proceed.  History of Present Illness    Jesse Castillo is a 59 year old male who presents with worsening stuttering and tremors. He is accompanied by his wife.  Speech disturbance - Worsening stuttering over the past five to six months - Stuttering episodes may last up to 45 seconds to articulate a word - Pharmacists confirmed current medications are not contributing to stuttering  Tremors and motor dysfunction - Severe tremors with progressive worsening - Frequent dropping of objects due to tremors - Evaluated by a neurologist for tremors  Episodes of altered awareness and involuntary movements - Falls asleep in chair after work with episodes of involuntary movements including hitting face and slapping legs - Episodes last approximately five minutes - Unaware of events upon waking - No headaches or memory of the events - Occasional loss of balance without dizziness or headache  Cognitive impairment and disorientation - Episodes of disorientation while driving, forgetting location - Difficulty remembering routine tasks at work - Wife expresses concern for safety while driving and operating machinery  Sleep disturbance and sleep apnea - History of sleep apnea, uses CPAP machine - Poor sleep quality with frequent tossing and turning - Removes CPAP mask during sleep - Previously slept well with CPAP, now unable to achieve more than two hours of continuous sleep  Glycemic control - Has not taken Victoza  pen for six days due to insurance issues - Blood glucose increased  from 135-140 mg/dL to 790-786 mg/dL - Continues metformin  1000 mg twice daily - Last hemoglobin A1c was 6%  Behavioral changes - Episodes of 'switch of rages' characterized by anger and frustration - Unaware of these behavioral changes      Patient Active Problem List   Diagnosis Date Noted   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   Altered mental status 07/21/2024   Type 2 diabetes mellitus without complication, without long-term current use of insulin (HCC) 03/17/2024   Onychomycosis 03/17/2024   Anxiety and depression 12/13/2023   Chronic right hip pain 10/09/2023   Hypertriglyceridemia 06/21/2023   Type 2 diabetes mellitus with hyperglycemia (HCC) 05/07/2023   Abnormal TSH 05/07/2023   Screening for hyperlipidemia 01/30/2023   Prediabetes 01/30/2023   Restrictive lung disease secondary to obesity 12/14/2022   Congestive heart failure (CHF) (HCC) 10/31/2022   Erythrocytosis 09/19/2022   Mixed hyperlipidemia 09/19/2022   Generalized anxiety disorder 03/07/2022   Depression, recurrent 03/07/2022   Insomnia due to medical condition 03/07/2022   Essential tremor 04/24/2021   Seasonal allergies 04/24/2021   Umbilical hernia without obstruction and without gangrene 04/24/2021   Erectile dysfunction 08/04/2020   Gastroesophageal reflux disease 12/21/2019   Essential hypertension 09/20/2019   Arthritis 09/20/2019   Morbid obesity (HCC) 09/20/2019   Low testosterone  in male 09/18/2019   Enlarged prostate    Neuropathy    Restless leg    OSA on CPAP     ROS    Objective:     BP 122/82 (BP Location: Left Arm, Patient Position: Sitting, Cuff Size: Normal)   Pulse 89  Ht 5' 11 (1.803 m)   Wt (!) 320 lb (145.2 kg)   SpO2 93%   BMI 44.63 kg/m  BP Readings from Last 3 Encounters:  07/18/24 135/74  07/17/24 122/82  06/30/24 133/83   Wt Readings from Last 3 Encounters:  07/18/24 (!) 319 lb 10.7 oz (145 kg)  07/17/24 (!) 320 lb (145.2 kg)  03/13/24 (!) 318 lb 1.9 oz (144.3 kg)      Physical Exam Vitals and nursing note reviewed. Exam conducted with a chaperone present (pt's wife is with him today).  Constitutional:      Appearance: Normal appearance. He is obese.  HENT:     Head: Normocephalic.     Right Ear: Tympanic membrane, ear canal and external ear normal.     Left Ear: Tympanic membrane, ear canal and external ear normal.     Nose: Nose normal.     Mouth/Throat:     Mouth: Mucous membranes are moist.     Pharynx: Oropharynx is clear.  Eyes:     Extraocular Movements: Extraocular movements intact.     Pupils: Pupils are equal, round, and reactive to light.  Cardiovascular:     Rate and Rhythm: Normal rate and regular rhythm.  Pulmonary:     Effort: Pulmonary effort is normal.     Breath sounds: Normal breath sounds.  Musculoskeletal:     Cervical back: Normal range of motion and neck supple.  Neurological:     Mental Status: He is alert and oriented to person, place, and time.     Cranial Nerves: Cranial nerves 2-12 are intact.     Motor: Motor function is intact.     Coordination: Coordination is intact.     Gait: Gait is intact.  Psychiatric:        Mood and Affect: Mood normal.        Thought Content: Thought content normal.     Diabetic foot exam was performed with the following findings:   No deformities, ulcerations, or other skin breakdown Normal sensation of 10g monofilament Intact posterior tibialis and dorsalis pedis pulses    Last CBC Lab Results  Component Value Date   WBC 9.9 07/18/2024   HGB 15.3 07/18/2024   HCT 45.0 07/18/2024   MCV 87.3 07/18/2024   MCH 31.3 07/18/2024   RDW 13.2 07/18/2024   PLT 210 07/18/2024   Last metabolic panel Lab Results  Component Value Date   GLUCOSE 206 (H) 07/18/2024   NA 138 07/18/2024   K 3.6 07/18/2024   CL 96 (L) 07/18/2024   CO2 22 07/18/2024   BUN 15 07/18/2024   CREATININE 1.20 07/18/2024   GFRNONAA >60 07/18/2024   CALCIUM 9.6 07/18/2024   PROT 7.4 07/18/2024    ALBUMIN 4.3 07/18/2024   LABGLOB 2.8 07/17/2024   AGRATIO 1.8 03/07/2022   BILITOT 0.6 07/18/2024   ALKPHOS 81 07/18/2024   AST 50 (H) 07/18/2024   ALT 46 (H) 07/18/2024   ANIONGAP 18 (H) 07/18/2024   Last lipids Lab Results  Component Value Date   CHOL 209 (H) 07/17/2024   HDL 26 (L) 07/17/2024   LDLCALC 88 07/17/2024   TRIG 579 (HH) 07/17/2024   CHOLHDL 8.0 (H) 07/17/2024   Last hemoglobin A1c Lab Results  Component Value Date   HGBA1C 6.5 (H) 07/17/2024   Last thyroid  functions Lab Results  Component Value Date   TSH 3.130 05/07/2023   FREET4 1.10 05/07/2023   Last vitamin D No results found for: 25OHVITD2,  25OHVITD3, VD25OH Last vitamin B12 and Folate Lab Results  Component Value Date   VITAMINB12 721 07/17/2024   FOLATE >20.0 07/17/2024      The 10-year ASCVD risk score (Arnett DK, et al., 2019) is: 25.4%    Assessment & Plan:   Problem List Items Addressed This Visit       Endocrine   Type 2 diabetes mellitus with hyperglycemia (HCC) - Primary   Hyperglycemia due to Victoza  discontinuation for six days. Blood glucose elevated. Previous A1c was 6.0%. - Ordered blood work including A1c and urine specimen. - Prescribed Lantus  as temporary replacement for Victoza .      Relevant Medications   liraglutide  (VICTOZA ) 18 MG/3ML SOPN   Other Relevant Orders   CMP14+EGFR (Completed)   Hemoglobin A1c (Completed)   Microalbumin / creatinine urine ratio (Completed)   Lipid panel (Completed)   CBC with Differential/Platelet (Completed)   B12 and Folate Panel (Completed)   HM Diabetes Foot Exam (Completed)     Nervous and Auditory   Neuropathy   Relevant Orders   CBC with Differential/Platelet (Completed)   B12 and Folate Panel (Completed)     Other   Action tremor   Worsening action tremors and stuttering over 5-6 months. Episodes of altered mental status. Differential includes possible TIA or other neurological event. - Ordered CT scan of the  head to rule out TIA or other neurological events. - Updated referral to neurology for further evaluation.      Relevant Orders   CBC with Differential/Platelet (Completed)   B12 and Folate Panel (Completed)   Ambulatory referral to Neurology   Altered mental status   Pt is alert and oriented in office today.  He is able to answer questions and follow commands.  We will obtain labs and a CT of head for further evaluation of symptoms.        Relevant Orders   CT HEAD WO CONTRAST ( )   Other Visit Diagnoses       Stammering and stuttering       Relevant Orders   Ambulatory referral to Neurology     Shakiness       Relevant Orders   Ambulatory referral to Neurology      No follow-ups on file.    Leita Longs, FNP  "

## 2024-07-18 ENCOUNTER — Emergency Department (HOSPITAL_COMMUNITY)

## 2024-07-18 ENCOUNTER — Other Ambulatory Visit: Payer: Self-pay

## 2024-07-18 ENCOUNTER — Emergency Department (HOSPITAL_COMMUNITY)
Admission: EM | Admit: 2024-07-18 | Discharge: 2024-07-18 | Disposition: A | Discharge status: Home / Self Care | Attending: Emergency Medicine | Admitting: Emergency Medicine

## 2024-07-18 DIAGNOSIS — Z794 Long term (current) use of insulin: Secondary | ICD-10-CM | POA: Insufficient documentation

## 2024-07-18 DIAGNOSIS — Z7982 Long term (current) use of aspirin: Secondary | ICD-10-CM | POA: Diagnosis not present

## 2024-07-18 DIAGNOSIS — Z7985 Long-term (current) use of injectable non-insulin antidiabetic drugs: Secondary | ICD-10-CM | POA: Diagnosis not present

## 2024-07-18 DIAGNOSIS — I10 Essential (primary) hypertension: Secondary | ICD-10-CM | POA: Diagnosis not present

## 2024-07-18 DIAGNOSIS — R4182 Altered mental status, unspecified: Secondary | ICD-10-CM | POA: Diagnosis present

## 2024-07-18 DIAGNOSIS — E119 Type 2 diabetes mellitus without complications: Secondary | ICD-10-CM | POA: Diagnosis not present

## 2024-07-18 DIAGNOSIS — Z7984 Long term (current) use of oral hypoglycemic drugs: Secondary | ICD-10-CM | POA: Diagnosis not present

## 2024-07-18 DIAGNOSIS — R404 Transient alteration of awareness: Secondary | ICD-10-CM

## 2024-07-18 LAB — COMPREHENSIVE METABOLIC PANEL WITH GFR
ALT: 46 U/L — ABNORMAL HIGH (ref 0–44)
AST: 50 U/L — ABNORMAL HIGH (ref 15–41)
Albumin: 4.3 g/dL (ref 3.5–5.0)
Alkaline Phosphatase: 81 U/L (ref 38–126)
Anion gap: 18 — ABNORMAL HIGH (ref 5–15)
BUN: 14 mg/dL (ref 6–20)
CO2: 22 mmol/L (ref 22–32)
Calcium: 9.6 mg/dL (ref 8.9–10.3)
Chloride: 96 mmol/L — ABNORMAL LOW (ref 98–111)
Creatinine, Ser: 1.15 mg/dL (ref 0.61–1.24)
GFR, Estimated: >60 mL/min
Glucose, Bld: 209 mg/dL — ABNORMAL HIGH (ref 70–99)
Potassium: 3.7 mmol/L (ref 3.5–5.1)
Sodium: 136 mmol/L (ref 135–145)
Total Bilirubin: 0.6 mg/dL (ref 0.0–1.2)
Total Protein: 7.4 g/dL (ref 6.5–8.1)

## 2024-07-18 LAB — I-STAT CHEM 8, ED
BUN: 15 mg/dL (ref 6–20)
Calcium, Ion: 1.14 mmol/L — ABNORMAL LOW (ref 1.15–1.40)
Chloride: 96 mmol/L — ABNORMAL LOW (ref 98–111)
Creatinine, Ser: 1.2 mg/dL (ref 0.61–1.24)
Glucose, Bld: 206 mg/dL — ABNORMAL HIGH (ref 70–99)
HCT: 45 % (ref 39.0–52.0)
Hemoglobin: 15.3 g/dL (ref 13.0–17.0)
Potassium: 3.6 mmol/L (ref 3.5–5.1)
Sodium: 138 mmol/L (ref 135–145)
TCO2: 25 mmol/L (ref 22–32)

## 2024-07-18 LAB — URINALYSIS, ROUTINE W REFLEX MICROSCOPIC
Bacteria, UA: NONE SEEN
Bilirubin Urine: NEGATIVE
Glucose, UA: NEGATIVE mg/dL
Ketones, ur: NEGATIVE mg/dL
Leukocytes,Ua: NEGATIVE
Nitrite: NEGATIVE
Protein, ur: NEGATIVE mg/dL
Specific Gravity, Urine: 1.012 (ref 1.005–1.030)
pH: 5 (ref 5.0–8.0)

## 2024-07-18 LAB — CBC WITH DIFFERENTIAL/PLATELET
Abs Immature Granulocytes: 0.06 K/uL (ref 0.00–0.07)
Basophils Absolute: 0.1 K/uL (ref 0.0–0.1)
Basophils Relative: 1 %
Eosinophils Absolute: 0.1 K/uL (ref 0.0–0.5)
Eosinophils Relative: 1 %
HCT: 44 % (ref 39.0–52.0)
Hemoglobin: 15.8 g/dL (ref 13.0–17.0)
Immature Granulocytes: 1 %
Lymphocytes Relative: 20 %
Lymphs Abs: 2 K/uL (ref 0.7–4.0)
MCH: 31.3 pg (ref 26.0–34.0)
MCHC: 35.9 g/dL (ref 30.0–36.0)
MCV: 87.3 fL (ref 80.0–100.0)
Monocytes Absolute: 1.5 K/uL — ABNORMAL HIGH (ref 0.1–1.0)
Monocytes Relative: 15 %
Neutro Abs: 6.1 K/uL (ref 1.7–7.7)
Neutrophils Relative %: 62 %
Platelets: 210 K/uL (ref 150–400)
RBC: 5.04 MIL/uL (ref 4.22–5.81)
RDW: 13.2 % (ref 11.5–15.5)
WBC: 9.9 K/uL (ref 4.0–10.5)
nRBC: 0 % (ref 0.0–0.2)

## 2024-07-18 LAB — URINE DRUG SCREEN
Amphetamines: NEGATIVE
Barbiturates: POSITIVE — AB
Benzodiazepines: POSITIVE — AB
Cocaine: NEGATIVE
Fentanyl: NEGATIVE
Methadone Scn, Ur: NEGATIVE
Opiates: NEGATIVE
Tetrahydrocannabinol: NEGATIVE

## 2024-07-18 LAB — ETHANOL: Alcohol, Ethyl (B): <15 mg/dL

## 2024-07-18 LAB — CBG MONITORING, ED
Glucose-Capillary: 200 mg/dL — ABNORMAL HIGH (ref 70–99)
Glucose-Capillary: 153 mg/dL — ABNORMAL HIGH (ref 70–99)

## 2024-07-18 MED ORDER — LORAZEPAM 2 MG/ML IJ SOLN
2.0000 mg | Freq: Once | INTRAMUSCULAR | Status: AC
  Administered 2024-07-18: 2 mg via INTRAVENOUS
  Filled 2024-07-18: qty 1

## 2024-07-18 MED ORDER — HALOPERIDOL LACTATE 5 MG/ML IJ SOLN
5.0000 mg | Freq: Once | INTRAMUSCULAR | Status: AC
  Administered 2024-07-18: 5 mg via INTRAVENOUS
  Filled 2024-07-18: qty 1

## 2024-07-18 NOTE — ED Triage Notes (Signed)
 Patient arrives via [facility] from work for behavioral episode. Patient was at work and patient laid down on ground. No trauma reported. Patient began thrashing around. Red in face. Wife contacted by ems, behavior intermittent episodes recently. Patient began yelling looking for wife. Given 5mg  midazolam . Calm and cooperative since. Sleepy at this time. Patient has ankle monitor on.   HR 116 BP 119/78 98 on room air -- 2L added after midazolam admin CBG 217.

## 2024-07-18 NOTE — Consult Note (Signed)
" °  The patient is a 59 year old male brought to the emergency department via EMS after consult was placed for altered mental status.  Attempted psychiatric assessment was limited due to the patient being not arousable, as he had received midazolam 5 mg prior evaluation.   Collateral information was obtained from the patient's wife and daughter, who are present at bedside.  Per wife, approximately 6 months ago, the patient began exhibiting neurological and behavioral changes including; new onset stuttering, dropping objects, increased hand tremors, and abnormal movements during sleep, describing as moving around in bed with hands on his head and making slapping motions.  Wife reports the patient has a history of diabetes mellitus and is prescribed insulin and Ozempic .  She states there has been difficulty obtaining medications from the pharmacy, and the patient has not received Ozempic  for over 7 days.  The patient is reportedly followed by neurology; however, an MRI has been unable to be completed due to the presence of the right ankle monitor.  Wife did not elaborate on the reason for the ankle monitor but states the patient is required to wear it for the next 10 years.  She reports patient was released from prison approximately 5 years ago.  The patient followed up with his primary care provider yesterday.  Per wife, the patient has no prior psychiatric history and is never been prescribed psychiatric medications.  Will defer psychiatric assessment until the patient is more alert and able to participate. "

## 2024-07-18 NOTE — ED Provider Notes (Signed)
 " [place] EMERGENCY DEPARTMENT AT [facility] Provider Note   CSN: [id number] Arrival date & time: 07/18/24  1227     Patient presents with: Altered Mental Status   Jesse Castillo is a 59 y.o. male.  Past Medical History:  Diagnosis Date   Enlarged prostate    GERD (gastroesophageal reflux disease)    Hypertension    Neuropathy    RLE   Pneumonia due to COVID-19 virus 07/2020   Restless leg    Sleep apnea    Tremor of left hand      Altered Mental Status  58 year old male presented to the ED via ambulance for jerking episode.  Patient able to provide limited history at this time due to obtunded state. Per EMS patient was at work and proceeded to lie on the ground and started thrashing his arms and legs simultaneously.  Per wife patient has had episodes of jerking of arms and legs in sleep every few days for the last 6 months.  Wife describes these episodes as hitting arms against stomach and flailing legs.  Episodes are self resolving.  Patient had head CT ordered outpatient but has yet to receive it.  Per wife no history of psychiatric disorders or substance use.  Prior to Admission medications  Medication Sig Start Date End Date Taking? Authorizing Provider  amLODipine  (NORVASC ) 5 MG tablet TAKE ONE (1) TABLET BY MOUTH EVERY DAY 09/20/23   Melvenia Manus BRAVO, MD  aspirin EC 81 MG tablet Take 81 mg by mouth daily.    [provider]  buPROPion  (WELLBUTRIN  XL) 150 MG 24 hr tablet TAKE ONE TABLET (150 MG TOTAL) BY MOUTH DAILY. 06/02/24   Bevely Doffing, FNP  cyclobenzaprine  (FLEXERIL ) 10 MG tablet TAKE ONE TABLET (10MG  TOTAL) BY MOUTH THREE TIMES DAILY AS NEEDED FOR MUSCLE SPASMS 06/30/24   Georgina Ozell LABOR, MD  diclofenac  (VOLTAREN ) 75 MG EC tablet TAKE ONE TABLET (75MG  TOTAL) BY MOUTH TWO TIMES DAILY 07/08/23   Melvenia Manus BRAVO, MD  famotidine  (PEPCID ) 40 MG tablet TAKE ONE TABLET BY MOUTH EVERY NIGHT AT BEDTIME 06/02/24   Bevely Doffing, FNP  fluticasone   (FLONASE ) 50 MCG/ACT nasal spray Place 2 sprays into both nostrils daily. 09/04/22   Sood, Vineet, MD  furosemide  (LASIX ) 80 MG tablet TAKE ONE TABLET (80MG  TOTAL) BY MOUTH TWO TIMES DAILY 10/21/23   Mallipeddi, Vishnu P, MD  gabapentin  (NEURONTIN ) 600 MG tablet TAKE ONE TABLET (600MG  TOTAL) BY MOUTH THREE TIMES DAILY 06/30/24   Bevely Doffing, FNP  HYDROcodone -acetaminophen  (NORCO) 5-325 MG tablet Take 1 tablet by mouth every 6 (six) hours as needed for moderate pain. 04/11/23   Suellen Cantor A, PA-C  hydrOXYzine  (VISTARIL ) 25 MG capsule Take 1 capsule (25 mg total) by mouth every 8 (eight) hours as needed. 08/07/23   Melvenia Manus BRAVO, MD  icosapent  Ethyl (VASCEPA ) 1 g capsule Take 2 capsules by mouth twice daily 07/10/24   Mallipeddi, Vishnu P, MD  insulin glargine  (LANTUS  SOLOSTAR) 100 UNIT/ML Solostar Pen Inject 10 Units into the skin at bedtime. 07/17/24   Bevely Doffing, FNP  Insulin Pen Needle (PEN NEEDLES) 32G X 4 MM MISC 1 each by Does not apply route daily. 08/07/23   Melvenia Manus BRAVO, MD  liraglutide  (VICTOZA ) 18 MG/3ML SOPN INJECT 0.6 MG DAILY FOR 7 DAYS, THEN INCREASE TO 1.2 MG DAILY. 07/06/24   Bevely Doffing, FNP  metFORMIN  (GLUCOPHAGE -XR) 500 MG 24 hr tablet TAKE 2 TABLETS (1000 MG TOTAL) BY MOUTH 2 (TWO)  TIMES DAILY WITH A MEAL 06/22/24   Bevely Doffing, FNP  Misc Natural Products [facility] PLUS PO) Take by mouth.    [provider]  montelukast  (SINGULAIR ) 10 MG tablet TAKE ONE TABLET (10MG  TOTAL) BY MOUTH ATBEDTIME 09/20/23   Dixon, Phillip E, MD  Multiple Vitamins-Minerals (MULTIVITAMIN MEN PO) Take by mouth.    [provider]  NEEDLE, DISP, 23 G (BD DISP NEEDLE) 23G X 1 MISC Use every 14 days with testosterone  03/08/22   Joesph Annabella HERO, FNP  ondansetron  (ZOFRAN ) 4 MG tablet Take 1 tablet (4 mg total) by mouth every 6 (six) hours. 06/27/23   Keith, Kayla N, PA-C  pantoprazole  (PROTONIX ) 40 MG tablet Take 1 tablet (40 mg total) by mouth 2 (two) times daily. 02/21/24    Bevely Doffing, FNP  potassium chloride  SA (KLOR-CON  M) 20 MEQ tablet TAKE ONE TABLET ( TOTAL) BY MOUTH DAILY 10/21/23   Mallipeddi, Vishnu P, MD  primidone  (MYSOLINE ) 50 MG tablet TAKE ONE TABLET (50MG  TOTAL) BY MOUTH FOUR TIMES DAILY. 06/30/24   Bevely Doffing, FNP  Probiotic Product (HEALTHY COLON PO) Take by mouth.    [provider]  propranolol  ER (INDERAL  LA) 60 MG 24 hr capsule TAKE ONE CAPSULE (60MG  TOTAL) BY MOUTH DAILY 07/08/23   Whitfield Raisin, NP  rOPINIRole  (REQUIP ) 4 MG tablet TAKE ONE TABLET (4MG  TOTAL) BY MOUTH AT BEDTIME 09/20/23   Melvenia Manus BRAVO, MD  sildenafil  (VIAGRA ) 100 MG tablet Take 1 tablet (100 mg total) by mouth daily as needed. 06/30/24   Matilda Senior, MD  Specialty Vitamins Products (ECHINACEA C COMPLETE PO) Take by mouth.    [provider]  SYRINGE-NEEDLE, DISP, 3 ML (B-D 3CC LUER-LOK SYR 23GX1) 23G X 1 3 ML MISC USE EVERY 14 DAYS WITH TESTOSTERONE  09/20/23   Melvenia Manus BRAVO, MD  Syringe/Needle, Disp, 18G X 1 3 ML MISC 1 Device by Does not apply route every 14 (fourteen) days. 12/12/23   Melvenia Manus BRAVO, MD  tadalafil  (CIALIS ) 10 MG tablet Take 1 tablet (10 mg total) by mouth daily as needed for erectile dysfunction. 12/26/23   Watt Rush, MD  tadalafil  (CIALIS ) 5 MG tablet Take 1 tablet (5 mg total) by mouth daily as needed for erectile dysfunction. 06/20/23   Watt Rush, MD  testosterone  cypionate (DEPOTESTOSTERONE CYPIONATE) 200 MG/ML injection Inject 1/2 mL into muscle every week 06/30/24   Matilda Senior, MD  traZODone  (DESYREL ) 150 MG tablet TAKE ONE TABLET (150MG  TOTAL) BY MOUTH AT BEDTIME 02/13/24   Bevely Doffing, FNP  vardenafil  (LEVITRA ) 20 MG tablet Take 1 tablet (20 mg total) by mouth daily as needed for erectile dysfunction. 12/26/23   Watt Rush, MD    Allergies: Mobic  [meloxicam ]    Review of Systems  Updated Vital Signs BP 133/89 (BP Location: Right Arm)   Pulse (!) 116   Temp 98.6 F (37 C) (Oral)   Resp 18    Ht 5' 11 (1.803 m)   Wt (!) 145 kg   SpO2 94%   BMI 44.58 kg/m   Physical Exam  (all labs ordered are listed, but only abnormal results are displayed) Labs Reviewed  CBC WITH DIFFERENTIAL/PLATELET - Abnormal; Notable for the following components:      Result Value   Monocytes Absolute 1.5 (*)    All other components within normal limits  COMPREHENSIVE METABOLIC PANEL WITH GFR - Abnormal; Notable for the following components:   Chloride 96 (*)    Glucose, Bld 209 (*)  AST 50 (*)    ALT 46 (*)    Anion gap 18 (*)    All other components within normal limits  I-STAT CHEM 8, ED - Abnormal; Notable for the following components:   Chloride 96 (*)    Glucose, Bld 206 (*)    Calcium, Ion 1.14 (*)    All other components within normal limits  CBG MONITORING, ED - Abnormal; Notable for the following components:   Glucose-Capillary 200 (*)    All other components within normal limits  ETHANOL  URINE DRUG SCREEN  AMMONIA  URINALYSIS, ROUTINE W REFLEX MICROSCOPIC    EKG: None  Radiology: DG Chest 1 View Result Date: 07/18/2024 CLINICAL DATA:  Altered mental status. EXAM: CHEST  1 VIEW COMPARISON:  06/27/2023. FINDINGS: Low lung volumes. The heart size and mediastinal contours are within normal limits. No focal consolidation, sizeable pleural effusion, or pneumothorax. No acute osseous abnormality. IMPRESSION: No acute cardiopulmonary findings. Electronically Signed   By: Harrietta Sherry M.D.   On: 07/18/2024 14:27    {Document cardiac monitor, telemetry assessment procedure when appropriate:32947} Procedures   Medications Ordered in the ED  LORazepam  (ATIVAN ) injection 2 mg (2 mg Intravenous Given 07/18/24 1348)  haloperidol  lactate (HALDOL ) injection 5 mg (5 mg Intravenous Given 07/18/24 1348)      {Click here for ABCD2, HEART and other calculators REFRESH Note before signing:1}                              Medical Decision Making  This patient presents to the ED for  concern of jerking episodes, this involves a number of treatment options, and is a complaint that carries with it a moderate risk of complications and morbidity. A differential diagnosis was considered for the patient's symptoms which is discussed below:   Substance use vs psychiatric condition vs intracranial pathology   Co morbidities: Discussed in HPI   Brief History:  59 year old male brought in by EMS following a behavioral jerking episode at work.  Patient is obtunded only able to provide limited history.  Per wife patient has been having jerking episodes in his sleep for the last 6 months.  Jerking episodes described as hitting hands to stomach and flailing legs in the air. Episodes are self resolving.  Patient currently being worked up for this outpatient.    EMR reviewed including pt PMHx, past surgical history and past visits to ER.   See HPI for more details   Lab Tests:   I ordered and independently interpreted labs. Labs notable for Negative ethanol   Medicines ordered:  I ordered medication including haldol  and ativan   for agitation Reevaluation of the patient after these medicines showed that the patient improved I have reviewed the patients home medicines and have made adjustments as needed  Consults/Attending Physician   I discussed this case with my attending physician who cosigned this note including patient's presenting symptoms, physical exam, and planned diagnostics and interventions. Attending physician stated agreement with plan or made changes to plan which were implemented.   Problem List / ED Course:  59 year old male brought in today by EMS for following behavioral jerking episode at work. Patient severely obtunded. Jerking episode in ED witnessed is inconsistent with seizure like activity. Patient went from obtunded state to simultaneously flailing legs and hitting stomach. Ethanol negative. CT unremarkable for intracranial  pathology.   Dispostion:  3:14 PM Care of Jesse Castillo transferred to Dr. Emil  at the end of my shift as the patient will require reassessment once labs/imaging have resulted. Patient presentation, ED course, and plan of care discussed with review of all pertinent labs and imaging. Please see his/her note for further details regarding further ED course and disposition. Plan at time of handoff is discuss proper disposition of admission vs psychiatric consult. This may be altered or completely changed at the discretion of the oncoming team pending results of further workup.     {Document critical care time when appropriate  Document review of labs and clinical decision tools ie CHADS2VASC2, etc  Document your independent review of radiology images and any outside records  Document your discussion with family members, caretakers and with consultants  Document social determinants of health affecting pt's care  Document your decision making why or why not admission, treatments were needed:32947:::1}   Final diagnoses:  None    ED Discharge Orders     None        "

## 2024-07-18 NOTE — Discharge Instructions (Signed)
 Please follow-up with your neurologist in clinic.  Please return for worsening symptoms

## 2024-07-18 NOTE — ED Provider Notes (Signed)
 Received patient in turnover from Dr. Levander.  Please see their note for further details of Hx, PE.  Briefly patient is a 59 y.o. male with a Altered Mental Status .  Patient had an where he had lost consciousness and had abnormal movements.  He had been observed in the ED for some time had had no obvious evidence of seizure-like activity had a reassuring workup.  There was some concern that patient was having trouble with his mental health.  Thought to maybe be the cause of these abnormal movements.  He is currently awaiting mental health consult.  I was notified by nursing staff that the family was becoming irritated.  I went and had a discussion with him at bedside.  I discussed the results of the test done earlier.  Family seemed a bit incredulous that would be worried about his mental health.  They want to know why he has been having shaking for the past 6 months.  They are upset that they have not yet been able to get an appointment with her neurologist.  They said they wrote a very angry epic message and have not yet heard back.  They were upset that they had not been able to figure out how to get him his Ozempic  because it sounds like he had lost one of his pens and was waiting for placement.  Patient is awake and alert on my reassessment.  He denies complaint.  Denies suicidal or homicidal ideation denies hallucinations.  I encouraged them to follow-up as an outpatient.  I will place an order in our system per protocol.      Emil Share, DO 07/18/24 1913

## 2024-07-18 NOTE — ED Notes (Signed)
 Wife at bedside.

## 2024-07-19 LAB — LIPID PANEL
Chol/HDL Ratio: 8 ratio — ABNORMAL HIGH (ref 0.0–5.0)
Cholesterol, Total: 209 mg/dL — ABNORMAL HIGH (ref 100–199)
HDL: 26 mg/dL — ABNORMAL LOW
LDL Chol Calc (NIH): 88 mg/dL (ref 0–99)
Triglycerides: 579 mg/dL (ref 0–149)
VLDL Cholesterol Cal: 95 mg/dL — ABNORMAL HIGH (ref 5–40)

## 2024-07-19 LAB — CBC WITH DIFFERENTIAL/PLATELET
Basophils Absolute: 0 x10E3/uL (ref 0.0–0.2)
Basos: 1 %
EOS (ABSOLUTE): 0.1 x10E3/uL (ref 0.0–0.4)
Eos: 1 %
Hematocrit: 45.7 % (ref 37.5–51.0)
Hemoglobin: 15.4 g/dL (ref 13.0–17.7)
Immature Grans (Abs): 0 x10E3/uL (ref 0.0–0.1)
Immature Granulocytes: 0 %
Lymphocytes Absolute: 1.8 x10E3/uL (ref 0.7–3.1)
Lymphs: 25 %
MCH: 30.9 pg (ref 26.6–33.0)
MCHC: 33.7 g/dL (ref 31.5–35.7)
MCV: 92 fL (ref 79–97)
Monocytes Absolute: 1.2 x10E3/uL — ABNORMAL HIGH (ref 0.1–0.9)
Monocytes: 17 %
Neutrophils Absolute: 4.1 x10E3/uL (ref 1.4–7.0)
Neutrophils: 56 %
Platelets: 235 x10E3/uL (ref 150–450)
RBC: 4.98 x10E6/uL (ref 4.14–5.80)
RDW: 13.2 % (ref 11.6–15.4)
WBC: 7.3 x10E3/uL (ref 3.4–10.8)

## 2024-07-19 LAB — CMP14+EGFR
ALT: 38 IU/L (ref 0–44)
AST: 34 IU/L (ref 0–40)
Albumin: 4.4 g/dL (ref 3.8–4.9)
Alkaline Phosphatase: 84 IU/L (ref 47–123)
BUN/Creatinine Ratio: 13 (ref 9–20)
BUN: 17 mg/dL (ref 6–24)
Bilirubin Total: 0.5 mg/dL (ref 0.0–1.2)
CO2: 27 mmol/L (ref 20–29)
Calcium: 9.7 mg/dL (ref 8.7–10.2)
Chloride: 93 mmol/L — ABNORMAL LOW (ref 96–106)
Creatinine, Ser: 1.27 mg/dL (ref 0.76–1.27)
Globulin, Total: 2.8 g/dL (ref 1.5–4.5)
Glucose: 209 mg/dL — ABNORMAL HIGH (ref 70–99)
Potassium: 3.9 mmol/L (ref 3.5–5.2)
Sodium: 137 mmol/L (ref 134–144)
Total Protein: 7.2 g/dL (ref 6.0–8.5)
eGFR: 66 mL/min/1.73

## 2024-07-19 LAB — B12 AND FOLATE PANEL
Folate: >20 ng/mL
Vitamin B-12: 721 pg/mL (ref 232–1245)

## 2024-07-19 LAB — MICROALBUMIN / CREATININE URINE RATIO
Creatinine, Urine: 48.4 mg/dL
Microalb/Creat Ratio: <6 mg/g{creat} (ref 0–29)
Microalbumin, Urine: <3 ug/mL

## 2024-07-19 LAB — HEMOGLOBIN A1C
Est. average glucose Bld gHb Est-mCnc: 140 mg/dL
Hgb A1c MFr Bld: 6.5 % — ABNORMAL HIGH (ref 4.8–5.6)

## 2024-07-20 ENCOUNTER — Other Ambulatory Visit: Payer: Self-pay

## 2024-07-20 ENCOUNTER — Telehealth: Payer: Self-pay | Admitting: Pharmacy Technician

## 2024-07-20 ENCOUNTER — Other Ambulatory Visit (HOSPITAL_COMMUNITY): Payer: Self-pay

## 2024-07-20 DIAGNOSIS — E1165 Type 2 diabetes mellitus with hyperglycemia: Secondary | ICD-10-CM

## 2024-07-20 MED ORDER — LIRAGLUTIDE 18 MG/3ML ~~LOC~~ SOPN: 1.2000 mg | PEN_INJECTOR | Freq: Every day | SUBCUTANEOUS | 5 refills | Status: DC

## 2024-07-20 NOTE — Telephone Encounter (Addendum)
 Pharmacy Patient Advocate Encounter   Received notification from [facility] KEY that prior authorization for lantus  is required/requested.   Insurance verification completed.   The patient is insured through [facility] MEDICAID.   Per test claim: The current 90 day co-pay is, $4.  No PA needed at this time. This test claim was processed through [facility]- copay amounts may vary at other pharmacies due to pharmacy/plan contracts, or as the patient moves through the different stages of their insurance plan.    Called the pharmacy. They will not open the box to give him less pens and they can't bill the whole box b/c the ins will only pay for up to a 90 day supply. The whole box is a 150 day supply.  He could get this from one of our [facility] [employer]. They will even mail it to him. But it appears his insurance has been updated, so I'm also working on the PA for his victoza  now.

## 2024-07-20 NOTE — Telephone Encounter (Signed)
 Pharmacy Patient Advocate Encounter  Received notification from [employer] that Prior Authorization for Victoza  has been APPROVED from 07/20/24 to 07/20/25. Ran test claim, Copay is $4. This test claim was processed through [facility]- copay amounts may vary at other pharmacies due to pharmacy/plan contracts, or as the patient moves through the different stages of their insurance plan.   PA #/Case ID/Reference #: [id number]

## 2024-07-20 NOTE — Telephone Encounter (Signed)
 It appears pt's insurance has been corrected.  Clinical questions have been answered and PA submitted. PA currently Pending. CMM KEY [id number]

## 2024-07-21 ENCOUNTER — Other Ambulatory Visit: Payer: Self-pay

## 2024-07-21 DIAGNOSIS — R4182 Altered mental status, unspecified: Secondary | ICD-10-CM | POA: Insufficient documentation

## 2024-07-21 DIAGNOSIS — G252 Other specified forms of tremor: Secondary | ICD-10-CM | POA: Insufficient documentation

## 2024-07-21 NOTE — Assessment & Plan Note (Signed)
 Pt is alert and oriented in office today.  He is able to answer questions and follow commands.  We will obtain labs and a CT of head for further evaluation of symptoms.

## 2024-07-21 NOTE — Assessment & Plan Note (Signed)
 Hyperglycemia due to Victoza  discontinuation for six days. Blood glucose elevated. Previous A1c was 6.0%. - Ordered blood work including A1c and urine specimen. - Prescribed Lantus  as temporary replacement for Victoza .

## 2024-07-21 NOTE — Assessment & Plan Note (Signed)
 Worsening action tremors and stuttering over 5-6 months. Episodes of altered mental status. Differential includes possible TIA or other neurological event. - Ordered CT scan of the head to rule out TIA or other neurological events. - Updated referral to neurology for further evaluation.

## 2024-07-22 ENCOUNTER — Other Ambulatory Visit: Payer: Self-pay

## 2024-07-22 DIAGNOSIS — R4182 Altered mental status, unspecified: Secondary | ICD-10-CM

## 2024-07-22 DIAGNOSIS — E1165 Type 2 diabetes mellitus with hyperglycemia: Secondary | ICD-10-CM

## 2024-07-22 DIAGNOSIS — I509 Heart failure, unspecified: Secondary | ICD-10-CM

## 2024-07-22 MED ORDER — LIRAGLUTIDE 18 MG/3ML ~~LOC~~ SOPN: 1.2000 mg | PEN_INJECTOR | Freq: Every day | SUBCUTANEOUS | 5 refills | Status: AC

## 2024-07-23 ENCOUNTER — Telehealth: Payer: Self-pay

## 2024-07-23 NOTE — Telephone Encounter (Signed)
 Fitness for Duty Noted Copied Scanned Original in provider box Copy at front desk   Patient says his job needs additional note from provider stating he is okay to return to work.

## 2024-07-24 ENCOUNTER — Ambulatory Visit: Payer: Self-pay

## 2024-07-24 ENCOUNTER — Telehealth: Payer: Self-pay

## 2024-07-24 NOTE — Progress Notes (Signed)
 Care Guide Pharmacy Note  07/24/2024 Name: Jesse Castillo MRN: [id number] DOB: 1965-11-03  Referred By: Bevely Doffing, FNP Reason for referral: Complex Care Management (Outreach to schedule with pharm d )   Jesse Castillo is a 59 y.o. year old male who is a primary care patient of Bevely Doffing, [place].  Jesse Castillo was referred to the pharmacist for assistance related to: CHF  Successful contact was made with the patient to discuss pharmacy services including being ready for the pharmacist to call at least 5 minutes before the scheduled appointment time and to have medication bottles and any blood pressure readings ready for review. The patient agreed to meet with the pharmacist via telephone visit on (date/time).07/30/2024  Jeoffrey Buffalo , RMA     [place]  [facility], [facility] Guide  Direct Dial: [phone]  Website: [email or web address]

## 2024-07-28 NOTE — Telephone Encounter (Signed)
Paperwork is in provider folder

## 2024-07-28 NOTE — Telephone Encounter (Signed)
 Patient called to see if he could stop by to pick up paperwork for his job. Please f/u with patient

## 2024-07-29 NOTE — Telephone Encounter (Signed)
 Call patient when complete and ready for pick up

## 2024-07-30 ENCOUNTER — Telehealth: Payer: Self-pay

## 2024-07-30 ENCOUNTER — Other Ambulatory Visit: Payer: Self-pay

## 2024-07-30 NOTE — Progress Notes (Unsigned)
" ° °  07/30/2024 Name: Jesse Castillo MRN: [id number] DOB: Sep 28, 1965  No chief complaint on file.   {Visit Type:26650}   Subjective:  Care Team: Primary Care Provider: Bevely Doffing, FNP ; Next Scheduled Visit: *** {careteamprovider:27366}  Medication Access/Adherence  Current Pharmacy:  [facility] - [place], [place] - [address] [place] [address] Phone: [phone] Fax: [phone]  [facility] [address], [place] - [address] [place] [address] [place] [address] [place] [address] Phone: [phone] Fax: [phone]  [facility] [address], [place] - [address] [place] [address] Phone: [phone] Fax: [phone]   Patient reports affordability concerns with their medications: {YES/NO:21197} Patient reports access/transportation concerns to their pharmacy: {YES/NO:21197} Patient reports adherence concerns with their medications:  {YES/NO:21197} ***   {Pharmacy S/O Choices:26420}   Objective:  Lab Results  Component Value Date   HGBA1C 6.5 (H) 07/17/2024   Lab Results  Component Value Date   HGBA1C 6.5 (H) 07/17/2024   HGBA1C 6.0 (H) 03/13/2024   HGBA1C 6.7 (H) 12/12/2023   HGBA1C 8.7 (H) 07/04/2023   HGBA1C 6.5 (A) 01/28/2023   HGBA1C 5.7 (H) 09/19/2022    Lab Results  Component Value Date   CREATININE 1.20 07/18/2024   BUN 15 07/18/2024   NA 138 07/18/2024   K 3.6 07/18/2024   CL 96 (L) 07/18/2024   CO2 22 07/18/2024    Lab Results  Component Value Date   CHOL 209 (H) 07/17/2024   HDL 26 (L) 07/17/2024   LDLCALC 88 07/17/2024   TRIG 579 (HH) 07/17/2024   CHOLHDL 8.0 (H) 07/17/2024    Medications Reviewed Today   Medications were not reviewed in this encounter       Assessment/Plan:   {Pharmacy A/P Choices:26421}  Follow Up Plan: ***  ***    "

## 2024-07-30 NOTE — Progress Notes (Signed)
" ° °  07/30/2024  Patient ID: Jesse Castillo, male   DOB: Mar 06, 1966, 59 y.o.   MRN: [id number]  Attempted to contact patient for scheduled appointment for medication management. Left HIPAA compliant message for patient to return my call at their convenience.   Lang Sieve, PharmD, BCGP Clinical Pharmacist  [phone]  "

## 2024-07-31 ENCOUNTER — Telehealth: Payer: Self-pay

## 2024-07-31 NOTE — Telephone Encounter (Signed)
 Copied from CRM #[id number]. Topic: General - Other >> Jul 31, 2024 10:02 AM Delon DASEN wrote: Reason for CRM: Patient calling to follow up on paperwork to return to work- [phone]

## 2024-08-13 ENCOUNTER — Other Ambulatory Visit

## 2024-08-14 ENCOUNTER — Inpatient Hospital Stay: Payer: Self-pay

## 2024-09-10 ENCOUNTER — Institutional Professional Consult (permissible substitution): Admitting: Neurology

## 2024-09-22 ENCOUNTER — Ambulatory Visit

## 2024-10-06 ENCOUNTER — Encounter: Admitting: Pulmonary Disease

## 2024-10-26 ENCOUNTER — Ambulatory Visit: Payer: Self-pay

## 2025-06-03 ENCOUNTER — Other Ambulatory Visit

## 2025-06-16 ENCOUNTER — Ambulatory Visit: Admitting: Urology
